# Patient Record
Sex: Female | Born: 1990 | Race: White | Hispanic: No | Marital: Single | Smoking: Never smoker
Health system: Southern US, Community
[De-identification: ages and names within clinical notes are randomized; demographics above are authoritative.]

## PROBLEM LIST (undated history)

## (undated) DIAGNOSIS — K219 Gastro-esophageal reflux disease without esophagitis: Secondary | ICD-10-CM

## (undated) DIAGNOSIS — F32A Depression, unspecified: Secondary | ICD-10-CM

## (undated) DIAGNOSIS — O219 Vomiting of pregnancy, unspecified: Secondary | ICD-10-CM

## (undated) DIAGNOSIS — F419 Anxiety disorder, unspecified: Secondary | ICD-10-CM

## (undated) DIAGNOSIS — F329 Major depressive disorder, single episode, unspecified: Secondary | ICD-10-CM

## (undated) HISTORY — DX: Gastro-esophageal reflux disease without esophagitis: K21.9

## (undated) HISTORY — PX: TONSILLECTOMY: SUR1361

## (undated) HISTORY — DX: Vomiting of pregnancy, unspecified: O21.9

## (undated) HISTORY — DX: Depression, unspecified: F32.A

## (undated) HISTORY — PX: WISDOM TOOTH EXTRACTION: SHX21

## (undated) HISTORY — DX: Anxiety disorder, unspecified: F41.9

---
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
## 2010-06-28 LAB — URINALYSIS, ROUTINE W REFLEX MICROSCOPIC
Nitrite: NEGATIVE
Specific Gravity, Urine: >1.03 — ABNORMAL HIGH (ref 1.005–1.030)
Urobilinogen, UA: 0.2 mg/dL (ref 0.0–1.0)

## 2010-06-28 LAB — PREGNANCY, URINE

## 2010-06-28 LAB — URINE MICROSCOPIC-ADD ON

## 2010-06-28 LAB — WET PREP, GENITAL

## 2010-07-01 LAB — URINALYSIS, ROUTINE W REFLEX MICROSCOPIC
Bilirubin Urine: NEGATIVE
Specific Gravity, Urine: >1.03 — ABNORMAL HIGH (ref 1.005–1.030)
Urobilinogen, UA: 0.2 mg/dL (ref 0.0–1.0)

## 2010-07-01 LAB — URINE CULTURE

## 2010-07-01 LAB — URINE MICROSCOPIC-ADD ON

## 2015-01-31 ENCOUNTER — Emergency Department (HOSPITAL_COMMUNITY)
Admission: EM | Admit: 2015-01-31 | Discharge: 2015-01-31 | Disposition: A | Discharge status: Home / Self Care | Payer: Medicaid Other | Attending: Emergency Medicine | Admitting: Emergency Medicine

## 2015-01-31 ENCOUNTER — Encounter (HOSPITAL_COMMUNITY): Payer: Self-pay | Admitting: Emergency Medicine

## 2015-01-31 DIAGNOSIS — O210 Mild hyperemesis gravidarum: Secondary | ICD-10-CM | POA: Diagnosis not present

## 2015-01-31 DIAGNOSIS — O9989 Other specified diseases and conditions complicating pregnancy, childbirth and the puerperium: Secondary | ICD-10-CM | POA: Insufficient documentation

## 2015-01-31 DIAGNOSIS — Z79899 Other long term (current) drug therapy: Secondary | ICD-10-CM | POA: Diagnosis not present

## 2015-01-31 DIAGNOSIS — Z3A09 9 weeks gestation of pregnancy: Secondary | ICD-10-CM | POA: Insufficient documentation

## 2015-01-31 DIAGNOSIS — R197 Diarrhea, unspecified: Secondary | ICD-10-CM | POA: Diagnosis not present

## 2015-01-31 LAB — URINALYSIS, ROUTINE W REFLEX MICROSCOPIC
Bilirubin Urine: NEGATIVE
Glucose, UA: NEGATIVE mg/dL
KETONES UR: 40 mg/dL — AB
NITRITE: NEGATIVE
PH: 5.5 (ref 5.0–8.0)
Protein, ur: NEGATIVE mg/dL
SPECIFIC GRAVITY, URINE: 1.01 (ref 1.005–1.030)
Urobilinogen, UA: 0.2 mg/dL (ref 0.0–1.0)

## 2015-01-31 LAB — CBC WITH DIFFERENTIAL/PLATELET
BASOS PCT: 0 %
Basophils Absolute: 0 10*3/uL (ref 0.0–0.1)
EOS ABS: 0.2 10*3/uL (ref 0.0–0.7)
Eosinophils Relative: 2 %
HCT: 41.4 % (ref 36.0–46.0)
Hemoglobin: 14.3 g/dL (ref 12.0–15.0)
Lymphocytes Relative: 21 %
Lymphs Abs: 2.8 10*3/uL (ref 0.7–4.0)
MCH: 31 pg (ref 26.0–34.0)
MCHC: 34.5 g/dL (ref 30.0–36.0)
MCV: 89.8 fL (ref 78.0–100.0)
MONO ABS: 1 10*3/uL (ref 0.1–1.0)
MONOS PCT: 8 %
NEUTROS PCT: 69 %
Neutro Abs: 9.6 10*3/uL — ABNORMAL HIGH (ref 1.7–7.7)
PLATELETS: 338 10*3/uL (ref 150–400)
RBC: 4.61 MIL/uL (ref 3.87–5.11)
RDW: 12.5 % (ref 11.5–15.5)
WBC: 13.7 10*3/uL — ABNORMAL HIGH (ref 4.0–10.5)

## 2015-01-31 LAB — COMPREHENSIVE METABOLIC PANEL
ALBUMIN: 4.6 g/dL (ref 3.5–5.0)
ALT: 12 U/L — ABNORMAL LOW (ref 14–54)
ANION GAP: 11 (ref 5–15)
AST: 17 U/L (ref 15–41)
Alkaline Phosphatase: 43 U/L (ref 38–126)
BUN: 9 mg/dL (ref 6–20)
CO2: 21 mmol/L — AB (ref 22–32)
Calcium: 9.3 mg/dL (ref 8.9–10.3)
Chloride: 105 mmol/L (ref 101–111)
Creatinine, Ser: 0.74 mg/dL (ref 0.44–1.00)
GFR calc Af Amer: >60 mL/min (ref >60)
GFR calc non Af Amer: >60 mL/min (ref >60)
GLUCOSE: 95 mg/dL (ref 65–99)
POTASSIUM: 3.2 mmol/L — AB (ref 3.5–5.1)
SODIUM: 137 mmol/L (ref 135–145)
Total Bilirubin: 0.8 mg/dL (ref 0.3–1.2)
Total Protein: 7.3 g/dL (ref 6.5–8.1)

## 2015-01-31 LAB — POC URINE PREG, ED: PREG TEST UR: POSITIVE — AB

## 2015-01-31 LAB — URINE MICROSCOPIC-ADD ON

## 2015-01-31 MED ORDER — ONDANSETRON HCL 4 MG/2ML IJ SOLN
4.0000 mg | Freq: Once | INTRAMUSCULAR | Status: AC
  Administered 2015-01-31: 4 mg via INTRAVENOUS
  Filled 2015-01-31: qty 2

## 2015-01-31 MED ORDER — PROMETHAZINE HCL 25 MG/ML IJ SOLN
12.5000 mg | Freq: Once | INTRAMUSCULAR | Status: AC
  Administered 2015-01-31: 12.5 mg via INTRAVENOUS
  Filled 2015-01-31: qty 1

## 2015-01-31 MED ORDER — PROMETHAZINE HCL 25 MG PO TABS
25.0000 mg | ORAL_TABLET | Freq: Four times a day (QID) | ORAL | Status: DC | PRN
Start: 2015-01-31 — End: 2015-02-04

## 2015-01-31 MED ORDER — SODIUM CHLORIDE 0.9 % IV BOLUS (SEPSIS)
2000.0000 mL | Freq: Once | INTRAVENOUS | Status: AC
  Administered 2015-01-31: 2000 mL via INTRAVENOUS

## 2015-01-31 NOTE — ED Notes (Signed)
Pt vomiting at this time 

## 2015-01-31 NOTE — ED Notes (Signed)
Discharge instructions given, pt demonstrated teach back and verbal understanding. No concerns voiced.  

## 2015-01-31 NOTE — Discharge Instructions (Signed)
[facility]

## 2015-01-31 NOTE — ED Provider Notes (Signed)
CSN: [id number]     Arrival date & time 01/31/15  1726 History   First MD Initiated Contact with Patient 01/31/15 1733     Chief Complaint  Patient presents with  . Emesis  . Diarrhea     (Consider location/radiation/quality/duration/timing/severity/associated sxs/prior Treatment) Patient is a 24 y.o. female presenting with vomiting and diarrhea. The history is provided by the patient (The patient states that she's been vomiting for couple days and having a little bit of diarrhea. She states she's pregnant and has not had a period for 2 months.).  Emesis Severity:  Moderate Timing:  Constant Quality:  Bilious material Able to tolerate:  Liquids Progression:  Unchanged Chronicity:  New Recent urination:  Decreased Associated symptoms: diarrhea   Associated symptoms: no abdominal pain and no headaches   Diarrhea Associated symptoms: vomiting   Associated symptoms: no abdominal pain and no headaches     History reviewed. No pertinent past medical history. Past Surgical History  Procedure Laterality Date  . Tonsillectomy     History reviewed. No pertinent family history. Social History  Substance Use Topics  . Smoking status: Never Smoker   . Smokeless tobacco: None  . Alcohol Use: No   OB History    Gravida Para Term Preterm AB TAB SAB Ectopic Multiple Living   1              Review of Systems  Constitutional: Negative for appetite change and fatigue.  HENT: Negative for congestion, ear discharge and sinus pressure.   Eyes: Negative for discharge.  Respiratory: Negative for cough.   Cardiovascular: Negative for chest pain.  Gastrointestinal: Positive for vomiting and diarrhea. Negative for abdominal pain.  Genitourinary: Negative for frequency and hematuria.  Musculoskeletal: Negative for back pain.  Skin: Negative for rash.  Neurological: Negative for seizures and headaches.  Psychiatric/Behavioral: Negative for hallucinations.      Allergies  Review of  patient's allergies indicates no known allergies.  Home Medications   Prior to Admission medications   Medication Sig Start Date End Date Taking? Authorizing Provider  ranitidine (ZANTAC) 150 MG tablet Take 150 mg by mouth 2 (two) times daily.   Yes Historical Provider, MD  promethazine (PHENERGAN) 25 MG tablet Take 1 tablet (25 mg total) by mouth every 6 (six) hours as needed for nausea or vomiting. 01/31/15   Bethann BerkshireJoseph Lavern Maslow, MD   BP 114/66 mmHg  Pulse 102  Temp(Src) 97.7 F (36.5 C) (Oral)  Resp 18  Ht 5\' 6"  (1.676 m)  Wt 130 lb (58.968 kg)  BMI 20.99 kg/m2  SpO2 98%  LMP 11/21/2014 Physical Exam  Constitutional: She is oriented to person, place, and time. She appears well-developed.  HENT:  Head: Normocephalic.  Eyes: Conjunctivae and EOM are normal. No scleral icterus.  Neck: Neck supple. No thyromegaly present.  Cardiovascular: Normal rate and regular rhythm.  Exam reveals no gallop and no friction rub.   No murmur heard. Pulmonary/Chest: No stridor. She has no wheezes. She has no rales. She exhibits no tenderness.  Abdominal: She exhibits no distension. There is no tenderness. There is no rebound.  Musculoskeletal: Normal range of motion. She exhibits no edema.  Lymphadenopathy:    She has no cervical adenopathy.  Neurological: She is oriented to person, place, and time. She exhibits normal muscle tone. Coordination normal.  Skin: No rash noted. No erythema.  Psychiatric: She has a normal mood and affect. Her behavior is normal.    ED Course  Procedures (including critical care  time) Labs Review Labs Reviewed  CBC WITH DIFFERENTIAL/PLATELET - Abnormal; Notable for the following:    WBC 13.7 (*)    Neutro Abs 9.6 (*)    All other components within normal limits  COMPREHENSIVE METABOLIC PANEL - Abnormal; Notable for the following:    Potassium 3.2 (*)    CO2 21 (*)    ALT 12 (*)    All other components within normal limits  URINALYSIS, ROUTINE W REFLEX MICROSCOPIC  (NOT AT [facility]) - Abnormal; Notable for the following:    Hgb urine dipstick TRACE (*)    Ketones, ur 40 (*)    Leukocytes, UA TRACE (*)    All other components within normal limits  URINE MICROSCOPIC-ADD ON - Abnormal; Notable for the following:    Squamous Epithelial / LPF FEW (*)    All other components within normal limits  POC URINE PREG, ED - Abnormal; Notable for the following:    Preg Test, Ur POSITIVE (*)    All other components within normal limits    Imaging Review No results found. I have personally reviewed and evaluated these images and lab results as part of my medical decision-making.   EKG Interpretation None      MDM   Final diagnoses:  Hyperemesis gravidarum    Patient is pregnant and moderately dehydrated. She has been hydrated with 2 L of normal saline and is feeling better. [facility] and was given a few Phenergan to use as needed for nausea    Bethann Berkshire, MD 01/31/15 2042

## 2015-01-31 NOTE — ED Notes (Signed)
Pt having n/d since yesterday.  Pt says she is pregnant but having not been confirmed.  Vomited 8- 10 times in last 24 hours and watery loose stools, 5 times in last 24 hours.  Denies nay pain at this time.

## 2015-01-31 NOTE — ED Notes (Signed)
MD Zammit at bedside. 

## 2015-02-04 ENCOUNTER — Emergency Department (HOSPITAL_COMMUNITY)
Admission: EM | Admit: 2015-02-04 | Discharge: 2015-02-04 | Disposition: A | Discharge status: Home / Self Care | Payer: Medicaid Other | Attending: Emergency Medicine | Admitting: Emergency Medicine

## 2015-02-04 ENCOUNTER — Encounter (HOSPITAL_COMMUNITY): Payer: Self-pay | Admitting: *Deleted

## 2015-02-04 DIAGNOSIS — O210 Mild hyperemesis gravidarum: Secondary | ICD-10-CM | POA: Diagnosis not present

## 2015-02-04 DIAGNOSIS — Z3A10 10 weeks gestation of pregnancy: Secondary | ICD-10-CM | POA: Diagnosis not present

## 2015-02-04 DIAGNOSIS — Z79899 Other long term (current) drug therapy: Secondary | ICD-10-CM | POA: Insufficient documentation

## 2015-02-04 LAB — BASIC METABOLIC PANEL
ANION GAP: 6 (ref 5–15)
BUN: 9 mg/dL (ref 6–20)
CHLORIDE: 104 mmol/L (ref 101–111)
CO2: 25 mmol/L (ref 22–32)
CREATININE: 0.71 mg/dL (ref 0.44–1.00)
Calcium: 9.3 mg/dL (ref 8.9–10.3)
GFR calc non Af Amer: >60 mL/min (ref >60)
Glucose, Bld: 99 mg/dL (ref 65–99)
POTASSIUM: 3.6 mmol/L (ref 3.5–5.1)
SODIUM: 135 mmol/L (ref 135–145)

## 2015-02-04 LAB — CBC
HEMATOCRIT: 42.5 % (ref 36.0–46.0)
HEMOGLOBIN: 14.9 g/dL (ref 12.0–15.0)
MCH: 31.7 pg (ref 26.0–34.0)
MCHC: 35.1 g/dL (ref 30.0–36.0)
MCV: 90.4 fL (ref 78.0–100.0)
Platelets: 307 10*3/uL (ref 150–400)
RBC: 4.7 MIL/uL (ref 3.87–5.11)
RDW: 12.4 % (ref 11.5–15.5)
WBC: 12.9 10*3/uL — AB (ref 4.0–10.5)

## 2015-02-04 LAB — URINALYSIS, ROUTINE W REFLEX MICROSCOPIC
Bilirubin Urine: NEGATIVE
Glucose, UA: NEGATIVE mg/dL
Ketones, ur: 15 mg/dL — AB
NITRITE: NEGATIVE
PROTEIN: NEGATIVE mg/dL
SPECIFIC GRAVITY, URINE: 1.02 (ref 1.005–1.030)
UROBILINOGEN UA: 0.2 mg/dL (ref 0.0–1.0)
pH: 8 (ref 5.0–8.0)

## 2015-02-04 LAB — PREGNANCY, URINE: PREG TEST UR: POSITIVE — AB

## 2015-02-04 LAB — URINE MICROSCOPIC-ADD ON

## 2015-02-04 MED ORDER — SODIUM CHLORIDE 0.9 % IV BOLUS (SEPSIS)
1000.0000 mL | Freq: Once | INTRAVENOUS | Status: AC
  Administered 2015-02-04: 1000 mL via INTRAVENOUS

## 2015-02-04 MED ORDER — PROMETHAZINE HCL 25 MG RE SUPP: 25.0000 mg | Freq: Four times a day (QID) | RECTAL | Status: DC | PRN

## 2015-02-04 MED ORDER — METOCLOPRAMIDE HCL 5 MG/ML IJ SOLN
10.0000 mg | Freq: Once | INTRAMUSCULAR | Status: AC
  Administered 2015-02-04: 10 mg via INTRAVENOUS
  Filled 2015-02-04: qty 2

## 2015-02-04 NOTE — Discharge Instructions (Signed)
Return to the ED with any concerns including vomiting and not able to keep down liquids, abdominal pain, vaginal bleeding, decreased level of alertness/lethargy, or any other alarming symptoms

## 2015-02-04 NOTE — ED Notes (Signed)
Vomiting onset this past week. Patient is pregnant

## 2015-02-04 NOTE — ED Notes (Signed)
Crackers and gingerale given.  Pt states she feels better.  States she is hungry.

## 2015-02-04 NOTE — ED Provider Notes (Signed)
CSN: [id number]     Arrival date & time 02/04/15  8657 History  By signing my name below, I, Soijett Blue, attest that this documentation has been prepared under the direction and in the presence of Jerelyn Scott, MD. Electronically Signed: Soijett Blue, ED Scribe. 02/04/2015. 9:39 AM.   Chief Complaint  Patient presents with  . Emesis During Pregnancy     Patient is a 24 y.o. female presenting with vomiting. The history is provided by the patient. No language interpreter was used.  Emesis Severity:  Moderate Duration:  1 week Timing:  Constant Able to tolerate:  Liquids Progression:  Unchanged Chronicity:  New Relieved by:  Nothing Worsened by:  Nothing tried Associated symptoms: no abdominal pain   Risk factors: pregnant now     HPI Comments: Jeanne Reed is a 24 y.o. female  who presents to the Emergency Department complaining of constant emesis during pregnancy onset 1 week. She thinks that she is [redacted] weeks pregnant but she has not been seen by a OB-GYN. She notes that she made an appointment with health department to be seen and then from there she plans to go to [facility]. She reports that she has been seen for her symptoms on 01/31/15 and was Rx phenergan that she has had trouble keeping down due to her nausea and vomiting. She notes that this is her first pregnancy. Patient's last menstrual period was 11/21/2014. She states that she is having associated symptoms of nausea. She states that she has tried Rx phenergan with no relief for her symptoms. She denies any other symptoms.Denies allergies to any medications.     History reviewed. No pertinent past medical history. Past Surgical History  Procedure Laterality Date  . Tonsillectomy     No family history on file. Social History  Substance Use Topics  . Smoking status: Never Smoker   . Smokeless tobacco: None  . Alcohol Use: No   OB History    Gravida Para Term Preterm AB TAB SAB Ectopic Multiple Living   1               Review of Systems  Gastrointestinal: Positive for vomiting. Negative for abdominal pain.  All other systems reviewed and are negative.     Allergies  Review of patient's allergies indicates no known allergies.  Home Medications   Prior to Admission medications   Medication Sig Start Date End Date Taking? Authorizing Provider  ranitidine (ZANTAC) 150 MG tablet Take 150 mg by mouth 2 (two) times daily.   Yes Historical Provider, MD  promethazine (PHENERGAN) 25 MG suppository Place 1 suppository (25 mg total) rectally every 6 (six) hours as needed for nausea or vomiting. 02/04/15   Jerelyn Scott, MD   BP 103/62 mmHg  Pulse 85  Temp(Src) 98.1 F (36.7 C) (Oral)  Resp 16  Ht  (1.676 m)  Wt 130 lb (58.968 kg)  BMI 20.99 kg/m2  SpO2 98%  LMP 11/21/2014  Vitals reviewed Physical Exam  Physical Examination: General appearance - alert, well appearing, and in no distress Mental status - alert, oriented to person, place, and time Eyes - no conjunctival injection no scleral icterus Mouth - mucous membranes moist, pharynx normal without lesions Chest - clear to auscultation, no wheezes, rales or rhonchi, symmetric air entry Heart - normal rate, regular rhythm, normal S1, S2, no murmurs, rubs, clicks or gallops Abdomen - soft, nontender, nondistended, no masses or organomegaly Neurological - alert, oriented, normal speech Extremities -  peripheral pulses normal, no pedal edema, no clubbing or cyanosis Skin - normal coloration and turgor, no rashes  ED Course  Procedures (including critical care time) DIAGNOSTIC STUDIES: Oxygen Saturation is 100% on RA, nl by my interpretation.    COORDINATION OF CARE: 9:36 AM Discussed treatment plan with pt at bedside which includes IV fluids, reglan injection, labs and UA and pt agreed to plan.    Labs Review Labs Reviewed  URINALYSIS, ROUTINE W REFLEX MICROSCOPIC (NOT AT [facility]) - Abnormal; Notable for the following:     APPearance HAZY (*)    Hgb urine dipstick TRACE (*)    Ketones, ur 15 (*)    Leukocytes, UA TRACE (*)    All other components within normal limits  PREGNANCY, URINE - Abnormal; Notable for the following:    Preg Test, Ur POSITIVE (*)    All other components within normal limits  CBC - Abnormal; Notable for the following:    WBC 12.9 (*)    All other components within normal limits  URINE MICROSCOPIC-ADD ON - Abnormal; Notable for the following:    Squamous Epithelial / LPF FEW (*)    Bacteria, UA FEW (*)    All other components within normal limits  BASIC METABOLIC PANEL    Imaging Review No results found. I have personally reviewed and evaluated these lab results as part of my medical decision-making.   EKG Interpretation None      MDM   Final diagnoses:  Hyperemesis gravidarum    Pt presenting with c/o vomiting, she is in early pregnancy- no abdominal pain or vaginal bleeding present.  Pt with 15 ketones in urine, no UTI.  Doubt ectopic pregnancy given no pain or bleeding.  Pt feels much improved after IV fluids and antiemetics.  Had been prescribed phenergan at last visit, but is having difficulty keeping this down, given rx for phenergan suppositories.  She states she has appointment at health department and [facility]  She was able to tolerate a po trial prior to discharge. Discharged with strict return precautions.  Pt agreeable with plan.  Julious PayerI, LINKER,MARTHA K, personally performed the services described in this documentation. All medical record entries made by the scribe were at my direction and in my presence.  I have reviewed the chart and discharge instructions and agree that the record reflects my personal performance and is accurate and complete. Ethelda ChickLINKER,MARTHA K.  02/04/2015. 12:49 PM.     Jerelyn ScottMartha Linker, MD 02/04/15 1249

## 2015-02-07 ENCOUNTER — Emergency Department (HOSPITAL_COMMUNITY)
Admission: EM | Admit: 2015-02-07 | Discharge: 2015-02-07 | Disposition: A | Discharge status: Home / Self Care | Payer: Medicaid Other | Attending: Emergency Medicine | Admitting: Emergency Medicine

## 2015-02-07 ENCOUNTER — Encounter (HOSPITAL_COMMUNITY): Payer: Self-pay

## 2015-02-07 DIAGNOSIS — R197 Diarrhea, unspecified: Secondary | ICD-10-CM | POA: Diagnosis not present

## 2015-02-07 DIAGNOSIS — O9989 Other specified diseases and conditions complicating pregnancy, childbirth and the puerperium: Secondary | ICD-10-CM | POA: Insufficient documentation

## 2015-02-07 DIAGNOSIS — O211 Hyperemesis gravidarum with metabolic disturbance: Secondary | ICD-10-CM | POA: Insufficient documentation

## 2015-02-07 DIAGNOSIS — Z3490 Encounter for supervision of normal pregnancy, unspecified, unspecified trimester: Secondary | ICD-10-CM

## 2015-02-07 DIAGNOSIS — Z3A10 10 weeks gestation of pregnancy: Secondary | ICD-10-CM | POA: Insufficient documentation

## 2015-02-07 DIAGNOSIS — Z79899 Other long term (current) drug therapy: Secondary | ICD-10-CM | POA: Diagnosis not present

## 2015-02-07 DIAGNOSIS — M549 Dorsalgia, unspecified: Secondary | ICD-10-CM | POA: Insufficient documentation

## 2015-02-07 DIAGNOSIS — O210 Mild hyperemesis gravidarum: Secondary | ICD-10-CM | POA: Diagnosis present

## 2015-02-07 DIAGNOSIS — E860 Dehydration: Secondary | ICD-10-CM

## 2015-02-07 DIAGNOSIS — R1110 Vomiting, unspecified: Secondary | ICD-10-CM

## 2015-02-07 LAB — URINALYSIS, ROUTINE W REFLEX MICROSCOPIC
Bilirubin Urine: NEGATIVE
Glucose, UA: NEGATIVE mg/dL
KETONES UR: 15 mg/dL — AB
NITRITE: NEGATIVE
PH: 6 (ref 5.0–8.0)
Protein, ur: NEGATIVE mg/dL
Specific Gravity, Urine: 1.02 (ref 1.005–1.030)
Urobilinogen, UA: 0.2 mg/dL (ref 0.0–1.0)

## 2015-02-07 LAB — COMPREHENSIVE METABOLIC PANEL
ALBUMIN: 4.6 g/dL (ref 3.5–5.0)
ALT: 13 U/L — ABNORMAL LOW (ref 14–54)
ANION GAP: 8 (ref 5–15)
AST: 15 U/L (ref 15–41)
Alkaline Phosphatase: 47 U/L (ref 38–126)
BUN: 11 mg/dL (ref 6–20)
CHLORIDE: 104 mmol/L (ref 101–111)
CO2: 25 mmol/L (ref 22–32)
Calcium: 9.4 mg/dL (ref 8.9–10.3)
Creatinine, Ser: 0.69 mg/dL (ref 0.44–1.00)
GFR calc Af Amer: >60 mL/min (ref >60)
GFR calc non Af Amer: >60 mL/min (ref >60)
Glucose, Bld: 90 mg/dL (ref 65–99)
POTASSIUM: 3.6 mmol/L (ref 3.5–5.1)
SODIUM: 137 mmol/L (ref 135–145)
TOTAL PROTEIN: 7.5 g/dL (ref 6.5–8.1)
Total Bilirubin: 0.8 mg/dL (ref 0.3–1.2)

## 2015-02-07 LAB — CBC WITH DIFFERENTIAL/PLATELET
BASOS ABS: 0 10*3/uL (ref 0.0–0.1)
BASOS PCT: 0 %
EOS ABS: 0.2 10*3/uL (ref 0.0–0.7)
Eosinophils Relative: 1 %
HCT: 42 % (ref 36.0–46.0)
Hemoglobin: 14.5 g/dL (ref 12.0–15.0)
Lymphocytes Relative: 15 %
Lymphs Abs: 2.3 10*3/uL (ref 0.7–4.0)
MCH: 31.3 pg (ref 26.0–34.0)
MCHC: 34.5 g/dL (ref 30.0–36.0)
MCV: 90.5 fL (ref 78.0–100.0)
MONO ABS: 1.2 10*3/uL — AB (ref 0.1–1.0)
MONOS PCT: 8 %
Neutro Abs: 11.2 10*3/uL — ABNORMAL HIGH (ref 1.7–7.7)
Neutrophils Relative %: 76 %
PLATELETS: 316 10*3/uL (ref 150–400)
RBC: 4.64 MIL/uL (ref 3.87–5.11)
RDW: 12.6 % (ref 11.5–15.5)
WBC: 14.9 10*3/uL — ABNORMAL HIGH (ref 4.0–10.5)

## 2015-02-07 LAB — URINE MICROSCOPIC-ADD ON

## 2015-02-07 MED ORDER — SODIUM CHLORIDE 0.9 % IV BOLUS (SEPSIS)
1000.0000 mL | Freq: Once | INTRAVENOUS | Status: AC
  Administered 2015-02-07: 1000 mL via INTRAVENOUS

## 2015-02-07 MED ORDER — PROMETHAZINE HCL 25 MG/ML IJ SOLN
12.5000 mg | Freq: Once | INTRAMUSCULAR | Status: AC
  Administered 2015-02-07: 12.5 mg via INTRAVENOUS
  Filled 2015-02-07: qty 1

## 2015-02-07 NOTE — ED Notes (Signed)
Pt reports n/v/d x 1 week.  Pt says went to the health dept today and was told she is approx [redacted] weeks pregnant.  Sent here for dehydration.

## 2015-02-07 NOTE — ED Provider Notes (Signed)
CSN: [id number]     Arrival date & time 02/07/15  1638 History  By signing my name below, I, Jeanne Reed, attest that this documentation has been prepared under the direction and in the presence of Jeanne Rhineonald Conway Fedora, MD. Electronically Signed: [name], ED Scribe. 02/07/2015. 8:29 PM.    Chief Complaint  Patient presents with  . Emesis  . Dehydration   Patient is a 24 y.o. female presenting with vomiting. The history is provided by the patient. No language interpreter was used.  Emesis Severity:  Moderate Duration:  1 week Timing:  Intermittent Able to tolerate:  Solids and liquids Progression:  Unchanged Chronicity:  Recurrent Recent urination:  Normal Associated symptoms: chills and diarrhea   Risk factors: pregnant now   Risk factors: no diabetes, no sick contacts and no travel to endemic areas    HPI Comments: Jeanne Reed is a 24 y.o. female who is [redacted] weeks pregnant and presents to the Emergency Department complaining of emesis and dehydration onset over 1 week ago. She reports associated chills and back pain. She notes she was able to eat breakfast (banana and sandwich) today without having to vomit afterwards. Pt states she has her first appointment with her OBGYN next week. She denies recent travel, sick contacts, or antibiotics. She notes that the last time she was in the ED for this (10/22) she was given phenergan with moderate relief. Pt denies vaginal bleeding and dysuria.   PMH - none Past Surgical History  Procedure Laterality Date  . Tonsillectomy     No family history on file. Social History  Substance Use Topics  . Smoking status: Never Smoker   . Smokeless tobacco: None  . Alcohol Use: No   OB History    Gravida Para Term Preterm AB TAB SAB Ectopic Multiple Living   1              Review of Systems  Constitutional: Positive for chills.  Gastrointestinal: Positive for nausea, vomiting and diarrhea.  Genitourinary: Negative for dysuria and vaginal  bleeding.  Musculoskeletal: Positive for back pain.  All other systems reviewed and are negative.   Allergies  Review of patient's allergies indicates no known allergies.  Home Medications   Prior to Admission medications   Medication Sig Start Date End Date Taking? Authorizing Provider  promethazine (PHENERGAN) 25 MG suppository Place 1 suppository (25 mg total) rectally every 6 (six) hours as needed for nausea or vomiting. 02/04/15   Jerelyn ScottMartha Linker, MD  ranitidine (ZANTAC) 150 MG tablet Take 150 mg by mouth 2 (two) times daily.    Historical Provider, MD   BP 107/64 mmHg  Pulse 111  Temp(Src) 98.1 F (36.7 C) (Oral)  Resp 20  Ht 5\' 6"  (1.676 m)  Wt 129 lb (58.514 kg)  BMI 20.83 kg/m2  SpO2 100%  LMP 11/21/2014 Physical Exam CONSTITUTIONAL: Well developed/well nourished HEAD: Normocephalic/atraumatic EYES: EOMI/PERRL ENMT: Mucous membranes moist NECK: supple no meningeal signs SPINE/BACK:entire spine nontender CV: S1/S2 noted, no murmurs/rubs/gallops noted LUNGS: Lungs are clear to auscultation bilaterally, no apparent distress ABDOMEN: soft, nontender, no rebound or guarding, bowel sounds noted throughout abdomen GU:no cva tenderness NEURO: Pt is awake/alert/appropriate, moves all extremitiesx4.  No facial droop.   EXTREMITIES: pulses normal/equal, full ROM SKIN: warm, color normal PSYCH: no abnormalities of mood noted, alert and oriented to situation  ED Course  Procedures  EMERGENCY DEPARTMENT US PREGNANCY "Study: Limited Ultrasound of the Pelvis"  INDICATIONS:Pregnancy(required) Multiple views of the uterus and pelvic  cavity are obtained with a multi-frequency probe.  APPROACH:Transabdominal   PERFORMED BY: Myself  IMAGES ARCHIVED?: Yes  LIMITATIONS: Body habitus  PREGNANCY FREE FLUID: None  PREGNANCY UTERUS FINDINGS:Gestational sac noted   PREGNANCY FINDINGS: Yolk sac noted and Fetal heart activity seen  INTERPRETATION: Viable intrauterine  pregnancy  GESTATIONAL AGE, ESTIMATE: 8 weeks      DIAGNOSTIC STUDIES: Oxygen Saturation is 100% on RA, normal by my interpretation.    COORDINATION OF CARE: 8:18 PM - Discussed lab results. Discussed plans to wait on diagnostic studies and anti-nausea medication. Pt advised of plan for treatment and pt agrees.  Labs Review Labs Reviewed  CBC WITH DIFFERENTIAL/PLATELET - Abnormal; Notable for the following:    WBC 14.9 (*)    Neutro Abs 11.2 (*)    Monocytes Absolute 1.2 (*)    All other components within normal limits  COMPREHENSIVE METABOLIC PANEL - Abnormal; Notable for the following:    ALT 13 (*)    All other components within normal limits  URINALYSIS, ROUTINE W REFLEX MICROSCOPIC (NOT AT [facility]) - Abnormal; Notable for the following:    APPearance TURBID (*)    Hgb urine dipstick SMALL (*)    Ketones, ur 15 (*)    Leukocytes, UA SMALL (*)    All other components within normal limits  URINE MICROSCOPIC-ADD ON - Abnormal; Notable for the following:    Squamous Epithelial / LPF MANY (*)    Bacteria, UA MANY (*)    All other components within normal limits  URINE CULTURE    I have personally reviewed and evaluated these lab results as part of my medical decision-making.     Pt well appearing No significant abd tenderness No vomiting or diarrhea here She feels improved with IV fluids She has OB f/u tomorrow Limited bedside US shows early IUP I feel she is safe for d/c home May need to collect stool samples at home due to persistent diarrhea >1 week Denies dysuria and urine culture sent will defer antibiotics  MDM   Final diagnoses:  Vomiting and diarrhea  Dehydration  Pregnancy    Nursing notes including past medical history and social history reviewed and considered in documentation Labs/vital reviewed myself and considered during evaluation   I personally performed the services described in this documentation, which was scribed in my presence. The  recorded information has been reviewed and is accurate.       Jeanne Rhine, MD 02/07/15 [phone]

## 2015-02-08 ENCOUNTER — Ambulatory Visit (INDEPENDENT_AMBULATORY_CARE_PROVIDER_SITE_OTHER): Payer: Medicaid Other | Admitting: Adult Health

## 2015-02-08 ENCOUNTER — Ambulatory Visit (INDEPENDENT_AMBULATORY_CARE_PROVIDER_SITE_OTHER): Payer: Medicaid Other

## 2015-02-08 ENCOUNTER — Other Ambulatory Visit (HOSPITAL_COMMUNITY)
Admission: RE | Admit: 2015-02-08 | Discharge: 2015-02-08 | Disposition: A | Discharge status: Home / Self Care | Payer: Medicaid Other | Source: Ambulatory Visit | Attending: Adult Health | Admitting: Adult Health

## 2015-02-08 ENCOUNTER — Encounter: Payer: Self-pay | Admitting: Adult Health

## 2015-02-08 VITALS — BP 110/60 | HR 86 | Wt 132.5 lb

## 2015-02-08 DIAGNOSIS — O3680X0 Pregnancy with inconclusive fetal viability, not applicable or unspecified: Secondary | ICD-10-CM | POA: Diagnosis not present

## 2015-02-08 DIAGNOSIS — Z3481 Encounter for supervision of other normal pregnancy, first trimester: Secondary | ICD-10-CM

## 2015-02-08 DIAGNOSIS — Z3400 Encounter for supervision of normal first pregnancy, unspecified trimester: Secondary | ICD-10-CM | POA: Insufficient documentation

## 2015-02-08 DIAGNOSIS — Z01411 Encounter for gynecological examination (general) (routine) with abnormal findings: Secondary | ICD-10-CM | POA: Insufficient documentation

## 2015-02-08 DIAGNOSIS — Z113 Encounter for screening for infections with a predominantly sexual mode of transmission: Secondary | ICD-10-CM | POA: Insufficient documentation

## 2015-02-08 DIAGNOSIS — Z331 Pregnant state, incidental: Secondary | ICD-10-CM

## 2015-02-08 DIAGNOSIS — Z369 Encounter for antenatal screening, unspecified: Secondary | ICD-10-CM

## 2015-02-08 DIAGNOSIS — O219 Vomiting of pregnancy, unspecified: Secondary | ICD-10-CM | POA: Diagnosis not present

## 2015-02-08 DIAGNOSIS — Z0283 Encounter for blood-alcohol and blood-drug test: Secondary | ICD-10-CM

## 2015-02-08 DIAGNOSIS — Z1389 Encounter for screening for other disorder: Secondary | ICD-10-CM

## 2015-02-08 DIAGNOSIS — Z3491 Encounter for supervision of normal pregnancy, unspecified, first trimester: Secondary | ICD-10-CM

## 2015-02-08 HISTORY — DX: Vomiting of pregnancy, unspecified: O21.9

## 2015-02-08 LAB — POCT URINALYSIS DIPSTICK
Glucose, UA: NEGATIVE
Leukocytes, UA: NEGATIVE
Nitrite, UA: NEGATIVE
Protein, UA: NEGATIVE

## 2015-02-08 NOTE — Patient Instructions (Signed)
First Trimester of Pregnancy The first trimester of pregnancy is from week 1 until the end of week 12 (months 1 through 3). A week after a sperm fertilizes an egg, the egg will implant on the wall of the uterus. This embryo will begin to develop into a baby. Genes from you and your partner are forming the baby. The female genes determine whether the baby is a boy or a girl. At 6-8 weeks, the eyes and face are formed, and the heartbeat can be seen on ultrasound. At the end of 12 weeks, all the baby's organs are formed.  Now that you are pregnant, you will want to do everything you can to have a healthy baby. Two of the most important things are to get good prenatal care and to follow your health care provider's instructions. Prenatal care is all the medical care you receive before the baby's birth. This care will help prevent, find, and treat any problems during the pregnancy and childbirth. BODY CHANGES Your body goes through many changes during pregnancy. The changes vary from woman to woman.   You may gain or lose a couple of pounds at first.  You may feel sick to your stomach (nauseous) and throw up (vomit). If the vomiting is uncontrollable, call your health care provider.  You may tire easily.  You may develop headaches that can be relieved by medicines approved by your health care provider.  You may urinate more often. Painful urination may mean you have a bladder infection.  You may develop heartburn as a result of your pregnancy.  You may develop constipation because certain hormones are causing the muscles that push waste through your intestines to slow down.  You may develop hemorrhoids or swollen, bulging veins (varicose veins).  Your breasts may begin to grow larger and become tender. Your nipples may stick out more, and the tissue that surrounds them (areola) may become darker.  Your gums may bleed and may be sensitive to brushing and flossing.  Dark spots or blotches (chloasma,  mask of pregnancy) may develop on your face. This will likely fade after the baby is born.  Your menstrual periods will stop.  You may have a loss of appetite.  You may develop cravings for certain kinds of food.  You may have changes in your emotions from day to day, such as being excited to be pregnant or being concerned that something may go wrong with the pregnancy and baby.  You may have more vivid and strange dreams.  You may have changes in your hair. These can include thickening of your hair, rapid growth, and changes in texture. Some women also have hair loss during or after pregnancy, or hair that feels dry or thin. Your hair will most likely return to normal after your baby is born. WHAT TO EXPECT AT YOUR PRENATAL VISITS During a routine prenatal visit:  You will be weighed to make sure you and the baby are growing normally.  Your blood pressure will be taken.  Your abdomen will be measured to track your baby's growth.  The fetal heartbeat will be listened to starting around week 10 or 12 of your pregnancy.  Test results from any previous visits will be discussed. Your health care provider may ask you:  How you are feeling.  If you are feeling the baby move.  If you have had any abnormal symptoms, such as leaking fluid, bleeding, severe headaches, or abdominal cramping.  If you are using any tobacco products,   including cigarettes, chewing tobacco, and electronic cigarettes.  If you have any questions. Other tests that may be performed during your first trimester include:  Blood tests to find your blood type and to check for the presence of any previous infections. They will also be used to check for low iron levels (anemia) and Rh antibodies. Later in the pregnancy, blood tests for diabetes will be done along with other tests if problems develop.  Urine tests to check for infections, diabetes, or protein in the urine.  An ultrasound to confirm the proper growth  and development of the baby.  An amniocentesis to check for possible genetic problems.  Fetal screens for spina bifida and Down syndrome.  You may need other tests to make sure you and the baby are doing well.  HIV (human immunodeficiency virus) testing. Routine prenatal testing includes screening for HIV, unless you choose not to have this test. HOME CARE INSTRUCTIONS  Medicines  Follow your health care provider's instructions regarding medicine use. Specific medicines may be either safe or unsafe to take during pregnancy.  Take your prenatal vitamins as directed.  If you develop constipation, try taking a stool softener if your health care provider approves. Diet  Eat regular, well-balanced meals. Choose a variety of foods, such as meat or vegetable-based protein, fish, milk and low-fat dairy products, vegetables, fruits, and whole grain breads and cereals. Your health care provider will help you determine the amount of weight gain that is right for you.  Avoid raw meat and uncooked cheese. These carry germs that can cause birth defects in the baby.  Eating four or five small meals rather than three large meals a day may help relieve nausea and vomiting. If you start to feel nauseous, eating a few soda crackers can be helpful. Drinking liquids between meals instead of during meals also seems to help nausea and vomiting.  If you develop constipation, eat more high-fiber foods, such as fresh vegetables or fruit and whole grains. Drink enough fluids to keep your urine clear or pale yellow. Activity and Exercise  Exercise only as directed by your health care provider. Exercising will help you:  Control your weight.  Stay in shape.  Be prepared for labor and delivery.  Experiencing pain or cramping in the lower abdomen or low back is a good sign that you should stop exercising. Check with your health care provider before continuing normal exercises.  Try to avoid standing for long  periods of time. Move your legs often if you must stand in one place for a long time.  Avoid heavy lifting.  Wear low-heeled shoes, and practice good posture.  You may continue to have sex unless your health care provider directs you otherwise. Relief of Pain or Discomfort  Wear a good support bra for breast tenderness.   Take warm sitz baths to soothe any pain or discomfort caused by hemorrhoids. Use hemorrhoid cream if your health care provider approves.   Rest with your legs elevated if you have leg cramps or low back pain.  If you develop varicose veins in your legs, wear support hose. Elevate your feet for 15 minutes, 3-4 times a day. Limit salt in your diet. Prenatal Care  Schedule your prenatal visits by the twelfth week of pregnancy. They are usually scheduled monthly at first, then more often in the last 2 months before delivery.  Write down your questions. Take them to your prenatal visits.  Keep all your prenatal visits as directed by your   health care provider. Safety  Wear your seat belt at all times when driving.  Make a list of emergency phone numbers, including numbers for family, friends, the hospital, and police and fire departments. General Tips  Ask your health care provider for a referral to a local prenatal education class. Begin classes no later than at the beginning of month 6 of your pregnancy.  Ask for help if you have counseling or nutritional needs during pregnancy. Your health care provider can offer advice or refer you to specialists for help with various needs.  Do not use hot tubs, steam rooms, or saunas.  Do not douche or use tampons or scented sanitary pads.  Do not cross your legs for long periods of time.  Avoid cat litter boxes and soil used by cats. These carry germs that can cause birth defects in the baby and possibly loss of the fetus by miscarriage or stillbirth.  Avoid all smoking, herbs, alcohol, and medicines not prescribed by  your health care provider. Chemicals in these affect the formation and growth of the baby.  Do not use any tobacco products, including cigarettes, chewing tobacco, and electronic cigarettes. If you need help quitting, ask your health care provider. You may receive counseling support and other resources to help you quit.  Schedule a dentist appointment. At home, brush your teeth with a soft toothbrush and be gentle when you floss. SEEK MEDICAL CARE IF:   You have dizziness.  You have mild pelvic cramps, pelvic pressure, or nagging pain in the abdominal area.  You have persistent nausea, vomiting, or diarrhea.  You have a bad smelling vaginal discharge.  You have pain with urination.  You notice increased swelling in your face, hands, legs, or ankles. SEEK IMMEDIATE MEDICAL CARE IF:   You have a fever.  You are leaking fluid from your vagina.  You have spotting or bleeding from your vagina.  You have severe abdominal cramping or pain.  You have rapid weight gain or loss.  You vomit blood or material that looks like coffee grounds.  You are exposed to [place] measles and have never had them.  You are exposed to fifth disease or chickenpox.  You develop a severe headache.  You have shortness of breath.  You have any kind of trauma, such as from a fall or a car accident.   This information is not intended to replace advice given to you by your health care provider. Make sure you discuss any questions you have with your health care provider.   Document Released: 03/26/2001 Document Revised: 04/22/2014 Document Reviewed: 02/09/2013 Elsevier Interactive Patient Education 2016 [employer] Eat often  Follow up in 1 week Take diclegis

## 2015-02-08 NOTE — Progress Notes (Signed)
US 7wks,single IUP w/ ys pos fht 144bpm,normal lt ov,rt ov contains a 2.3 x 2.5 x 2.1cm corpus luteal cyst,crl 10.6 mm

## 2015-02-08 NOTE — Progress Notes (Signed)
Subjective:  Jeanne Reed is a 24 y.o. G1P0 Caucasian female at 8766w0d by US being seen today for her first obstetrical visit.  Her obstetrical history is significant for smokes marijuana but counseled to stop..  Pregnancy history fully reviewed.  Patient reports nausea and vomiting.Has had some cramps. Denies vb, uti s/s, abnormal/malodorous vag d/c, or vulvovaginal itching/irritation. [facility],has phenergan supp.Has been smoking THC and it helps.  BP 110/60 mmHg  Pulse 86  Wt 132 lb 8 oz (60.102 kg)  LMP 11/21/2014  HISTORY: OB History  Gravida Para Term Preterm AB SAB TAB Ectopic Multiple Living  1             # Outcome Date GA Lbr Len/2nd Weight Sex Delivery Anes PTL Lv  1 Current              Past Medical History  Diagnosis Date  . GERD (gastroesophageal reflux disease)    Past Surgical History  Procedure Laterality Date  . Tonsillectomy    . Wisdom tooth extraction     Family History  Problem Relation Age of Onset  . Cancer Maternal Grandfather   . Diabetes Maternal Grandfather   . Cancer Paternal Grandmother   . Emphysema Paternal Grandfather     Exam   System:     General: Well developed & nourished, no acute distress   Skin: Warm & dry, normal coloration and turgor, no rashes   Neurologic: Alert & oriented, normal mood   Cardiovascular: Regular rate & rhythm   Respiratory: Effort & rate normal, LCTAB, acyanotic   Abdomen: Soft, non tender   Extremities: normal strength, tone                             Thyroid  normal Pelvic Exam:    Perineum: Normal perineum   Vulva: Normal, no lesions   Vagina:  Normal mucosa, normal discharge   Cervix: Normal, bulbous, appears closed friable with pap   Uterus: Normal size/shape/contour for GA   Thin prep pap smear with GC/CHL performed FHR: 144 via US   Assessment:   Pregnancy: G1P0 Patient Active Problem List   Diagnosis Date Noted  . Supervision of normal pregnancy in first  trimester 02/08/2015  . SCOLIOSIS-IDIOPATHIC 06/03/2007    24766w0d G1P0 New OB visit     Plan:  Initial labs drawn Continue prenatal vitamins Problem list reviewed and updated Reviewed n/v relief measures and warning s/s to report Reviewed recommended weight gain based on pre-gravid BMI Encouraged well-balanced diet Genetic Screening discussed Integrated Screen: requested Cystic fibrosis screening discussed requested Ultrasound discussed; fetal survey: requested Follow up in 1 weeks for follow with me  Will give 36 diclegis tabs take as directed Counseled to stop THC.  Adline PotterJennifer A. Griffin, NP 02/08/2015 3:34 PM

## 2015-02-09 ENCOUNTER — Emergency Department (HOSPITAL_COMMUNITY)
Admission: EM | Admit: 2015-02-09 | Discharge: 2015-02-09 | Disposition: A | Discharge status: Home / Self Care | Payer: Medicaid Other | Attending: Emergency Medicine | Admitting: Emergency Medicine

## 2015-02-09 ENCOUNTER — Telehealth: Payer: Self-pay | Admitting: Advanced Practice Midwife

## 2015-02-09 ENCOUNTER — Encounter (HOSPITAL_COMMUNITY): Payer: Self-pay | Admitting: Emergency Medicine

## 2015-02-09 DIAGNOSIS — O9989 Other specified diseases and conditions complicating pregnancy, childbirth and the puerperium: Secondary | ICD-10-CM | POA: Diagnosis not present

## 2015-02-09 DIAGNOSIS — R000 Tachycardia, unspecified: Secondary | ICD-10-CM | POA: Insufficient documentation

## 2015-02-09 DIAGNOSIS — Z79899 Other long term (current) drug therapy: Secondary | ICD-10-CM | POA: Insufficient documentation

## 2015-02-09 DIAGNOSIS — K219 Gastro-esophageal reflux disease without esophagitis: Secondary | ICD-10-CM | POA: Diagnosis not present

## 2015-02-09 DIAGNOSIS — O210 Mild hyperemesis gravidarum: Secondary | ICD-10-CM | POA: Diagnosis not present

## 2015-02-09 DIAGNOSIS — R197 Diarrhea, unspecified: Secondary | ICD-10-CM | POA: Insufficient documentation

## 2015-02-09 DIAGNOSIS — O99611 Diseases of the digestive system complicating pregnancy, first trimester: Secondary | ICD-10-CM | POA: Insufficient documentation

## 2015-02-09 DIAGNOSIS — Z3A01 Less than 8 weeks gestation of pregnancy: Secondary | ICD-10-CM | POA: Diagnosis not present

## 2015-02-09 LAB — CBC WITH DIFFERENTIAL/PLATELET
Basophils Absolute: 0 10*3/uL (ref 0.0–0.1)
Basophils Relative: 0 %
EOS ABS: 0 10*3/uL (ref 0.0–0.7)
EOS PCT: 0 %
HEMATOCRIT: 40.3 % (ref 36.0–46.0)
HEMOGLOBIN: 14 g/dL (ref 12.0–15.0)
LYMPHS ABS: 1 10*3/uL (ref 0.7–4.0)
Lymphocytes Relative: 7 %
MCH: 31.2 pg (ref 26.0–34.0)
MCHC: 34.7 g/dL (ref 30.0–36.0)
MCV: 89.8 fL (ref 78.0–100.0)
MONO ABS: 0.6 10*3/uL (ref 0.1–1.0)
MONOS PCT: 4 %
Neutro Abs: 12.3 10*3/uL — ABNORMAL HIGH (ref 1.7–7.7)
Neutrophils Relative %: 89 %
Platelets: 302 10*3/uL (ref 150–400)
RBC: 4.49 MIL/uL (ref 3.87–5.11)
RDW: 12.4 % (ref 11.5–15.5)
WBC: 13.9 10*3/uL — ABNORMAL HIGH (ref 4.0–10.5)

## 2015-02-09 LAB — BASIC METABOLIC PANEL
Anion gap: 9 (ref 5–15)
BUN: 7 mg/dL (ref 6–20)
CHLORIDE: 105 mmol/L (ref 101–111)
CO2: 23 mmol/L (ref 22–32)
CREATININE: 0.68 mg/dL (ref 0.44–1.00)
Calcium: 9.3 mg/dL (ref 8.9–10.3)
GFR calc Af Amer: >60 mL/min (ref >60)
GFR calc non Af Amer: >60 mL/min (ref >60)
Glucose, Bld: 95 mg/dL (ref 65–99)
POTASSIUM: 3.6 mmol/L (ref 3.5–5.1)
SODIUM: 137 mmol/L (ref 135–145)

## 2015-02-09 LAB — URINALYSIS, ROUTINE W REFLEX MICROSCOPIC
Bilirubin Urine: NEGATIVE
Glucose, UA: NEGATIVE mg/dL
Ketones, ur: 40 mg/dL — AB
LEUKOCYTES UA: NEGATIVE
NITRITE: NEGATIVE
PH: 6 (ref 5.0–8.0)
Protein, ur: NEGATIVE mg/dL
SPECIFIC GRAVITY, URINE: 1.025 (ref 1.005–1.030)
Urobilinogen, UA: 0.2 mg/dL (ref 0.0–1.0)

## 2015-02-09 LAB — URINE MICROSCOPIC-ADD ON

## 2015-02-09 LAB — URINE CULTURE: CULTURE: NO GROWTH

## 2015-02-09 LAB — MAGNESIUM: MAGNESIUM: 2 mg/dL (ref 1.7–2.4)

## 2015-02-09 LAB — PHOSPHORUS: Phosphorus: 3.3 mg/dL (ref 2.5–4.6)

## 2015-02-09 MED ORDER — SODIUM CHLORIDE 0.9 % IV BOLUS (SEPSIS)
1000.0000 mL | Freq: Once | INTRAVENOUS | Status: AC
  Administered 2015-02-09: 1000 mL via INTRAVENOUS

## 2015-02-09 MED ORDER — METOCLOPRAMIDE HCL 5 MG/ML IJ SOLN
10.0000 mg | Freq: Once | INTRAMUSCULAR | Status: AC
  Administered 2015-02-09: 10 mg via INTRAVENOUS
  Filled 2015-02-09: qty 2

## 2015-02-09 MED ORDER — PROMETHAZINE HCL 25 MG PO TABS: 25.0000 mg | ORAL_TABLET | Freq: Three times a day (TID) | ORAL | Status: DC | PRN

## 2015-02-09 NOTE — Telephone Encounter (Signed)
Spoke with pt. Pt has been vomiting today and is going [facility] for [facility]. JSY

## 2015-02-09 NOTE — ED Notes (Signed)
Pt eating nabs while in lobby, instructed pt not to eat or drink anything else until seen by edp,

## 2015-02-09 NOTE — ED Notes (Signed)
Pt was given [employer]

## 2015-02-09 NOTE — ED Notes (Signed)
Patient given discharge instruction, verbalized understand. IV removed, band aid applied. Patient ambulatory out of the department.  

## 2015-02-09 NOTE — ED Provider Notes (Signed)
CSN: [id number]     Arrival date & time 02/09/15  1754 History   First MD Initiated Contact with Patient 02/09/15 1858     Chief Complaint  Patient presents with  . Emesis During Pregnancy     (Consider location/radiation/quality/duration/timing/severity/associated sxs/prior Treatment) HPI  24 year old female, G1 P0 at [redacted] weeks gestational age who presents with recurrent nausea and vomiting. Reports that she has been having  Recurrent nausea and vomiting throughout this pregnancy, on his had frequent ED visits for this. As recently seen her obstetrician for this one day ago, and prescribed dicyclogic for this. Also  Taking Phenergan intermittently as well. Presents here today, due to poorly controlled nausea and vomiting again. Has had 3 episodes of loose diarrhea during this as well. Denies hematemesis, melena, hematochezia, abdominal pain, vaginal bleeding, vaginal discharge, urinary complaints, difficulty breathing, confusion, or chest pain. Has felt lightheaded, but denies syncope. Has documented IUP on OB US one day ago.   Past Medical History  Diagnosis Date  . GERD (gastroesophageal reflux disease)   . Nausea and vomiting during pregnancy 02/08/2015   Past Surgical History  Procedure Laterality Date  . Tonsillectomy    . Wisdom tooth extraction     Family History  Problem Relation Age of Onset  . Cancer Maternal Grandfather   . Diabetes Maternal Grandfather   . Cancer Paternal Grandmother   . Emphysema Paternal Grandfather    Social History  Substance Use Topics  . Smoking status: Never Smoker   . Smokeless tobacco: Never Used  . Alcohol Use: No   OB History    Gravida Para Term Preterm AB TAB SAB Ectopic Multiple Living   1              Review of Systems 10/14 systems reviewed and are negative other than those stated in the HPI   Allergies  Review of patient's allergies indicates no known allergies.  Home Medications   Prior to Admission medications    Medication Sig Start Date End Date Taking? Authorizing Provider  Prenatal Vit-Fe Fumarate-FA (MULTIVITAMIN-PRENATAL) 27-0.8 MG TABS tablet Take 1 tablet by mouth daily.    Yes Historical Provider, MD  ranitidine (ZANTAC) 150 MG tablet Take 150 mg by mouth 2 (two) times daily.   Yes Historical Provider, MD  promethazine (PHENERGAN) 25 MG suppository Place 1 suppository (25 mg total) rectally every 6 (six) hours as needed for nausea or vomiting. 02/04/15   Jerelyn Scott, MD  promethazine (PHENERGAN) 25 MG tablet Take 1 tablet (25 mg total) by mouth every 8 (eight) hours as needed for nausea or vomiting. 02/09/15   Lavera Guise, MD   BP 101/68 mmHg  Pulse 85  Temp(Src) 98.2 F (36.8 C) (Oral)  Resp 15  Ht  (1.676 m)  Wt 130 lb (58.968 kg)  BMI 20.99 kg/m2  SpO2 100%  LMP 11/21/2014 Physical Exam Physical Exam  Nursing note and vitals reviewed. Constitutional: Well developed, pale but non-toxic, and in no acute distress Head: Normocephalic and atraumatic.  Mouth/Throat: Oropharynx is clear. Mucous membranes are dry.  Neck: Normal range of motion. Neck supple.  Cardiovascular: Tachycardic rate and regular rhythm.  No edema. Pulmonary/Chest: Effort normal and breath sounds normal.  Abdominal: Soft. There is no tenderness. There is no rebound and no guarding.  Musculoskeletal: Normal range of motion.  Neurological: Alert, no facial droop, fluent speech, moves all extremities symmetrically Skin: Skin is warm and dry.  Psychiatric: Cooperative   ED Course  Procedures (including  critical care time) Labs Review Labs Reviewed  CBC WITH DIFFERENTIAL/PLATELET - Abnormal; Notable for the following:    WBC 13.9 (*)    Neutro Abs 12.3 (*)    All other components within normal limits  URINALYSIS, ROUTINE W REFLEX MICROSCOPIC (NOT AT [facility]) - Abnormal; Notable for the following:    Hgb urine dipstick TRACE (*)    Ketones, ur 40 (*)    All other components within normal limits  URINE  MICROSCOPIC-ADD ON - Abnormal; Notable for the following:    Squamous Epithelial / LPF FEW (*)    All other components within normal limits  BASIC METABOLIC PANEL  MAGNESIUM  PHOSPHORUS    Imaging Review [place] Ob Comp Less 14 Wks  02/08/2015  DATING AND VIABILITY SONOGRAM Loney LohJamie R Defoor is a 24 y.o. year old G1P0 with LMP 11/21/2014 which would correlate to  11+[redacted] weeks gestation.  She has regular menstrual cycles.   She is here today for a confirmatory initial sonogram. GESTATION: SINGLETON   FETAL ACTIVITY:          Heart rate         144 bpm         CERVIX: Appears closed ADNEXA: normal lt ov,rt ov contains a 2.3 x 2.5 x 2.1cm corpus luteal cyst,crl 10.6 mm GESTATIONAL AGE AND  BIOMETRICS: Gestational criteria: Estimated Date of Delivery: 08/28/2015 by LMP now at 11+2wks Previous Scans:0    CROWN RUMP LENGTH           10.6 mm         7 weeks                                                                      AVERAGE EGA(BY THIS SCAN):  7 weeks WORKING EDD( early ultrasound ):  09/27/2015  TECHNICIAN COMMENTS: US 7wks,single IUP w/ ys pos fht 144bpm,normal lt ov,rt ov contains a 2.3 x 2.5 x 2.1cm corpus luteal cyst,crl 10.6 mm A copy of this report including all images has been saved and backed up to a second source for retrieval if needed. All measures and details of the anatomical scan, placentation, fluid volume and pelvic anatomy are contained in that report. Amber Flora LippsJ Carl 02/08/2015 12:08 PM Clinical Impression and recommendations: I have reviewed the sonogram results above, combined with the patient's current clinical course, below are my impressions and any appropriate recommendations for management based on the sonographic findings. Viable early IUP G1P0 Estimated Date of Delivery: 09/27/15 by today's sonogram Normal general sonographic findings with physiologic corpus luteum of pregnancy Recommend routine care unless otherwise clinically indicated EURE,LUTHER H 02/08/2015 2:52 PM   I have personally  reviewed and evaluated these images and lab results as part of my medical decision-making.   EKG Interpretation   Date/Time:  Thursday February 09 2015 19:52:45 EDT Ventricular Rate:  84 PR Interval:  138 QRS Duration: 110 QT Interval:  367 QTC Calculation: 434 R Axis:   86 Text Interpretation:  Sinus rhythm No significant change since last  tracing Confirmed by LIU MD, DANA ([id number]) on 02/09/2015 8:38:17 PM      MDM   Final diagnoses:  Hyperemesis arising during pregnancy   24 year old female G1 P0 at [redacted] weeks gestational  age who presents with nausea and vomiting. She is well-appearing in no acute distress on presentation. Tachycardic in the 120s, but normotensive.  She does appear dry on exam. Abdomen is benign and the remainder of exam is unremarkable. Blood work revealing mild leukocytosis of 13, likely stress response in the setting of her hyperemesis.  History is not suggestive of infection. UA without significant ketones or evidence of infection. Blood work shows no major metabolic or electrolyte derangements. Given IV fluids and Reglan in the emergency department with some improvement of symptoms and resolution of her tachycardia. She is able to drink fluids and equal crackers here. I feel that she is appropriately stabilized for discharge home. Strict return and follow-up instructions are reviewed. She expressed understanding of all discharge instructions and felt comfortable to plan of care.   Lavera Guise, MD 02/09/15 2221

## 2015-02-09 NOTE — ED Notes (Signed)
Pt c/o of constant nausea and vomiting. Pt unable to tolerate anything PO. Pt seen 4x for same complaint per family. Pt states she is [redacted] weeks pregnant. Pt states she was seen by OB yesterday and prescribed new antiemetic.

## 2015-02-09 NOTE — Discharge Instructions (Signed)
Continue to keep well hydrated and nourished as tolerated. Return again if you are unable to keep anything down due to vomiting, have severe abdominal pain or vaginal bleeding, or any other symptoms concerning to you.   Morning Sickness Morning sickness is when you feel sick to your stomach (nauseous) during pregnancy. You may feel sick to your stomach and throw up (vomit). You may feel sick in the morning, but you can feel this way any time of day. Some women feel very sick to their stomach and cannot stop throwing up (hyperemesis gravidarum). HOME CARE  Only take medicines as told by your doctor.  Take multivitamins as told by your doctor. Taking multivitamins before getting pregnant can stop or lessen the harshness of morning sickness.  Eat dry toast or unsalted crackers before getting out of bed.  Eat 5 to 6 small meals a day.  Eat dry and bland foods like rice and baked potatoes.  Do not drink liquids with meals. Drink between meals.  Do not eat greasy, fatty, or spicy foods.  Have someone cook for you if the smell of food causes you to feel sick or throw up.  If you feel sick to your stomach after taking prenatal vitamins, take them at night or with a snack.  Eat protein when you need a snack (nuts, yogurt, cheese).  Eat unsweetened gelatins for dessert.  Wear a bracelet used for sea sickness (acupressure wristband).  Go to a doctor that puts thin needles into certain body points (acupuncture) to improve how you feel.  Do not smoke.  Use a humidifier to keep the air in your house free of odors.  Get lots of fresh air. GET HELP IF:  You need medicine to feel better.  You feel dizzy or lightheaded.  You are losing weight. GET HELP RIGHT AWAY IF:   You feel very sick to your stomach and cannot stop throwing up.  You pass out (faint). MAKE SURE YOU:  Understand these instructions.  Will watch your condition.  Will get help right away if you are not doing well  or get worse.   This information is not intended to replace advice given to you by your health care provider. Make sure you discuss any questions you have with your health care provider.   Document Released: 05/09/2004 Document Revised: 04/22/2014 Document Reviewed: 09/16/2012 Elsevier Interactive Patient Education [employer] Elsevier Inc.

## 2015-02-09 NOTE — ED Notes (Signed)
Pt eating and drinking, pt ambulatory to the bathroom

## 2015-02-10 LAB — URINE CULTURE: Organism ID, Bacteria: NO GROWTH

## 2015-02-13 LAB — CYTOLOGY - PAP

## 2015-02-14 ENCOUNTER — Telehealth: Payer: Self-pay | Admitting: Obstetrics & Gynecology

## 2015-02-14 LAB — CBC
HEMATOCRIT: 37.5 % (ref 34.0–46.6)
HEMOGLOBIN: 12.7 g/dL (ref 11.1–15.9)
MCH: 30.9 pg (ref 26.6–33.0)
MCHC: 33.9 g/dL (ref 31.5–35.7)
MCV: 91 fL (ref 79–97)
Platelets: 299 10*3/uL (ref 150–379)
RBC: 4.11 x10E6/uL (ref 3.77–5.28)
RDW: 12.9 % (ref 12.3–15.4)
WBC: 11.3 10*3/uL — AB (ref 3.4–10.8)

## 2015-02-14 LAB — URINALYSIS, ROUTINE W REFLEX MICROSCOPIC
BILIRUBIN UA: NEGATIVE
GLUCOSE, UA: NEGATIVE
Nitrite, UA: NEGATIVE
PH UA: 6 (ref 5.0–7.5)
PROTEIN UA: NEGATIVE
RBC UA: NEGATIVE
SPEC GRAV UA: 1.009 (ref 1.005–1.030)
UUROB: 0.2 mg/dL (ref 0.2–1.0)

## 2015-02-14 LAB — HIV ANTIBODY (ROUTINE TESTING W REFLEX): HIV Screen 4th Generation wRfx: NONREACTIVE

## 2015-02-14 LAB — PMP SCREEN PROFILE (10S), URINE
AMPHETAMINE SCRN UR: NEGATIVE ng/mL
BARBITURATE SCRN UR: NEGATIVE ng/mL
Benzodiazepine Screen, Urine: NEGATIVE ng/mL
COCAINE(METAB.) SCREEN, URINE: NEGATIVE ng/mL
CREATININE(CRT), U: 39.6 mg/dL (ref 20.0–300.0)
Cannabinoids Ur Ql Scn: POSITIVE ng/mL
METHADONE SCREEN, URINE: NEGATIVE ng/mL
Opiate Scrn, Ur: NEGATIVE ng/mL
Oxycodone+Oxymorphone Ur Ql Scn: NEGATIVE ng/mL
PCP Scrn, Ur: NEGATIVE ng/mL
PH UR, DRUG SCRN: 5.7 (ref 4.5–8.9)
Propoxyphene, Screen: NEGATIVE ng/mL

## 2015-02-14 LAB — VARICELLA ZOSTER ANTIBODY, IGG

## 2015-02-14 LAB — ABO/RH: Rh Factor: POSITIVE

## 2015-02-14 LAB — ANTIBODY SCREEN: Antibody Screen: NEGATIVE

## 2015-02-14 LAB — HEPATITIS B SURFACE ANTIGEN: Hepatitis B Surface Ag: NEGATIVE

## 2015-02-14 LAB — MICROSCOPIC EXAMINATION
CASTS: NONE SEEN /LPF
RBC MICROSCOPIC, UA: NONE SEEN /HPF (ref 0–?)

## 2015-02-14 LAB — RPR: RPR: NONREACTIVE

## 2015-02-14 LAB — CYSTIC FIBROSIS MUTATION 97: Interpretation: NOT DETECTED

## 2015-02-14 LAB — RUBELLA SCREEN: Rubella Antibodies, IGG: 1.71 index (ref >0.99)

## 2015-02-14 NOTE — Telephone Encounter (Signed)
Pt c/o burning with urination, bladder spasms. Pt encouraged to push lots of water or cranberry juice, keep her appt tomorrow with Cyril MourningJennifer Griffin, NP. Pt verbalized understanding.

## 2015-02-15 ENCOUNTER — Ambulatory Visit (INDEPENDENT_AMBULATORY_CARE_PROVIDER_SITE_OTHER): Payer: Self-pay | Admitting: Adult Health

## 2015-02-15 ENCOUNTER — Encounter: Payer: Self-pay | Admitting: Adult Health

## 2015-02-15 VITALS — BP 102/72 | HR 82 | Wt 129.0 lb

## 2015-02-15 DIAGNOSIS — Z3682 Encounter for antenatal screening for nuchal translucency: Secondary | ICD-10-CM

## 2015-02-15 DIAGNOSIS — Z1389 Encounter for screening for other disorder: Secondary | ICD-10-CM

## 2015-02-15 DIAGNOSIS — Z331 Pregnant state, incidental: Secondary | ICD-10-CM

## 2015-02-15 DIAGNOSIS — Z3481 Encounter for supervision of other normal pregnancy, first trimester: Secondary | ICD-10-CM

## 2015-02-15 DIAGNOSIS — Z3491 Encounter for supervision of normal pregnancy, unspecified, first trimester: Secondary | ICD-10-CM

## 2015-02-15 LAB — POCT URINALYSIS DIPSTICK
Glucose, UA: NEGATIVE
Ketones, UA: NEGATIVE
Leukocytes, UA: NEGATIVE
Nitrite, UA: NEGATIVE

## 2015-02-15 NOTE — Patient Instructions (Signed)
Take zyrtec and increase fluids  any cough drop OK Return in 4 weeks for IT/NT and see Selena BattenKim

## 2015-02-15 NOTE — Progress Notes (Signed)
G1P0 2551w0d Estimated Date of Delivery: 09/27/15  Blood pressure 102/72, pulse 82, weight 129 lb (58.514 kg), last menstrual period 11/21/2014.  Is much better with Diclegis.Has not had to go to ER for over a week. BP weight and urine results all reviewed and noted.  Please refer to the obstetrical flow sheet for the fundal height and fetal heart rate documentation:  Patient  denies any bleeding and no rupture of membranes symptoms or regular contractions,mild cramps. Patient has allergies and cough, feels like can't take deep breath.Take zyrtec and in crease fluids and continue diclegis. All questions were answered.  Orders Placed This Encounter  Procedures  . POCT Urinalysis Dipstick    Plan:  Continued routine obstetrical care,  Follow up in 4 weeks for IT/NT and see Selena BattenKim

## 2015-02-21 ENCOUNTER — Telehealth: Payer: Self-pay | Admitting: Adult Health

## 2015-02-21 MED ORDER — DOXYLAMINE-PYRIDOXINE 10-10 MG PO TBEC: DELAYED_RELEASE_TABLET | ORAL | Status: DC

## 2015-02-21 NOTE — Telephone Encounter (Signed)
Spoke with pt. Pt's Medicaid is now active and she wants nausea med sent to pharmacy. Thanks! JSY

## 2015-02-21 NOTE — Telephone Encounter (Signed)
diclegis called in

## 2015-03-15 ENCOUNTER — Ambulatory Visit (INDEPENDENT_AMBULATORY_CARE_PROVIDER_SITE_OTHER): Payer: Medicaid Other | Admitting: Women's Health

## 2015-03-15 ENCOUNTER — Encounter: Payer: Self-pay | Admitting: Women's Health

## 2015-03-15 ENCOUNTER — Ambulatory Visit (INDEPENDENT_AMBULATORY_CARE_PROVIDER_SITE_OTHER): Payer: Medicaid Other

## 2015-03-15 VITALS — BP 100/66 | HR 76 | Wt 133.0 lb

## 2015-03-15 DIAGNOSIS — Z3682 Encounter for antenatal screening for nuchal translucency: Secondary | ICD-10-CM

## 2015-03-15 DIAGNOSIS — R3989 Other symptoms and signs involving the genitourinary system: Secondary | ICD-10-CM

## 2015-03-15 DIAGNOSIS — Z3A12 12 weeks gestation of pregnancy: Secondary | ICD-10-CM | POA: Diagnosis not present

## 2015-03-15 DIAGNOSIS — Z23 Encounter for immunization: Secondary | ICD-10-CM

## 2015-03-15 DIAGNOSIS — Z331 Pregnant state, incidental: Secondary | ICD-10-CM

## 2015-03-15 DIAGNOSIS — Z36 Encounter for antenatal screening of mother: Secondary | ICD-10-CM

## 2015-03-15 DIAGNOSIS — Z0373 Encounter for suspected fetal anomaly ruled out: Secondary | ICD-10-CM | POA: Diagnosis not present

## 2015-03-15 DIAGNOSIS — Z1389 Encounter for screening for other disorder: Secondary | ICD-10-CM

## 2015-03-15 DIAGNOSIS — Z3491 Encounter for supervision of normal pregnancy, unspecified, first trimester: Secondary | ICD-10-CM

## 2015-03-15 DIAGNOSIS — Z3481 Encounter for supervision of other normal pregnancy, first trimester: Secondary | ICD-10-CM

## 2015-03-15 LAB — POCT URINALYSIS DIPSTICK
Glucose, UA: NEGATIVE
KETONES UA: NEGATIVE
Nitrite, UA: NEGATIVE
Protein, UA: NEGATIVE

## 2015-03-15 NOTE — Progress Notes (Signed)
Low-risk OB appointment G1P0 5612w0d Estimated Date of Delivery: 09/27/15 BP 100/66 mmHg  Pulse 76  Wt 133 lb (60.328 kg)  LMP 11/21/2014  BP, weight, and urine reviewed.  Refer to obstetrical flow sheet for FH & FHR.  No fm yet. Denies cramping, lof, vb. Some occ bladder pain, states she gets frequent uti's, will last for few days then go away on it's own- urine neg today- will send cx to check.  Reviewed today's normal nt u/s, warning s/s to report. Plan:  Continue routine obstetrical care  F/U in 4wks for OB appointment and 2nd IT 1st IT/NT today, flu shot today

## 2015-03-15 NOTE — Patient Instructions (Signed)

## 2015-03-15 NOTE — Progress Notes (Signed)
US 12wks,measurements c/w dates,normal ov's bilat,NB present,NT 1.534mm,crl 60.190mm,fhr 157 bpm,ant pl gr 0

## 2015-03-17 LAB — MATERNAL SCREEN, INTEGRATED #1
Crown Rump Length: 60 mm
Gest. Age on Collection Date: 12.4 weeks
MATERNAL AGE AT EDD: 24.5 a
NUCHAL TRANSLUCENCY (NT): 1.4 mm
NUMBER OF FETUSES: 1
PAPP-A Value: 1482.4 ng/mL
WEIGHT: 133 [lb_av]

## 2015-03-17 LAB — URINE CULTURE: Organism ID, Bacteria: NO GROWTH

## 2015-04-12 ENCOUNTER — Encounter: Payer: Self-pay | Admitting: Advanced Practice Midwife

## 2015-04-12 ENCOUNTER — Ambulatory Visit (INDEPENDENT_AMBULATORY_CARE_PROVIDER_SITE_OTHER): Payer: Medicaid Other | Admitting: Advanced Practice Midwife

## 2015-04-12 VITALS — BP 110/70 | HR 84 | Wt 135.0 lb

## 2015-04-12 DIAGNOSIS — Z331 Pregnant state, incidental: Secondary | ICD-10-CM

## 2015-04-12 DIAGNOSIS — Z363 Encounter for antenatal screening for malformations: Secondary | ICD-10-CM

## 2015-04-12 DIAGNOSIS — Z1389 Encounter for screening for other disorder: Secondary | ICD-10-CM

## 2015-04-12 DIAGNOSIS — Z3402 Encounter for supervision of normal first pregnancy, second trimester: Secondary | ICD-10-CM

## 2015-04-12 DIAGNOSIS — Z3A16 16 weeks gestation of pregnancy: Secondary | ICD-10-CM

## 2015-04-12 DIAGNOSIS — Z3682 Encounter for antenatal screening for nuchal translucency: Secondary | ICD-10-CM

## 2015-04-12 LAB — POCT URINALYSIS DIPSTICK
GLUCOSE UA: NEGATIVE
KETONES UA: NEGATIVE
Leukocytes, UA: NEGATIVE
NITRITE UA: NEGATIVE
Protein, UA: NEGATIVE

## 2015-04-12 NOTE — Progress Notes (Signed)
G1P0 3285w0d Estimated Date of Delivery: 09/27/15  Blood pressure 110/70, pulse 84, weight 135 lb (61.236 kg), last menstrual period 11/21/2014.   BP weight and urine results all reviewed and noted.  Please refer to the obstetrical flow sheet for the fundal height and fetal heart rate documentation:  Patient denies any bleeding and no rupture of membranes symptoms or regular contractions. Patient has intermittent urethral pain, at end of void, for years. Normally, she takes AZO for a few days and feels better.  Urine cx neg last month.   All questions were answered.  Orders Placed This Encounter  Procedures  . US OB Comp + 14 Wk  . Maternal Screen, Integrated #2  . POCT urinalysis dipstick    Plan:  Continued routine obstetrical care, 2nd IT, azo prn  Return in about 3 weeks (around 05/03/2015) for LROB, ZO:XWRUEAVS:Anatomy.

## 2015-04-15 LAB — MATERNAL SCREEN, INTEGRATED #2
ADSF: 1.14
AFP MARKER: 84.8 ng/mL
AFP MoM: 2.46
Crown Rump Length: 60 mm
DIA MOM: 1.94
DIA Value: 361.1 pg/mL
ESTRIOL UNCONJUGATED: 1.09 ng/mL
GEST. AGE ON COLLECTION DATE: 12.4 wk
Gestational Age: 16.4 weeks
HCG MOM: 2.18
Maternal Age at EDD: 24.5 years
NUCHAL TRANSLUCENCY (NT): 1.4 mm
NUCHAL TRANSLUCENCY MOM: 0.91
Number of Fetuses: 1
PAPP-A MoM: 1.36
PAPP-A Value: 1482.4 ng/mL
Test Results:: NEGATIVE
Weight: 133 [lb_av]
Weight: 133 [lb_av]
hCG Value: 75.4 IU/mL

## 2015-04-16 NOTE — L&D Delivery Note (Addendum)
Patient is 25 y.o. G1P0 5749w3d admitted in active labor  Delivery Note At 4:45 AM a viable female was delivered via Vaginal, Spontaneous Delivery (Presentation: Right Occiput Anterior).  APGAR: 8,9; weight - pending .   Placenta status: Intact, Spontaneous.  Cord: 3 vessels with the following complications: None.  Cord pH: not collected  Anesthesia: Epidural  Episiotomy: None Lacerations: None Suture Repair: na Est. Blood Loss (mL):  100 Mom to postpartum.  Baby to Couplet care / Skin to Skin.  After delivery of the infant the patient starring having a new onset dry cough with mild diaphoresis. Pulse ox was 97% with HR and BP wnl. Patient was given Benadryl 50mg  IV.   Isa RankinKimberly Niles [facility] 09/30/2015, 5:04 AM

## 2015-05-04 ENCOUNTER — Ambulatory Visit (INDEPENDENT_AMBULATORY_CARE_PROVIDER_SITE_OTHER): Payer: Medicaid Other | Admitting: Advanced Practice Midwife

## 2015-05-04 ENCOUNTER — Ambulatory Visit (INDEPENDENT_AMBULATORY_CARE_PROVIDER_SITE_OTHER): Payer: Medicaid Other

## 2015-05-04 ENCOUNTER — Encounter: Payer: Self-pay | Admitting: Advanced Practice Midwife

## 2015-05-04 VITALS — BP 110/70 | HR 77 | Wt 139.0 lb

## 2015-05-04 DIAGNOSIS — Z3402 Encounter for supervision of normal first pregnancy, second trimester: Secondary | ICD-10-CM

## 2015-05-04 DIAGNOSIS — Z3A19 19 weeks gestation of pregnancy: Secondary | ICD-10-CM | POA: Diagnosis not present

## 2015-05-04 DIAGNOSIS — Z36 Encounter for antenatal screening of mother: Secondary | ICD-10-CM

## 2015-05-04 DIAGNOSIS — Z363 Encounter for antenatal screening for malformations: Secondary | ICD-10-CM

## 2015-05-04 DIAGNOSIS — Z1389 Encounter for screening for other disorder: Secondary | ICD-10-CM

## 2015-05-04 DIAGNOSIS — Z331 Pregnant state, incidental: Secondary | ICD-10-CM

## 2015-05-04 LAB — POCT URINALYSIS DIPSTICK
GLUCOSE UA: NEGATIVE
KETONES UA: NEGATIVE
LEUKOCYTES UA: NEGATIVE
Nitrite, UA: NEGATIVE
Protein, UA: NEGATIVE

## 2015-05-04 NOTE — Progress Notes (Signed)
Pt denies any problems or concerns at this time.  

## 2015-05-04 NOTE — Progress Notes (Signed)
[place] 19+1wks,cx 3.3cm,measurements c/w dates,ant pl gr 0, svp of fluid 5.2cm,normal ov's bilat,fhr 145 bpm,efw 328g,anatomy complete,no obvious abn seen

## 2015-05-04 NOTE — Progress Notes (Signed)
G1P0 [redacted]w[redacted]d Estimated Date of Delivery: 09/27/15  Blood pressure 110/70, pulse 77, weight 139 lb (63.05 kg), last menstrual period 11/21/2014.   BP weight and urine results all reviewed and noted.  Please refer to the obstetrical flow sheet for the fundal height and fetal heart rate documentation:anatomy scan: [place] 19+1wks,cx 3.3cm,measurements c/w dates,ant pl gr 0, svp of fluid 5.2cm,normal ov's bilat,fhr 145 bpm,efw 328g,anatomy complete,no obvious abn seen  Patient reports good fetal movement, denies any bleeding and no rupture of membranes symptoms or regular contractions. Patient is without complaints. All questions were answered.  Orders Placed This Encounter  Procedures  . POCT urinalysis dipstick    Plan:  Continued routine obstetrical care,   Return in about 4 weeks (around 06/01/2015) for LROB.

## 2015-05-06 ENCOUNTER — Other Ambulatory Visit: Payer: Self-pay | Admitting: Adult Health

## 2015-06-01 ENCOUNTER — Ambulatory Visit (INDEPENDENT_AMBULATORY_CARE_PROVIDER_SITE_OTHER): Payer: Medicaid Other | Admitting: Advanced Practice Midwife

## 2015-06-01 ENCOUNTER — Encounter: Payer: Self-pay | Admitting: Advanced Practice Midwife

## 2015-06-01 VITALS — BP 110/62 | HR 112 | Wt 150.0 lb

## 2015-06-01 DIAGNOSIS — Z331 Pregnant state, incidental: Secondary | ICD-10-CM

## 2015-06-01 DIAGNOSIS — Z1389 Encounter for screening for other disorder: Secondary | ICD-10-CM

## 2015-06-01 DIAGNOSIS — Z3402 Encounter for supervision of normal first pregnancy, second trimester: Secondary | ICD-10-CM

## 2015-06-01 LAB — POCT URINALYSIS DIPSTICK
Glucose, UA: NEGATIVE
KETONES UA: NEGATIVE
Leukocytes, UA: NEGATIVE
Nitrite, UA: NEGATIVE
PROTEIN UA: NEGATIVE

## 2015-06-01 MED ORDER — ONDANSETRON 4 MG PO TBDP: 4.0000 mg | ORAL_TABLET | Freq: Three times a day (TID) | ORAL | Status: DC | PRN

## 2015-06-01 NOTE — Patient Instructions (Signed)

## 2015-06-01 NOTE — Progress Notes (Signed)
G1P0 [redacted]w[redacted]d Estimated Date of Delivery: 09/27/15  Blood pressure 110/62, pulse 112, weight 151 lb (68.493 kg), last menstrual period 11/21/2014.   BP weight and urine results all reviewed and noted.  Please refer to the obstetrical flow sheet for the fundal height and fetal heart rate documentation:  Patient reports good fetal movement, denies any bleeding and no rupture of membranes symptoms or regular contractions. Patient says that she wants to quit smoking pot, but if she doesn't, she stays sick and vomits everything.  States that she cannot keep pills down without smoking.  Takes diclegis.  Pt knows she will get a CPS referral if the baby has THC in it's umbilical cord.  All questions were answered.  Orders Placed This Encounter  Procedures  . POCT Urinalysis Dipstick    Plan:  Continued routine obstetrical care, I reweighed pt on a different scale, weight the same.  Only wearing leggings and tennis shoes! Rx zofran ODT.   Return in about 4 weeks (around 06/29/2015) for PN2/LROB.

## 2015-06-20 ENCOUNTER — Other Ambulatory Visit: Payer: Self-pay | Admitting: Adult Health

## 2015-06-28 ENCOUNTER — Encounter: Payer: Self-pay | Admitting: Women's Health

## 2015-06-28 ENCOUNTER — Ambulatory Visit (INDEPENDENT_AMBULATORY_CARE_PROVIDER_SITE_OTHER): Payer: Medicaid Other | Admitting: Women's Health

## 2015-06-28 ENCOUNTER — Other Ambulatory Visit: Payer: Medicaid Other

## 2015-06-28 VITALS — BP 110/74 | HR 60 | Wt 159.0 lb

## 2015-06-28 DIAGNOSIS — Z369 Encounter for antenatal screening, unspecified: Secondary | ICD-10-CM

## 2015-06-28 DIAGNOSIS — Z331 Pregnant state, incidental: Secondary | ICD-10-CM

## 2015-06-28 DIAGNOSIS — Z3402 Encounter for supervision of normal first pregnancy, second trimester: Secondary | ICD-10-CM

## 2015-06-28 DIAGNOSIS — Z1389 Encounter for screening for other disorder: Secondary | ICD-10-CM

## 2015-06-28 DIAGNOSIS — Z131 Encounter for screening for diabetes mellitus: Secondary | ICD-10-CM

## 2015-06-28 LAB — POCT URINALYSIS DIPSTICK
Glucose, UA: NEGATIVE
KETONES UA: NEGATIVE
LEUKOCYTES UA: NEGATIVE
NITRITE UA: NEGATIVE
PROTEIN UA: NEGATIVE
RBC UA: NEGATIVE

## 2015-06-28 NOTE — Progress Notes (Signed)
Low-risk OB appointment G1P0 3234w0d Estimated Date of Delivery: 09/27/15 BP 110/74 mmHg  Pulse 60  Wt 159 lb (72.122 kg)  LMP 11/21/2014  BP, weight, and urine reviewed.  Refer to obstetrical flow sheet for FH & FHR.  Reports good fm.  Denies regular uc's, lof, vb, or uti s/s. Reflux, taking zantac, has to sleep in recliner- can try different antacid, gave printed info on prevention/relief measures. Some RLP. Had what she thinks was spider bite on Rt lower leg couple of weeks ago, got big and turned purple, then got smaller, but just hasn't completely gone away. Has small ~0.5cm flesh-colored round raised area, no s/s infection, not tender- to keep an eye on it- if changes let us know.  Discussed weight gain, has gained 20lbs in last 8wks. To decrease carbs, increase exercise (walking/swimming, etc) Reviewed ptl s/s, fkc. Recommended Tdap at HD/PCP per CDC guidelines.  Plan:  Continue routine obstetrical care  F/U in 3wks for OB appointment  PN2 today

## 2015-06-28 NOTE — Patient Instructions (Addendum)
Call the office [phone]) or go to [facility] if:  You begin to have strong, frequent contractions  Your water breaks.  Sometimes it is a big gush of fluid, sometimes it is just a trickle that keeps getting your panties wet or running down your legs  You have vaginal bleeding.  It is normal to have a small amount of spotting if your cervix was checked.   You don't feel your baby moving like normal.  If you don't, get you something to eat and drink and lay down and focus on feeling your baby move.  You should feel at least 10 movements in 2 hours.  If you don't, you should call the office or go to [facility].    Tdap Vaccine  It is recommended that you get the Tdap vaccine during the third trimester of EACH pregnancy to help protect your baby from getting pertussis (whooping cough)  27-36 weeks is the BEST time to do this so that you can pass the protection on to your baby. During pregnancy is better than after pregnancy, but if you are unable to get it during pregnancy it will be offered at the hospital.   You can get this vaccine at the health department or your family doctor  Everyone who will be around your baby should also be up-to-date on their vaccines. Adults (who are not pregnant) only need 1 dose of Tdap during adulthood.   [place] Pediatricians/Family Doctors:  [facility] [place] [phone]                 [place] [phone] (usually not accepting new patients unless you have family there already, you are always welcome to call and ask)            [facility] ([place]) [phone]   [facility] Pediatricians/Family Doctors:   [facility]: [phone]  [facility]: [phone]    Third Trimester of Pregnancy The third trimester is from week 29 through week 42, months 7 through 9. The third trimester is a time when the  fetus is growing rapidly. At the end of the ninth month, the fetus is about 20 inches in length and weighs 6-10 pounds.  BODY CHANGES Your body goes through many changes during pregnancy. The changes vary from woman to woman.  9. Your weight will continue to increase. You can expect to gain 25-35 pounds (11-16 kg) by the end of the pregnancy. 10. You may begin to get stretch marks on your hips, abdomen, and breasts. 11. You may urinate more often because the fetus is moving lower into your pelvis and pressing on your bladder. 12. You may develop or continue to have heartburn as a result of your pregnancy. 13. You may develop constipation because certain hormones are causing the muscles that push waste through your intestines to slow down. 14. You may develop hemorrhoids or swollen, bulging veins (varicose veins). 15. You may have pelvic pain because of the weight gain and pregnancy hormones relaxing your joints between the bones in your pelvis. Backaches may result from overexertion of the muscles supporting your posture. 16. You may have changes in your hair. These can include thickening of your hair, rapid growth, and changes in texture. Some women also have hair loss during or after pregnancy, or hair that feels dry or thin. Your hair will most likely return to normal after  your baby is born. 17. Your breasts will continue to grow and be tender. A yellow discharge may leak from your breasts called colostrum. 18. Your belly button may stick out. 19. You may feel short of breath because of your expanding uterus. 20. You may notice the fetus "dropping," or moving lower in your abdomen. 21. You may have a bloody mucus discharge. This usually occurs a few days to a week before labor begins. 22. Your cervix becomes thin and soft (effaced) near your due date. WHAT TO EXPECT AT YOUR PRENATAL EXAMS  You will have prenatal exams every 2 weeks until week 36. Then, you will have weekly prenatal exams.  During a routine prenatal visit: 3. You will be weighed to make sure you and the fetus are growing normally. 4. Your blood pressure is taken. 5. Your abdomen will be measured to track your baby's growth. 6. The fetal heartbeat will be listened to. 7. Any test results from the previous visit will be discussed. 8. You may have a cervical check near your due date to see if you have effaced. At around 36 weeks, your caregiver will check your cervix. At the same time, your caregiver will also perform a test on the secretions of the vaginal tissue. This test is to determine if a type of bacteria, Group B streptococcus, is present. Your caregiver will explain this further. Your caregiver may ask you: 2. What your birth plan is. 3. How you are feeling. 4. If you are feeling the baby move. 5. If you have had any abnormal symptoms, such as leaking fluid, bleeding, severe headaches, or abdominal cramping. 6. If you have any questions. Other tests or screenings that may be performed during your third trimester include: 2. Blood tests that check for low iron levels (anemia). 3. Fetal testing to check the health, activity level, and growth of the fetus. Testing is done if you have certain medical conditions or if there are problems during the pregnancy. FALSE LABOR You may feel small, irregular contractions that eventually go away. These are called Braxton Hicks contractions, or false labor. Contractions may last for hours, days, or even weeks before true labor sets in. If contractions come at regular intervals, intensify, or become painful, it is best to be seen by your caregiver.  SIGNS OF LABOR  3. Menstrual-like cramps. 4. Contractions that are 5 minutes apart or less. 5. Contractions that start on the top of the uterus and spread down to the lower abdomen and back. 6. A sense of increased pelvic pressure or back pain. 7. A watery or bloody mucus discharge that comes from the vagina. If you have any  of these signs before the 37th week of pregnancy, call your caregiver right away. You need to go to the hospital to get checked immediately. HOME CARE INSTRUCTIONS   Avoid all smoking, herbs, alcohol, and unprescribed drugs. These chemicals affect the formation and growth of the baby.  Follow your caregiver's instructions regarding medicine use. There are medicines that are either safe or unsafe to take during pregnancy.  Exercise only as directed by your caregiver. Experiencing uterine cramps is a good sign to stop exercising.  Continue to eat regular, healthy meals.  Wear a good support bra for breast tenderness.  Do not use hot tubs, steam rooms, or saunas.  Wear your seat belt at all times when driving.  Avoid raw meat, uncooked cheese, cat litter boxes, and soil used by cats. These carry germs that can cause birth   defects in the baby.  Take your prenatal vitamins.  Try taking a stool softener (if your caregiver approves) if you develop constipation. Eat more high-fiber foods, such as fresh vegetables or fruit and whole grains. Drink plenty of fluids to keep your urine clear or pale yellow.  Take warm sitz baths to soothe any pain or discomfort caused by hemorrhoids. Use hemorrhoid cream if your caregiver approves.  If you develop varicose veins, wear support hose. Elevate your feet for 15 minutes, 3-4 times a day. Limit salt in your diet.  Avoid heavy lifting, wear low heal shoes, and practice good posture.  Rest a lot with your legs elevated if you have leg cramps or low back pain.  Visit your dentist if you have not gone during your pregnancy. Use a soft toothbrush to brush your teeth and be gentle when you floss.  A sexual relationship may be continued unless your caregiver directs you otherwise.  Do not travel far distances unless it is absolutely necessary and only with the approval of your caregiver.  Take prenatal classes to understand, practice, and ask questions  about the labor and delivery.  Make a trial run to the hospital.  Pack your hospital bag.  Prepare the baby's nursery.  Continue to go to all your prenatal visits as directed by your caregiver. SEEK MEDICAL CARE IF:  You are unsure if you are in labor or if your water has broken.  You have dizziness.  You have mild pelvic cramps, pelvic pressure, or nagging pain in your abdominal area.  You have persistent nausea, vomiting, or diarrhea.  You have a bad smelling vaginal discharge.  You have pain with urination. SEEK IMMEDIATE MEDICAL CARE IF:   You have a fever.  You are leaking fluid from your vagina.  You have spotting or bleeding from your vagina.  You have severe abdominal cramping or pain.  You have rapid weight loss or gain.  You have shortness of breath with chest pain.  You notice sudden or extreme swelling of your face, hands, ankles, feet, or legs.  You have not felt your baby move in over an hour.  You have severe headaches that do not go away with medicine.  You have vision changes. Document Released: 03/26/2001 Document Revised: 04/06/2013 Document Reviewed: 06/02/2012 [facility] Patient Information 2015 [place]. This information is not intended to replace advice given to you by your health care provider. Make sure you discuss any questions you have with your health care provider.  Heartburn During Pregnancy Heartburn is a burning sensation in the chest caused by stomach acid backing up into the esophagus. Heartburn is common in pregnancy because a certain hormone (progesterone) is released when a woman is pregnant. The progesterone hormone may relax the valve that separates the esophagus from the stomach. This allows acid to go up into the esophagus, causing heartburn. Heartburn may also happen in pregnancy because the enlarging uterus pushes up on the stomach, which pushes more acid into the esophagus. This is especially true in the later stages of  pregnancy. Heartburn problems usually go away after giving birth. CAUSES  Heartburn is caused by stomach acid backing up into the esophagus. During pregnancy, this may result from various things, including:  23. The progesterone hormone. 24. Changing hormone levels. 25. The growing uterus pushing stomach acid upward. 26. Large meals. 27. Certain foods and drinks. 28. Exercise. 29. Increased acid production. SIGNS AND SYMPTOMS  9. Burning pain in the chest or lower throat. 10.  Bitter taste in the mouth. 11. Coughing. DIAGNOSIS  Your health care provider will typically diagnose heartburn by taking a careful history of your concern. Blood tests may be done to check for a certain type of bacteria that is associated with heartburn. Sometimes, heartburn is diagnosed by prescribing a heartburn medicine to see if the symptoms improve. In some cases, a procedure called an endoscopy may be done. In this procedure, a tube with a light and a camera on the end (endoscope) is used to examine the esophagus and the stomach. TREATMENT  Treatment will vary depending on the severity of your symptoms. Your health care provider may recommend: 7. Over-the-counter medicines (antacids, acid reducers) for mild heartburn. 8. Prescription medicines to decrease stomach acid or to protect your stomach lining. 9. Certain changes in your diet. 10. Elevating the head of your bed by putting blocks under the legs. This helps prevent stomach acid from backing up into the esophagus when you are lying down. HOME CARE INSTRUCTIONS  4. Only take over-the-counter or prescription medicines as directed by your health care provider. 5. Raise the head of your bed by putting blocks under the legs if instructed to do so by your health care provider. Sleeping with more pillows is not effective because it only changes the position of your head. 6. Do not exercise right after eating. 7. Avoid eating 2-3 hours before bed. Do not lie down  right after eating. 8. Eat small meals throughout the day instead of three large meals. 9. Identify foods and beverages that make your symptoms worse and avoid them. Foods you may want to avoid include: 1. Peppers. 2. Chocolate. 3. High-fat foods, including fried foods. 4. Spicy foods. 5. Garlic and onions. 6. Citrus fruits, including oranges, grapefruit, lemons, and limes. 7. Food containing tomatoes or tomato products. 8. Mint. 9. Carbonated and caffeinated drinks. 10. Vinegar. SEEK MEDICAL CARE IF: 8. You have abdominal pain of any kind. 9. You feel burning in your upper abdomen or chest, especially after eating or lying down. 10. You have nausea and vomiting. 11. Your stomach feels upset after you eat. SEEK IMMEDIATE MEDICAL CARE IF:   You have severe chest pain that goes down your arm or into your jaw or neck.  You feel sweaty, dizzy, or light-headed.  You become short of breath.  You vomit blood.  You have difficulty or pain with swallowing.  You have bloody or black, tarry stools.  You have episodes of heartburn more than 3 times a week, for more than 2 weeks. MAKE SURE YOU:  Understand these instructions.  Will watch your condition.  Will get help right away if you are not doing well or get worse.   This information is not intended to replace advice given to you by your health care provider. Make sure you discuss any questions you have with your health care provider.   Document Released: 03/29/2000 Document Revised: 04/22/2014 Document Reviewed: 11/18/2012 Elsevier Interactive Patient Education [employer]

## 2015-06-29 LAB — RPR: RPR Ser Ql: NONREACTIVE

## 2015-06-29 LAB — ANTIBODY SCREEN: Antibody Screen: NEGATIVE

## 2015-06-29 LAB — CBC
HEMATOCRIT: 37.5 % (ref 34.0–46.6)
HEMOGLOBIN: 12.7 g/dL (ref 11.1–15.9)
MCH: 30 pg (ref 26.6–33.0)
MCHC: 33.9 g/dL (ref 31.5–35.7)
MCV: 88 fL (ref 79–97)
NRBC: 0 % (ref 0–0)
PLATELETS: 284 10*3/uL (ref 150–379)
RBC: 4.24 x10E6/uL (ref 3.77–5.28)
RDW: 13.1 % (ref 12.3–15.4)
WBC: 15.9 10*3/uL — ABNORMAL HIGH (ref 3.4–10.8)

## 2015-06-29 LAB — GLUCOSE TOLERANCE, 2 HOURS W/ 1HR
GLUCOSE, 1 HOUR: 107 mg/dL (ref 65–179)
GLUCOSE, 2 HOUR: 65 mg/dL (ref 65–152)
GLUCOSE, FASTING: 76 mg/dL (ref 65–91)

## 2015-06-29 LAB — HIV ANTIBODY (ROUTINE TESTING W REFLEX): HIV Screen 4th Generation wRfx: NONREACTIVE

## 2015-07-17 ENCOUNTER — Encounter: Payer: Self-pay | Admitting: Obstetrics & Gynecology

## 2015-07-17 ENCOUNTER — Ambulatory Visit (INDEPENDENT_AMBULATORY_CARE_PROVIDER_SITE_OTHER): Payer: Medicaid Other | Admitting: Obstetrics & Gynecology

## 2015-07-17 VITALS — BP 100/70 | HR 80 | Temp 98.4°F | Wt 159.0 lb

## 2015-07-17 DIAGNOSIS — Z331 Pregnant state, incidental: Secondary | ICD-10-CM

## 2015-07-17 DIAGNOSIS — Z1389 Encounter for screening for other disorder: Secondary | ICD-10-CM

## 2015-07-17 DIAGNOSIS — J40 Bronchitis, not specified as acute or chronic: Secondary | ICD-10-CM

## 2015-07-17 LAB — POCT URINALYSIS DIPSTICK
Blood, UA: NEGATIVE
GLUCOSE UA: NEGATIVE
Ketones, UA: NEGATIVE
Leukocytes, UA: NEGATIVE
Nitrite, UA: NEGATIVE

## 2015-07-17 MED ORDER — ALBUTEROL SULFATE HFA 108 (90 BASE) MCG/ACT IN AERS: 2.0000 | INHALATION_SPRAY | Freq: Four times a day (QID) | RESPIRATORY_TRACT | Status: DC | PRN

## 2015-07-17 MED ORDER — DEXTROMETHORPHAN-GUAIFENESIN 10-100 MG/5ML PO LIQD: 5.0000 mL | ORAL | Status: DC | PRN

## 2015-07-17 MED ORDER — AMOXICILLIN-POT CLAVULANATE 875-125 MG PO TABS: 1.0000 | ORAL_TABLET | Freq: Two times a day (BID) | ORAL | Status: DC

## 2015-07-17 MED ORDER — AZITHROMYCIN 500 MG PO TABS: 250.0000 mg | ORAL_TABLET | Freq: Once | ORAL | Status: DC

## 2015-07-17 NOTE — Progress Notes (Signed)
Work in ob appt: Pt with cough since Friday, had low grade Friday as well, none since Smokes, been a long time since she had bronchitis, no history of asthma  Exam No consolidation +wheezes and rub on right lung exam  Meds ordered this encounter  Medications  . amoxicillin-clavulanate (AUGMENTIN) 875-125 MG tablet    Sig: Take 1 tablet by mouth 2 (two) times daily.    Dispense:  20 tablet    Refill:  0  . azithromycin (ZITHROMAX) 500 MG tablet    Sig: Take 0.5 tablets (250 mg total) by mouth once.    Dispense:  10 tablet    Refill:  0  . dextromethorphan-guaiFENesin (TUSSIN DM) 10-100 MG/5ML liquid    Sig: Take 5 mLs by mouth every 4 (four) hours as needed for cough.    Dispense:  180 mL    Refill:  0  . albuterol (PROVENTIL HFA;VENTOLIN HFA) 108 (90 Base) MCG/ACT inhaler    Sig: Inhale 2 puffs into the lungs every 6 (six) hours as needed for wheezing or shortness of breath.    Dispense:  1 Inhaler    Refill:  2    follow up 1 week for re eval     Face to face time:  15 minutes  Greater than 50% of the visit time was spent in counseling and coordination of care with the patient.  The summary and outline of the counseling and care coordination is summarized in the note above.   All questions were answered.

## 2015-07-19 ENCOUNTER — Encounter: Payer: Medicaid Other | Admitting: Advanced Practice Midwife

## 2015-07-24 ENCOUNTER — Emergency Department (HOSPITAL_COMMUNITY)
Admission: EM | Admit: 2015-07-24 | Discharge: 2015-07-24 | Disposition: A | Discharge status: Home / Self Care | Payer: Medicaid Other | Attending: Emergency Medicine | Admitting: Emergency Medicine

## 2015-07-24 ENCOUNTER — Encounter (HOSPITAL_COMMUNITY): Payer: Self-pay | Admitting: *Deleted

## 2015-07-24 DIAGNOSIS — R0789 Other chest pain: Secondary | ICD-10-CM | POA: Diagnosis not present

## 2015-07-24 DIAGNOSIS — Z79899 Other long term (current) drug therapy: Secondary | ICD-10-CM | POA: Diagnosis not present

## 2015-07-24 MED ORDER — CYCLOBENZAPRINE HCL 5 MG PO TABS: 5.0000 mg | ORAL_TABLET | Freq: Three times a day (TID) | ORAL | Status: DC | PRN

## 2015-07-24 MED ORDER — HYDROCODONE-HOMATROPINE 5-1.5 MG/5ML PO SYRP: 5.0000 mL | ORAL_SOLUTION | Freq: Four times a day (QID) | ORAL | Status: DC | PRN

## 2015-07-24 NOTE — ED Notes (Signed)
Patient left before receiving discharge and prescriptions.

## 2015-07-24 NOTE — ED Provider Notes (Signed)
CSN: [id number]     Arrival date & time 07/24/15  1441 History  By signing my name below, I, Tanda RockersMargaux Venter, attest that this documentation has been prepared under the direction and in the presence of Burgess AmorJulie Julea Hutto, PA-C. Electronically Signed: Tanda RockersMargaux Venter, ED Scribe. 07/24/2015. 3:34 PM.   Chief Complaint  Patient presents with  . Rib Cage Pain    The history is provided by the patient. No language interpreter was used.     HPI Comments: Jeanne Reed is a 25 y.o. female who is [redacted] weeks pregnant presents to the Emergency Department complaining of gradual onset, intermittent, left rib pain x a couple of days. The pain is exacerbated with palpation, taking a deep breath, and coughing. She was diagnosed with possible pneumonia about 1 week ago [facility] after having a dry cough, fever, and wheezing. Pt did not have an xray done at that time and reports the the physician could not definitively say whether it was pneumonia or bronchitis. She was placed on both Amoxicillin and Erythromycin and feels improved with her breathing but her cough has persisted. She has 3 days left on both antibiotics. Pt denies any fevers at this time but still complains of intermittent coughing, mild shortness of breath, and wheezing. Pt was also given an albuterol inhaler by her OBGYN and has been taking Robitussin without relief from the cough. Pt denies leg swelling, leg pain, or any other associated symptoms. G1P0.   OBGYN - Dr. Despina HiddenEure  Past Medical History  Diagnosis Date  . GERD (gastroesophageal reflux disease)   . Nausea and vomiting during pregnancy 02/08/2015   Past Surgical History  Procedure Laterality Date  . Tonsillectomy    . Wisdom tooth extraction     Family History  Problem Relation Age of Onset  . Cancer Maternal Grandfather   . Diabetes Maternal Grandfather   . Cancer Paternal Grandmother   . Emphysema Paternal Grandfather    Social History  Substance Use Topics  . Smoking status:  Never Smoker   . Smokeless tobacco: Never Used  . Alcohol Use: No   OB History    Gravida Para Term Preterm AB TAB SAB Ectopic Multiple Living   1              Review of Systems  Constitutional: Negative for fever.  HENT: Negative for congestion and sore throat.   Eyes: Negative.   Respiratory: Positive for cough, shortness of breath and wheezing. Negative for chest tightness.   Cardiovascular: Negative for chest pain and leg swelling.  Gastrointestinal: Negative for abdominal pain.  Genitourinary: Negative.   Musculoskeletal: Positive for arthralgias (left ribs). Negative for joint swelling and neck pain.  Skin: Negative.  Negative for rash and wound.  Psychiatric/Behavioral: Negative.    Allergies  Review of patient's allergies indicates no known allergies.  Home Medications   Prior to Admission medications   Medication Sig Start Date End Date Taking? Authorizing Provider  albuterol (PROVENTIL HFA;VENTOLIN HFA) 108 (90 Base) MCG/ACT inhaler Inhale 2 puffs into the lungs every 6 (six) hours as needed for wheezing or shortness of breath. 07/17/15  Yes Lazaro ArmsLuther H Eure, MD  amoxicillin-clavulanate (AUGMENTIN) 875-125 MG tablet Take 1 tablet by mouth 2 (two) times daily. Patient not taking: Reported on 07/25/2015 07/17/15  Yes Lazaro ArmsLuther H Eure, MD  azithromycin (ZITHROMAX) 500 MG tablet Take 0.5 tablets (250 mg total) by mouth once. Patient taking differently: Take 500 mg by mouth every evening.  07/17/15  Yes Lazaro Arms, MD  DICLEGIS 10-10 MG TBEC TAKE TWO TABLETS BY MOUTH AT BEDTIME FOR 2 DAYS THEN ONE IN THE MORNING AND ONE IN THE EVENING AND TWO AT BEDTIME 06/20/15  Yes Adline Potter, NP  Pediatric Multiple Vit-C-FA (FLINSTONES GUMMIES OMEGA-3 DHA PO) Take by mouth daily.    Yes Historical Provider, MD  ranitidine (ZANTAC) 150 MG tablet Take 150 mg by mouth 2 (two) times daily.   Yes Historical Provider, MD  cyclobenzaprine (FLEXERIL) 5 MG tablet Take 1 tablet (5 mg total) by mouth 3  (three) times daily as needed for muscle spasms. 07/24/15   Burgess Amor, PA-C  HYDROcodone-homatropine (HYCODAN) 5-1.5 MG/5ML syrup Take 5 mLs by mouth every 6 (six) hours as needed for cough. Patient not taking: Reported on 07/25/2015 07/24/15   Burgess Amor, PA-C   BP 123/70 mmHg  Pulse 97  Temp(Src) 98.9 F (37.2 C) (Oral)  Resp 18  Ht  (1.676 m)  Wt 71.668 kg  BMI 25.51 kg/m2  SpO2 100%  LMP 11/21/2014   Fetal heart rate 150   Physical Exam  Constitutional: She is oriented to person, place, and time. She appears well-developed and well-nourished.  HENT:  Head: Normocephalic and atraumatic.  Right Ear: Tympanic membrane and ear canal normal.  Left Ear: Tympanic membrane and ear canal normal.  Mouth/Throat: Uvula is midline, oropharynx is clear and moist and mucous membranes are normal. No oropharyngeal exudate, posterior oropharyngeal edema, posterior oropharyngeal erythema or tonsillar abscesses.  Eyes: Conjunctivae are normal.  Cardiovascular: Normal rate, regular rhythm and normal heart sounds.   No ankle edema. No calf tenderness.   Pulmonary/Chest: Effort normal. No respiratory distress. She has no decreased breath sounds. She has no wheezes. She has no rhonchi. She has no rales.    Abdominal: Soft. There is no tenderness.  Gravid abdomen consistent with [redacted] week gestation.   Musculoskeletal: Normal range of motion.  Neurological: She is alert and oriented to person, place, and time.  Skin: Skin is warm and dry. No rash noted.  Psychiatric: She has a normal mood and affect.    ED Course  Procedures (including critical care time)  DIAGNOSTIC STUDIES: Oxygen Saturation is 100% on RA, normal by my interpretation.    COORDINATION OF CARE: 3:31 PM-Discussed treatment plan which includes consultation with Dr. Despina Hidden with pt at bedside and pt agreed to plan.   Labs Review Labs Reviewed - No data to display  Imaging Review No results found.   EKG  Interpretation None      MDM   Final diagnoses:  Left-sided chest wall pain   Pt with recent pneumonia vs bronchitis now with localized left chest wall pain, worsened with cough and palpation.  This is reproducible pain with palpation.  No sx or exam findings to suggest PE .  Spoke with Dr. Emelda Fear regarding patient who recommends flexeril and hycodan syrup for cough, chest wall pain and possible chest wall muscle spasm.  These were prescribed to her.  She left prior to getting her instructions or prescriptions.  She was contacted and will pick up her prescriptions.  Advised f/u with gyn this week for recheck if sx persist, return here for any worsened sx.  I personally performed the services described in this documentation, which was scribed in my presence. The recorded information has been reviewed and is accurate.     Burgess Amor, PA-C 07/26/15 2234  Burgess Amor, PA-C 07/26/15 1308  Donnetta Hutching, MD 07/27/15 1058

## 2015-07-24 NOTE — Discharge Instructions (Signed)
Chest Wall Pain Chest wall pain is pain in or around the bones and muscles of your chest. Sometimes, an injury causes this pain. Sometimes, the cause may not be known. This pain may take several weeks or longer to get better. HOME CARE INSTRUCTIONS  Pay attention to any changes in your symptoms. Take these actions to help with your pain:   Rest as told by your health care provider.   Avoid activities that cause pain. These include any activities that use your chest muscles or your abdominal and side muscles to lift heavy items.   If directed, apply ice to the painful area:  Put ice in a plastic bag.  Place a towel between your skin and the bag.  Leave the ice on for 20 minutes, 2-3 times per day.  Take over-the-counter and prescription medicines only as told by your health care provider.  Do not use tobacco products, including cigarettes, chewing tobacco, and e-cigarettes. If you need help quitting, ask your health care provider.  Keep all follow-up visits as told by your health care provider. This is important. SEEK MEDICAL CARE IF:  You have a fever.  Your chest pain becomes worse.  You have new symptoms. SEEK IMMEDIATE MEDICAL CARE IF:  You have nausea or vomiting.  You feel sweaty or light-headed.  You have a cough with phlegm (sputum) or you cough up blood.  You develop shortness of breath.   This information is not intended to replace advice given to you by your health care provider. Make sure you discuss any questions you have with your health care provider.   Document Released: 04/01/2005 Document Revised: 12/21/2014 Document Reviewed: 06/27/2014 Elsevier Interactive Patient Education 2016 [employer]   Use the medicines prescribed.  The hycodan prescription can make you sleepy so use caution and do not drive within 4 hours of taking this medicine.

## 2015-07-24 NOTE — ED Notes (Signed)
Pt states she was recently dx with the pneumonia and has had a bad cough. Pt is on medication for this. Pt comes in today for left rib pain. NAD noted. VS stable.

## 2015-07-25 ENCOUNTER — Encounter: Payer: Self-pay | Admitting: Obstetrics & Gynecology

## 2015-07-25 ENCOUNTER — Ambulatory Visit (INDEPENDENT_AMBULATORY_CARE_PROVIDER_SITE_OTHER): Payer: Medicaid Other | Admitting: Obstetrics & Gynecology

## 2015-07-25 VITALS — BP 100/60 | HR 80 | Wt 159.0 lb

## 2015-07-25 DIAGNOSIS — Z3403 Encounter for supervision of normal first pregnancy, third trimester: Secondary | ICD-10-CM

## 2015-07-25 DIAGNOSIS — Z331 Pregnant state, incidental: Secondary | ICD-10-CM

## 2015-07-25 DIAGNOSIS — J40 Bronchitis, not specified as acute or chronic: Secondary | ICD-10-CM

## 2015-07-25 DIAGNOSIS — Z1389 Encounter for screening for other disorder: Secondary | ICD-10-CM

## 2015-07-25 LAB — POCT URINALYSIS DIPSTICK
GLUCOSE UA: NEGATIVE
Ketones, UA: NEGATIVE
Leukocytes, UA: NEGATIVE
Nitrite, UA: NEGATIVE

## 2015-07-25 NOTE — Progress Notes (Signed)
G1P0 8077w6d Estimated Date of Delivery: 09/27/15  Blood pressure 100/60, pulse 80, weight 159 lb (72.122 kg), last menstrual period 11/21/2014.   BP weight and urine results all reviewed and noted.  Please refer to the obstetrical flow sheet for the fundal height and fetal heart rate documentation:  Patient reports good fetal movement, denies any bleeding and no rupture of membranes symptoms or regular contractions. Patient is without complaints. All questions were answered.  Orders Placed This Encounter  Procedures  . POCT urinalysis dipstick    Plan:  Continued routine obstetrical care,  Pt states she feels 75% better, no fever, still has a cuple of days of antibiotics left  Lungs clear no wheezes no rub or consolidation  Continue the inhaler  No Follow-up on file.

## 2015-08-01 ENCOUNTER — Other Ambulatory Visit: Payer: Self-pay | Admitting: Adult Health

## 2015-08-08 ENCOUNTER — Ambulatory Visit (INDEPENDENT_AMBULATORY_CARE_PROVIDER_SITE_OTHER): Payer: Medicaid Other | Admitting: Women's Health

## 2015-08-08 ENCOUNTER — Encounter: Payer: Self-pay | Admitting: Women's Health

## 2015-08-08 VITALS — BP 120/72 | HR 101 | Wt 165.0 lb

## 2015-08-08 DIAGNOSIS — O99323 Drug use complicating pregnancy, third trimester: Secondary | ICD-10-CM

## 2015-08-08 DIAGNOSIS — F1290 Cannabis use, unspecified, uncomplicated: Secondary | ICD-10-CM

## 2015-08-08 DIAGNOSIS — Z331 Pregnant state, incidental: Secondary | ICD-10-CM

## 2015-08-08 DIAGNOSIS — Z3A33 33 weeks gestation of pregnancy: Secondary | ICD-10-CM

## 2015-08-08 DIAGNOSIS — Z1389 Encounter for screening for other disorder: Secondary | ICD-10-CM

## 2015-08-08 DIAGNOSIS — F1210 Cannabis abuse, uncomplicated: Secondary | ICD-10-CM

## 2015-08-08 DIAGNOSIS — Z3403 Encounter for supervision of normal first pregnancy, third trimester: Secondary | ICD-10-CM

## 2015-08-08 LAB — POCT URINALYSIS DIPSTICK
Glucose, UA: NEGATIVE
Ketones, UA: NEGATIVE
Leukocytes, UA: NEGATIVE
NITRITE UA: NEGATIVE
Protein, UA: NEGATIVE
RBC UA: NEGATIVE

## 2015-08-08 NOTE — Progress Notes (Signed)
Pt states that she is having a lot of pain on her right side at her rib.

## 2015-08-08 NOTE — Patient Instructions (Signed)
Call the office ([phone]) or go to [facility] if:  You begin to have strong, frequent contractions  Your water breaks.  Sometimes it is a big gush of fluid, sometimes it is just a trickle that keeps getting your panties wet or running down your legs  You have vaginal bleeding.  It is normal to have a small amount of spotting if your cervix was checked.   You don't feel your baby moving like normal.  If you don't, get you something to eat and drink and lay down and focus on feeling your baby move.  You should feel at least 10 movements in 2 hours.  If you don't, you should call the office or go to [facility].    Preterm Labor Information Preterm labor is when labor starts at less than 37 weeks of pregnancy. The normal length of a pregnancy is 39 to 41 weeks. CAUSES Often, there is no identifiable underlying cause as to why a woman goes into preterm labor. One of the most common known causes of preterm labor is infection. Infections of the uterus, cervix, vagina, amniotic sac, bladder, kidney, or even the lungs (pneumonia) can cause labor to start. Other suspected causes of preterm labor include:   Urogenital infections, such as yeast infections and bacterial vaginosis.   Uterine abnormalities (uterine shape, uterine septum, fibroids, or bleeding from the placenta).   A cervix that has been operated on (it may fail to stay closed).   Malformations in the fetus.   Multiple gestations (twins, triplets, and so on).   Breakage of the amniotic sac.  RISK FACTORS  Having a previous history of preterm labor.   Having premature rupture of membranes (PROM).   Having a placenta that covers the opening of the cervix (placenta previa).   Having a placenta that separates from the uterus (placental abruption).   Having a cervix that is too weak to hold the fetus in the uterus (incompetent cervix).   Having too much fluid in the amniotic sac (polyhydramnios).   Taking  illegal drugs or smoking while pregnant.   Not gaining enough weight while pregnant.   Being younger than 18 and older than 25 years old.   Having a low socioeconomic status.   Being African American. SYMPTOMS Signs and symptoms of preterm labor include:   Menstrual-like cramps, abdominal pain, or back pain.  Uterine contractions that are regular, as frequent as six in an hour, regardless of their intensity (may be mild or painful).  Contractions that start on the top of the uterus and spread down to the lower abdomen and back.   A sense of increased pelvic pressure.   A watery or bloody mucus discharge that comes from the vagina.  TREATMENT Depending on the length of the pregnancy and other circumstances, your health care provider may suggest bed rest. If necessary, there are medicines that can be given to stop contractions and to mature the fetal lungs. If labor happens before 34 weeks of pregnancy, a prolonged hospital stay may be recommended. Treatment depends on the condition of both you and the fetus.  WHAT SHOULD YOU DO IF YOU THINK YOU ARE IN PRETERM LABOR? Call your health care provider right away. You will need to go to the hospital to get checked immediately. HOW CAN YOU PREVENT PRETERM LABOR IN FUTURE PREGNANCIES? You should:   Stop smoking if you smoke.  Maintain healthy weight gain and avoid chemicals and drugs that are not necessary.  Be watchful for   any type of infection.  Inform your health care provider if you have a known history of preterm labor.   This information is not intended to replace advice given to you by your health care provider. Make sure you discuss any questions you have with your health care provider.   Document Released: 06/22/2003 Document Revised: 12/02/2012 Document Reviewed: 05/04/2012 Elsevier Interactive Patient Education 2016 Elsevier Inc.  

## 2015-08-08 NOTE — Progress Notes (Signed)
Low-risk OB appointment G1P0 784w6d Estimated Date of Delivery: 09/27/15 BP 120/72 mmHg  Pulse 101  Wt 165 lb (74.844 kg)  LMP 11/21/2014  BP, weight, and urine reviewed.  Refer to obstetrical flow sheet for FH & FHR.  Reports good fm.  Denies regular uc's, lof, vb, or uti s/s. Pain in Rt ribs- feels like baby is pushing on something- denies n/v, not tender to touch. Discussed ways to encourage baby to move.  Reviewed ptl s/s, fkc, pn2 results. Plan:  Continue routine obstetrical care  F/U in 2wks for OB appointment

## 2015-08-10 LAB — PMP SCREEN PROFILE (10S), URINE
AMPHETAMINE SCRN UR: NEGATIVE ng/mL
Barbiturate Screen, Ur: NEGATIVE ng/mL
Benzodiazepine Screen, Urine: NEGATIVE ng/mL
CANNABINOIDS UR QL SCN: POSITIVE ng/mL
COCAINE(METAB.) SCREEN, URINE: NEGATIVE ng/mL
CREATININE(CRT), U: 141.9 mg/dL (ref 20.0–300.0)
Methadone Scn, Ur: NEGATIVE ng/mL
OPIATE SCRN UR: NEGATIVE ng/mL
OXYCODONE+OXYMORPHONE UR QL SCN: NEGATIVE ng/mL
PCP SCRN UR: NEGATIVE ng/mL
PH UR, DRUG SCRN: 6.1 (ref 4.5–8.9)
PROPOXYPHENE SCREEN: NEGATIVE ng/mL

## 2015-08-22 ENCOUNTER — Ambulatory Visit (INDEPENDENT_AMBULATORY_CARE_PROVIDER_SITE_OTHER): Payer: Medicaid Other | Admitting: Women's Health

## 2015-08-22 VITALS — BP 104/60 | HR 72 | Wt 169.0 lb

## 2015-08-22 DIAGNOSIS — O26899 Other specified pregnancy related conditions, unspecified trimester: Secondary | ICD-10-CM

## 2015-08-22 DIAGNOSIS — R109 Unspecified abdominal pain: Secondary | ICD-10-CM | POA: Diagnosis not present

## 2015-08-22 DIAGNOSIS — Z1389 Encounter for screening for other disorder: Secondary | ICD-10-CM

## 2015-08-22 DIAGNOSIS — O9989 Other specified diseases and conditions complicating pregnancy, childbirth and the puerperium: Secondary | ICD-10-CM

## 2015-08-22 DIAGNOSIS — Z331 Pregnant state, incidental: Secondary | ICD-10-CM

## 2015-08-22 DIAGNOSIS — Z3403 Encounter for supervision of normal first pregnancy, third trimester: Secondary | ICD-10-CM

## 2015-08-22 LAB — POCT WET PREP (WET MOUNT): Clue Cells Wet Prep Whiff POC: NEGATIVE

## 2015-08-22 LAB — POCT URINALYSIS DIPSTICK
Glucose, UA: NEGATIVE
Ketones, UA: NEGATIVE
Leukocytes, UA: NEGATIVE
NITRITE UA: NEGATIVE
Protein, UA: NEGATIVE
RBC UA: NEGATIVE

## 2015-08-22 NOTE — Patient Instructions (Signed)
Call the office [phone]) or go to [facility] if:  You begin to have strong, frequent contractions  Your water breaks.  Sometimes it is a big gush of fluid, sometimes it is just a trickle that keeps getting your panties wet or running down your legs  You have vaginal bleeding.  It is normal to have a small amount of spotting if your cervix was checked.   You don't feel your baby moving like normal.  If you don't, get you something to eat and drink and lay down and focus on feeling your baby move.  You should feel at least 10 movements in 2 hours.  If you don't, you should call the office or go to [facility].    Preterm Labor Information Preterm labor is when labor starts at less than 37 weeks of pregnancy. The normal length of a pregnancy is 39 to 41 weeks. CAUSES Often, there is no identifiable underlying cause as to why a woman goes into preterm labor. One of the most common known causes of preterm labor is infection. Infections of the uterus, cervix, vagina, amniotic sac, bladder, kidney, or even the lungs (pneumonia) can cause labor to start. Other suspected causes of preterm labor include:   Urogenital infections, such as yeast infections and bacterial vaginosis.   Uterine abnormalities (uterine shape, uterine septum, fibroids, or bleeding from the placenta).   A cervix that has been operated on (it may fail to stay closed).   Malformations in the fetus.   Multiple gestations (twins, triplets, and so on).   Breakage of the amniotic sac.  RISK FACTORS  Having a previous history of preterm labor.   Having premature rupture of membranes (PROM).   Having a placenta that covers the opening of the cervix (placenta previa).   Having a placenta that separates from the uterus (placental abruption).   Having a cervix that is too weak to hold the fetus in the uterus (incompetent cervix).   Having too much fluid in the amniotic sac (polyhydramnios).   Taking  illegal drugs or smoking while pregnant.   Not gaining enough weight while pregnant.   Being younger than 8018 and older than 25 years old.   Having a low socioeconomic status.   Being African American. SYMPTOMS Signs and symptoms of preterm labor include:   Menstrual-like cramps, abdominal pain, or back pain.  Uterine contractions that are regular, as frequent as six in an hour, regardless of their intensity (may be mild or painful).  Contractions that start on the top of the uterus and spread down to the lower abdomen and back.   A sense of increased pelvic pressure.   A watery or bloody mucus discharge that comes from the vagina.  TREATMENT Depending on the length of the pregnancy and other circumstances, your health care provider may suggest bed rest. If necessary, there are medicines that can be given to stop contractions and to mature the fetal lungs. If labor happens before 34 weeks of pregnancy, a prolonged hospital stay may be recommended. Treatment depends on the condition of both you and the fetus.  WHAT SHOULD YOU DO IF YOU THINK YOU ARE IN PRETERM LABOR? Call your health care provider right away. You will need to go to the hospital to get checked immediately. HOW CAN YOU PREVENT PRETERM LABOR IN FUTURE PREGNANCIES? You should:   Stop smoking if you smoke.  Maintain healthy weight gain and avoid chemicals and drugs that are not necessary.  Be watchful for  any type of infection.  Inform your health care provider if you have a known history of preterm labor.   This information is not intended to replace advice given to you by your health care provider. Make sure you discuss any questions you have with your health care provider.   Document Released: 06/22/2003 Document Revised: 12/02/2012 Document Reviewed: 05/04/2012 Elsevier Interactive Patient Education 2016 Elsevier Inc.  Heartburn During Pregnancy Heartburn is a burning sensation in the chest caused by  stomach acid backing up into the esophagus. Heartburn is common in pregnancy because a certain hormone (progesterone) is released when a woman is pregnant. The progesterone hormone may relax the valve that separates the esophagus from the stomach. This allows acid to go up into the esophagus, causing heartburn. Heartburn may also happen in pregnancy because the enlarging uterus pushes up on the stomach, which pushes more acid into the esophagus. This is especially true in the later stages of pregnancy. Heartburn problems usually go away after giving birth. CAUSES  Heartburn is caused by stomach acid backing up into the esophagus. During pregnancy, this may result from various things, including:   The progesterone hormone.  Changing hormone levels.  The growing uterus pushing stomach acid upward.  Large meals.  Certain foods and drinks.  Exercise.  Increased acid production. SIGNS AND SYMPTOMS   Burning pain in the chest or lower throat.  Bitter taste in the mouth.  Coughing. DIAGNOSIS  Your health care provider will typically diagnose heartburn by taking a careful history of your concern. Blood tests may be done to check for a certain type of bacteria that is associated with heartburn. Sometimes, heartburn is diagnosed by prescribing a heartburn medicine to see if the symptoms improve. In some cases, a procedure called an endoscopy may be done. In this procedure, a tube with a light and a camera on the end (endoscope) is used to examine the esophagus and the stomach. TREATMENT  Treatment will vary depending on the severity of your symptoms. Your health care provider may recommend:  Over-the-counter medicines (antacids, acid reducers) for mild heartburn.  Prescription medicines to decrease stomach acid or to protect your stomach lining.  Certain changes in your diet.  Elevating the head of your bed by putting blocks under the legs. This helps prevent stomach acid from backing up  into the esophagus when you are lying down. HOME CARE INSTRUCTIONS   Only take over-the-counter or prescription medicines as directed by your health care provider.  Raise the head of your bed by putting blocks under the legs if instructed to do so by your health care provider. Sleeping with more pillows is not effective because it only changes the position of your head.  Do not exercise right after eating.  Avoid eating 2-3 hours before bed. Do not lie down right after eating.  Eat small meals throughout the day instead of three large meals.  Identify foods and beverages that make your symptoms worse and avoid them. Foods you may want to avoid include:  Peppers.  Chocolate.  High-fat foods, including fried foods.  Spicy foods.  Garlic and onions.  Citrus fruits, including oranges, grapefruit, lemons, and limes.  Food containing tomatoes or tomato products.  Mint.  Carbonated and caffeinated drinks.  Vinegar. SEEK MEDICAL CARE IF:  You have abdominal pain of any kind.  You feel burning in your upper abdomen or chest, especially after eating or lying down.  You have nausea and vomiting.  Your stomach feels upset after  you eat. SEEK IMMEDIATE MEDICAL CARE IF:   You have severe chest pain that goes down your arm or into your jaw or neck.  You feel sweaty, dizzy, or light-headed.  You become short of breath.  You vomit blood.  You have difficulty or pain with swallowing.  You have bloody or black, tarry stools.  You have episodes of heartburn more than 3 times a week, for more than 2 weeks. MAKE SURE YOU:  Understand these instructions.  Will watch your condition.  Will get help right away if you are not doing well or get worse.   This information is not intended to replace advice given to you by your health care provider. Make sure you discuss any questions you have with your health care provider.   Document Released: 03/29/2000 Document Revised:  04/22/2014 Document Reviewed: 11/18/2012 Elsevier Interactive Patient Education [employer]

## 2015-08-22 NOTE — Progress Notes (Signed)
Low-risk OB appointment G1P0 9445w6d Estimated Date of Delivery: 09/27/15 BP 104/60 mmHg  Pulse 72  Wt 169 lb (76.658 kg)  LMP 11/21/2014  BP, weight, and urine reviewed.  Refer to obstetrical flow sheet for FH & FHR.  Reports good fm.  Denies regular uc's, lof, vb, or uti s/s. Cramping x 2d. Zantac not helping, friend who is also pregnant is on prilosec and she wants to try that- can get otc or offered rx- wants to get otc SVE: outer os 1, inner os closed/thick/-2, vtx    Wet prep many wbc's, otherwise neg, will send urine for gc/ct Reviewed ptl s/s, fkc. Plan:  Continue routine obstetrical care  F/U in 2wks for OB appointment

## 2015-08-23 LAB — GC/CHLAMYDIA PROBE AMP
CHLAMYDIA, DNA PROBE: NEGATIVE
NEISSERIA GONORRHOEAE BY PCR: NEGATIVE

## 2015-09-06 ENCOUNTER — Ambulatory Visit (INDEPENDENT_AMBULATORY_CARE_PROVIDER_SITE_OTHER): Payer: Medicaid Other | Admitting: Advanced Practice Midwife

## 2015-09-06 VITALS — BP 104/70 | HR 98 | Wt 176.0 lb

## 2015-09-06 DIAGNOSIS — Z3685 Encounter for antenatal screening for Streptococcus B: Secondary | ICD-10-CM

## 2015-09-06 DIAGNOSIS — Z1389 Encounter for screening for other disorder: Secondary | ICD-10-CM

## 2015-09-06 DIAGNOSIS — Z3403 Encounter for supervision of normal first pregnancy, third trimester: Secondary | ICD-10-CM

## 2015-09-06 DIAGNOSIS — Z331 Pregnant state, incidental: Secondary | ICD-10-CM

## 2015-09-06 DIAGNOSIS — Z3A37 37 weeks gestation of pregnancy: Secondary | ICD-10-CM

## 2015-09-06 LAB — POCT URINALYSIS DIPSTICK
Glucose, UA: NEGATIVE
Ketones, UA: NEGATIVE
Leukocytes, UA: NEGATIVE
NITRITE UA: NEGATIVE
RBC UA: NEGATIVE

## 2015-09-06 MED ORDER — ESOMEPRAZOLE MAGNESIUM 20 MG PO CPDR: 20.0000 mg | DELAYED_RELEASE_CAPSULE | Freq: Two times a day (BID) | ORAL | Status: DC

## 2015-09-06 NOTE — Progress Notes (Signed)
G1P0 595w0d Estimated Date of Delivery: 09/27/15  Last menstrual period 11/21/2014.   BP weight and urine results all reviewed and noted.  Please refer to the obstetrical flow sheet for the fundal height and fetal heart rate documentation:  Patient reports good fetal movement, denies any bleeding and no rupture of membranes symptoms or regular contractions. Patient has a lot of heartburn/reflux.  On Zantac now.  Has a numb area on stomach. Said was red for a few days, but not now.  No rash.  All questions were answered.  Orders Placed This Encounter  Procedures  . Strep Gp B NAA  . POCT urinalysis dipstick   Meds ordered this encounter  Medications  . esomeprazole (NEXIUM) 20 MG capsule    Sig: Take 1 capsule (20 mg total) by mouth 2 (two) times daily.    Dispense:  60 capsule    Refill:  1    Order Specific Question:  Supervising Provider    Answer:  Lazaro ArmsEURE, LUTHER H [[id number]]     Plan:  Continued routine obstetrical care,   Return in about 1 week (around 09/13/2015) for LROB.

## 2015-09-06 NOTE — Patient Instructions (Signed)

## 2015-09-08 LAB — STREP GP B NAA: Strep Gp B NAA: NEGATIVE

## 2015-09-11 ENCOUNTER — Other Ambulatory Visit: Payer: Self-pay | Admitting: Adult Health

## 2015-09-13 ENCOUNTER — Ambulatory Visit (INDEPENDENT_AMBULATORY_CARE_PROVIDER_SITE_OTHER): Payer: Medicaid Other | Admitting: Advanced Practice Midwife

## 2015-09-13 VITALS — BP 104/80 | HR 86 | Wt 174.0 lb

## 2015-09-13 DIAGNOSIS — Z3A38 38 weeks gestation of pregnancy: Secondary | ICD-10-CM

## 2015-09-13 DIAGNOSIS — Z3403 Encounter for supervision of normal first pregnancy, third trimester: Secondary | ICD-10-CM

## 2015-09-13 DIAGNOSIS — Z3493 Encounter for supervision of normal pregnancy, unspecified, third trimester: Secondary | ICD-10-CM

## 2015-09-13 NOTE — Progress Notes (Signed)
G1P0 1268w0d Estimated Date of Delivery: 09/27/15  Blood pressure 104/80, pulse 86, weight 174 lb (78.926 kg), last menstrual period 11/21/2014.   BP weight and urine results all reviewed and noted.  Please refer to the obstetrical flow sheet for the fundal height and fetal heart rate documentation:  Patient reports good fetal movement, denies any bleeding and no rupture of membranes symptoms or regular contractions. Patient is without complaints. All questions were answered.  No orders of the defined types were placed in this encounter.    Plan:  Continued routine obstetrical care,   Return in about 1 week (around 09/20/2015) for LROB.

## 2015-09-13 NOTE — Patient Instructions (Signed)

## 2015-09-20 ENCOUNTER — Ambulatory Visit (INDEPENDENT_AMBULATORY_CARE_PROVIDER_SITE_OTHER): Payer: Medicaid Other | Admitting: Women's Health

## 2015-09-20 ENCOUNTER — Encounter: Payer: Self-pay | Admitting: Women's Health

## 2015-09-20 VITALS — BP 104/58 | HR 72 | Wt 178.0 lb

## 2015-09-20 DIAGNOSIS — Z1389 Encounter for screening for other disorder: Secondary | ICD-10-CM

## 2015-09-20 DIAGNOSIS — Z3A39 39 weeks gestation of pregnancy: Secondary | ICD-10-CM

## 2015-09-20 DIAGNOSIS — Z331 Pregnant state, incidental: Secondary | ICD-10-CM

## 2015-09-20 DIAGNOSIS — Z3403 Encounter for supervision of normal first pregnancy, third trimester: Secondary | ICD-10-CM

## 2015-09-20 DIAGNOSIS — O1213 Gestational proteinuria, third trimester: Secondary | ICD-10-CM

## 2015-09-20 LAB — POCT URINALYSIS DIPSTICK
Glucose, UA: NEGATIVE
KETONES UA: NEGATIVE
LEUKOCYTES UA: NEGATIVE
Nitrite, UA: NEGATIVE
RBC UA: NEGATIVE

## 2015-09-20 NOTE — Progress Notes (Signed)
Low-risk OB appointment G1P0 1839w0d Estimated Date of Delivery: 09/27/15 BP 104/58 mmHg  Pulse 72  Wt 178 lb (80.74 kg)  LMP 11/21/2014  BP, weight, and urine reviewed.  Refer to obstetrical flow sheet for FH & FHR.  Reports good fm.  Denies regular uc's, lof, vb, or uti s/s. Lots of cramping. 1+proteinuria, bp normal- will send urine cx.  SVE per request: 1/70/-2, vtx Reviewed labor s/s, fkc. Plan:  Continue routine obstetrical care  F/U in 1wk for OB appointment

## 2015-09-20 NOTE — Patient Instructions (Signed)
Call the office ([phone]) or go to [facility] if:  You begin to have strong, frequent contractions  Your water breaks.  Sometimes it is a big gush of fluid, sometimes it is just a trickle that keeps getting your panties wet or running down your legs  You have vaginal bleeding.  It is normal to have a small amount of spotting if your cervix was checked.   You don't feel your baby moving like normal.  If you don't, get you something to eat and drink and lay down and focus on feeling your baby move.  You should feel at least 10 movements in 2 hours.  If you don't, you should call the office or go to [facility].    Braxton Hicks Contractions Contractions of the uterus can occur throughout pregnancy. Contractions are not always a sign that you are in labor.  WHAT ARE BRAXTON HICKS CONTRACTIONS?  Contractions that occur before labor are called Braxton Hicks contractions, or false labor. Toward the end of pregnancy (32-34 weeks), these contractions can develop more often and may become more forceful. This is not true labor because these contractions do not result in opening (dilatation) and thinning of the cervix. They are sometimes difficult to tell apart from true labor because these contractions can be forceful and people have different pain tolerances. You should not feel embarrassed if you go to the hospital with false labor. Sometimes, the only way to tell if you are in true labor is for your health care provider to look for changes in the cervix. If there are no prenatal problems or other health problems associated with the pregnancy, it is completely safe to be sent home with false labor and await the onset of true labor. HOW CAN YOU TELL THE DIFFERENCE BETWEEN TRUE AND FALSE LABOR? False Labor  The contractions of false labor are usually shorter and not as hard as those of true labor.   The contractions are usually irregular.   The contractions are often felt in the front of  the lower abdomen and in the groin.   The contractions may go away when you walk around or change positions while lying down.   The contractions get weaker and are shorter lasting as time goes on.   The contractions do not usually become progressively stronger, regular, and closer together as with true labor.  True Labor  Contractions in true labor last 30-70 seconds, become very regular, usually become more intense, and increase in frequency.   The contractions do not go away with walking.   The discomfort is usually felt in the top of the uterus and spreads to the lower abdomen and low back.   True labor can be determined by your health care provider with an exam. This will show that the cervix is dilating and getting thinner.  WHAT TO REMEMBER  Keep up with your usual exercises and follow other instructions given by your health care provider.   Take medicines as directed by your health care provider.   Keep your regular prenatal appointments.   Eat and drink lightly if you think you are going into labor.   If Braxton Hicks contractions are making you uncomfortable:   Change your position from lying down or resting to walking, or from walking to resting.   Sit and rest in a tub of warm water.   Drink 2-3 glasses of water. Dehydration may cause these contractions.   Do slow and deep breathing several times an hour.    WHEN SHOULD I SEEK IMMEDIATE MEDICAL CARE? Seek immediate medical care if:  Your contractions become stronger, more regular, and closer together.   You have fluid leaking or gushing from your vagina.   You have a fever.   You pass blood-tinged mucus.   You have vaginal bleeding.   You have continuous abdominal pain.   You have low back pain that you never had before.   You feel your baby's head pushing down and causing pelvic pressure.   Your baby is not moving as much as it used to.    This information is not intended to  replace advice given to you by your health care provider. Make sure you discuss any questions you have with your health care provider.   Document Released: 04/01/2005 Document Revised: 04/06/2013 Document Reviewed: 01/11/2013 Elsevier Interactive Patient Education 2016 [employer]  

## 2015-09-23 LAB — URINE CULTURE

## 2015-09-26 ENCOUNTER — Other Ambulatory Visit: Payer: Self-pay | Admitting: Women's Health

## 2015-09-26 ENCOUNTER — Telehealth: Payer: Self-pay | Admitting: *Deleted

## 2015-09-26 DIAGNOSIS — O2343 Unspecified infection of urinary tract in pregnancy, third trimester: Secondary | ICD-10-CM | POA: Insufficient documentation

## 2015-09-26 MED ORDER — PENICILLIN V POTASSIUM 500 MG PO TABS: 500.0000 mg | ORAL_TABLET | Freq: Four times a day (QID) | ORAL | Status: DC

## 2015-09-26 NOTE — Telephone Encounter (Signed)
Pt informed of + urine culture from 09/21/2015, PCN e-scribed. Pt verbalized understanding.

## 2015-09-27 ENCOUNTER — Telehealth (HOSPITAL_COMMUNITY): Payer: Self-pay | Admitting: *Deleted

## 2015-09-27 ENCOUNTER — Encounter: Payer: Self-pay | Admitting: Advanced Practice Midwife

## 2015-09-27 ENCOUNTER — Encounter (HOSPITAL_COMMUNITY): Payer: Self-pay | Admitting: *Deleted

## 2015-09-27 ENCOUNTER — Ambulatory Visit (INDEPENDENT_AMBULATORY_CARE_PROVIDER_SITE_OTHER): Payer: Medicaid Other | Admitting: Advanced Practice Midwife

## 2015-09-27 VITALS — BP 120/72 | HR 106 | Wt 181.0 lb

## 2015-09-27 DIAGNOSIS — Z3A40 40 weeks gestation of pregnancy: Secondary | ICD-10-CM

## 2015-09-27 DIAGNOSIS — Z3403 Encounter for supervision of normal first pregnancy, third trimester: Secondary | ICD-10-CM | POA: Diagnosis not present

## 2015-09-27 DIAGNOSIS — Z331 Pregnant state, incidental: Secondary | ICD-10-CM

## 2015-09-27 DIAGNOSIS — Z1389 Encounter for screening for other disorder: Secondary | ICD-10-CM

## 2015-09-27 LAB — POCT URINALYSIS DIPSTICK
Blood, UA: NEGATIVE
GLUCOSE UA: NEGATIVE
KETONES UA: NEGATIVE
Nitrite, UA: NEGATIVE

## 2015-09-27 NOTE — Telephone Encounter (Signed)
Preadmission screen  

## 2015-09-27 NOTE — Progress Notes (Signed)
G1P0 9027w0d Estimated Date of Delivery: 09/27/15  Blood pressure 120/72, pulse 106, weight 181 lb (82.101 kg), last menstrual period 11/21/2014.   BP weight and urine results all reviewed and noted.  Please refer to the obstetrical flow sheet for the fundal height and fetal heart rate documentation:  Patient reports good fetal movement, denies any bleeding and no rupture of membranes symptoms or regular contractions. Patient is without complaints. All questions were answered.  Orders Placed This Encounter  Procedures  . POCT Urinalysis Dipstick    Plan:  Continued routine obstetrical care, IOL schduled for MN next week after Foley inserted in office (only slot available)  Return in about 7 days (around 10/04/2015) for At 10:30 for LROB/NST/foley insertion.

## 2015-09-28 ENCOUNTER — Inpatient Hospital Stay (HOSPITAL_COMMUNITY)
Admission: AD | Admit: 2015-09-28 | Discharge: 2015-09-28 | Disposition: A | Discharge status: Home / Self Care | Payer: Medicaid Other | Source: Ambulatory Visit | Attending: Obstetrics & Gynecology | Admitting: Obstetrics & Gynecology

## 2015-09-28 ENCOUNTER — Encounter (HOSPITAL_COMMUNITY): Payer: Self-pay | Admitting: *Deleted

## 2015-09-28 DIAGNOSIS — Z3403 Encounter for supervision of normal first pregnancy, third trimester: Secondary | ICD-10-CM

## 2015-09-28 NOTE — Discharge Instructions (Signed)

## 2015-09-28 NOTE — MAU Note (Signed)
Patient presents at [redacted] weeks gestation with c/o contractions every 5 minutes X 2 hours. Fetus active. Denies fluid or bleeding.

## 2015-09-28 NOTE — MAU Note (Signed)
PT SAYS  UC .      INDUCTION  FOR  NEXT WED.     GBS- NEG.      VE IN OFFICE  YESTERDAY-  FAMILY TREE

## 2015-09-29 ENCOUNTER — Inpatient Hospital Stay (HOSPITAL_COMMUNITY): Payer: Medicaid Other | Admitting: Anesthesiology

## 2015-09-29 ENCOUNTER — Inpatient Hospital Stay (HOSPITAL_COMMUNITY)
Admission: AD | Admit: 2015-09-29 | Discharge: 2015-10-01 | DRG: 774 | Disposition: A | Discharge status: Home / Self Care | Payer: Medicaid Other | Source: Ambulatory Visit | Attending: Obstetrics and Gynecology | Admitting: Obstetrics and Gynecology

## 2015-09-29 ENCOUNTER — Encounter (HOSPITAL_COMMUNITY): Payer: Self-pay

## 2015-09-29 DIAGNOSIS — K219 Gastro-esophageal reflux disease without esophagitis: Secondary | ICD-10-CM | POA: Diagnosis present

## 2015-09-29 DIAGNOSIS — O9962 Diseases of the digestive system complicating childbirth: Principal | ICD-10-CM | POA: Diagnosis present

## 2015-09-29 DIAGNOSIS — O99324 Drug use complicating childbirth: Secondary | ICD-10-CM | POA: Diagnosis present

## 2015-09-29 DIAGNOSIS — Z3400 Encounter for supervision of normal first pregnancy, unspecified trimester: Secondary | ICD-10-CM

## 2015-09-29 DIAGNOSIS — Z825 Family history of asthma and other chronic lower respiratory diseases: Secondary | ICD-10-CM

## 2015-09-29 DIAGNOSIS — Z833 Family history of diabetes mellitus: Secondary | ICD-10-CM | POA: Diagnosis not present

## 2015-09-29 DIAGNOSIS — M4120 Other idiopathic scoliosis, site unspecified: Secondary | ICD-10-CM | POA: Diagnosis present

## 2015-09-29 DIAGNOSIS — O99354 Diseases of the nervous system complicating childbirth: Secondary | ICD-10-CM | POA: Diagnosis present

## 2015-09-29 DIAGNOSIS — F1290 Cannabis use, unspecified, uncomplicated: Secondary | ICD-10-CM | POA: Diagnosis present

## 2015-09-29 DIAGNOSIS — O2343 Unspecified infection of urinary tract in pregnancy, third trimester: Secondary | ICD-10-CM | POA: Diagnosis present

## 2015-09-29 DIAGNOSIS — Z3A40 40 weeks gestation of pregnancy: Secondary | ICD-10-CM | POA: Diagnosis not present

## 2015-09-29 DIAGNOSIS — B952 Enterococcus as the cause of diseases classified elsewhere: Secondary | ICD-10-CM | POA: Diagnosis present

## 2015-09-29 DIAGNOSIS — Z3403 Encounter for supervision of normal first pregnancy, third trimester: Secondary | ICD-10-CM

## 2015-09-29 DIAGNOSIS — N390 Urinary tract infection, site not specified: Secondary | ICD-10-CM | POA: Diagnosis present

## 2015-09-29 LAB — CBC
HEMATOCRIT: 34.2 % — AB (ref 36.0–46.0)
Hemoglobin: 11 g/dL — ABNORMAL LOW (ref 12.0–15.0)
MCH: 24.4 pg — ABNORMAL LOW (ref 26.0–34.0)
MCHC: 32.2 g/dL (ref 30.0–36.0)
MCV: 75.8 fL — AB (ref 78.0–100.0)
PLATELETS: 366 10*3/uL (ref 150–400)
RBC: 4.51 MIL/uL (ref 3.87–5.11)
RDW: 15.4 % (ref 11.5–15.5)
WBC: 21.5 10*3/uL — AB (ref 4.0–10.5)

## 2015-09-29 LAB — TYPE AND SCREEN
ABO/RH(D): O POS
Antibody Screen: NEGATIVE

## 2015-09-29 LAB — ABO/RH: ABO/RH(D): O POS

## 2015-09-29 MED ORDER — OXYCODONE-ACETAMINOPHEN 5-325 MG PO TABS: 1.0000 | ORAL_TABLET | ORAL | Status: DC | PRN

## 2015-09-29 MED ORDER — LACTATED RINGERS IV SOLN: 500.0000 mL | Freq: Once | INTRAVENOUS | Status: DC

## 2015-09-29 MED ORDER — OXYCODONE-ACETAMINOPHEN 5-325 MG PO TABS: 2.0000 | ORAL_TABLET | ORAL | Status: DC | PRN

## 2015-09-29 MED ORDER — FENTANYL CITRATE (PF) 100 MCG/2ML IJ SOLN
100.0000 ug | Freq: Once | INTRAMUSCULAR | Status: AC
  Administered 2015-09-29: 100 ug via INTRAVENOUS
  Filled 2015-09-29: qty 2

## 2015-09-29 MED ORDER — EPHEDRINE 5 MG/ML INJ
10.0000 mg | INTRAVENOUS | Status: DC | PRN
  Filled 2015-09-29: qty 2

## 2015-09-29 MED ORDER — DIPHENHYDRAMINE HCL 50 MG/ML IJ SOLN
12.5000 mg | INTRAMUSCULAR | Status: DC | PRN
  Administered 2015-09-30: 12.5 mg via INTRAVENOUS
  Filled 2015-09-29 (×2): qty 1

## 2015-09-29 MED ORDER — OXYTOCIN BOLUS FROM INFUSION
500.0000 mL | INTRAVENOUS | Status: DC
  Administered 2015-09-30: 500 mL via INTRAVENOUS

## 2015-09-29 MED ORDER — PENICILLIN G POTASSIUM 5000000 UNITS IJ SOLR
5.0000 10*6.[IU] | Freq: Once | INTRAVENOUS | Status: DC
  Administered 2015-09-29: 5 10*6.[IU] via INTRAVENOUS
  Filled 2015-09-29: qty 5

## 2015-09-29 MED ORDER — PENICILLIN G POTASSIUM 5000000 UNITS IJ SOLR
5.0000 10*6.[IU] | Freq: Four times a day (QID) | INTRAVENOUS | Status: DC
  Filled 2015-09-29: qty 5

## 2015-09-29 MED ORDER — LIDOCAINE HCL (PF) 1 % IJ SOLN
30.0000 mL | INTRAMUSCULAR | Status: DC | PRN
  Filled 2015-09-29: qty 30

## 2015-09-29 MED ORDER — FLEET ENEMA 7-19 GM/118ML RE ENEM: 1.0000 | ENEMA | RECTAL | Status: DC | PRN

## 2015-09-29 MED ORDER — FENTANYL CITRATE (PF) 100 MCG/2ML IJ SOLN
100.0000 ug | INTRAMUSCULAR | Status: DC | PRN
  Administered 2015-09-29: 100 ug via INTRAVENOUS
  Filled 2015-09-29: qty 2

## 2015-09-29 MED ORDER — PHENYLEPHRINE 40 MCG/ML (10ML) SYRINGE FOR IV PUSH (FOR BLOOD PRESSURE SUPPORT)
80.0000 ug | PREFILLED_SYRINGE | INTRAVENOUS | Status: DC | PRN
  Filled 2015-09-29: qty 5
  Filled 2015-09-29: qty 10

## 2015-09-29 MED ORDER — OXYTOCIN 40 UNITS IN LACTATED RINGERS INFUSION - SIMPLE MED
2.5000 [IU]/h | INTRAVENOUS | Status: DC
  Filled 2015-09-29: qty 1000

## 2015-09-29 MED ORDER — SOD CITRATE-CITRIC ACID 500-334 MG/5ML PO SOLN
30.0000 mL | ORAL | Status: DC | PRN
Start: 2015-09-29 — End: 2015-09-30
  Administered 2015-09-29: 30 mL via ORAL
  Filled 2015-09-29: qty 15

## 2015-09-29 MED ORDER — ACETAMINOPHEN 325 MG PO TABS: 650.0000 mg | ORAL_TABLET | ORAL | Status: DC | PRN

## 2015-09-29 MED ORDER — FENTANYL 2.5 MCG/ML BUPIVACAINE 1/10 % EPIDURAL INFUSION (WH - ANES)
14.0000 mL/h | INTRAMUSCULAR | Status: DC | PRN
  Administered 2015-09-29 (×2): 14 mL/h via EPIDURAL
  Filled 2015-09-29 (×2): qty 125

## 2015-09-29 MED ORDER — LACTATED RINGERS IV SOLN: 500.0000 mL | INTRAVENOUS | Status: DC | PRN

## 2015-09-29 MED ORDER — PHENYLEPHRINE 40 MCG/ML (10ML) SYRINGE FOR IV PUSH (FOR BLOOD PRESSURE SUPPORT)
80.0000 ug | PREFILLED_SYRINGE | INTRAVENOUS | Status: DC | PRN
  Filled 2015-09-29: qty 5

## 2015-09-29 MED ORDER — LIDOCAINE HCL (PF) 1 % IJ SOLN
INTRAMUSCULAR | Status: DC | PRN
  Administered 2015-09-29 (×2): 6 mL

## 2015-09-29 MED ORDER — ONDANSETRON HCL 4 MG/2ML IJ SOLN
4.0000 mg | Freq: Four times a day (QID) | INTRAMUSCULAR | Status: DC | PRN
  Administered 2015-09-29: 4 mg via INTRAVENOUS
  Filled 2015-09-29: qty 2

## 2015-09-29 MED ORDER — LACTATED RINGERS IV SOLN
INTRAVENOUS | Status: DC
  Administered 2015-09-29 (×2): via INTRAVENOUS

## 2015-09-29 MED ORDER — PENICILLIN G POTASSIUM 5000000 UNITS IJ SOLR
5.0000 10*6.[IU] | Freq: Four times a day (QID) | INTRAVENOUS | Status: DC
  Administered 2015-09-29 (×2): 5 10*6.[IU] via INTRAVENOUS
  Filled 2015-09-29 (×5): qty 5

## 2015-09-29 NOTE — Progress Notes (Signed)
Patient ID: Jeanne LohJamie R Reed, female   DOB: 1990-09-09, 25 y.o.   MRN: [id number] Jeanne Reed is a 25 y.o. G1P0 at 8828w2d.  Subjective: Coping well w/ UCs with epidural in place  Objective: BP 119/57 mmHg  Pulse 80  Temp(Src) 98.8 F (37.1 C) (Oral)  Resp 20  Ht 5\' 6"  (1.676 m)  Wt 181 lb (82.101 kg)  BMI 29.23 kg/m2  SpO2 100%  LMP 11/21/2014   FHT:  FHR: 130 bpm, variability: mod,  accelerations:  15x15,  decelerations:  none UC:   Q irreg, minutes, mod  Dilation: 8 Effacement (%): 100 Cervical Position: Middle Station: 0 Presentation: Vertex Exam by:: erin davis rn  Labs: Results for orders placed or performed during the hospital encounter of 09/29/15 (from the past 24 hour(s))  CBC     Status: Abnormal   Collection Time: 09/29/15  9:30 AM  Result Value Ref Range   WBC 21.5 (H) 4.0 - 10.5 K/uL   RBC 4.51 3.87 - 5.11 MIL/uL   Hemoglobin 11.0 (L) 12.0 - 15.0 g/dL   HCT 40.934.2 (L) 81.136.0 - 91.446.0 %   MCV 75.8 (L) 78.0 - 100.0 fL   MCH 24.4 (L) 26.0 - 34.0 pg   MCHC 32.2 30.0 - 36.0 g/dL   RDW 78.215.4 95.611.5 - 21.315.5 %   Platelets 366 150 - 400 K/uL  Type and screen [facility] [place]     Status: None   Collection Time: 09/29/15  9:30 AM  Result Value Ref Range   ABO/RH(D) O POS    Antibody Screen NEG    Sample Expiration 10/02/2015   ABO/Rh     Status: None   Collection Time: 09/29/15  9:30 AM  Result Value Ref Range   ABO/RH(D) O POS     Assessment / Plan: 8528w2d week IUP Labor: Active. Expectant managment Fetal Wellbeing:  Category I Pain Control:  S/p epidural Anticipated MOD:  SVD   Federico FlakeKimberly Niles Newton, MD 09/29/2015 11:03 PM

## 2015-09-29 NOTE — Progress Notes (Signed)
Patient ID: Jeanne Reed, female   DOB: 02-23-1991, 25 y.o.   MRN: [id number] Jeanne LohJamie R Jorge is a 25 y.o. G1P0 at 4565w2d.  Subjective: Coping well w/ UCs  Objective: BP 121/70 mmHg  Pulse 80  Temp(Src) 98.2 F (36.8 C) (Oral)  Resp 17  Ht 5\' 6"  (1.676 m)  Wt 181 lb (82.101 kg)  BMI 29.23 kg/m2  LMP 11/21/2014   FHT:  FHR: 130 bpm, variability: mod,  accelerations:  15x15,  decelerations:  none UC:   Q irreg, minutes, mod  Dilation: 6 Effacement (%): 90 Cervical Position: Middle Station: 0 Presentation: Vertex Exam by:: Jinx Gilden  Labs: Results for orders placed or performed during the hospital encounter of 09/29/15 (from the past 24 hour(s))  CBC     Status: Abnormal   Collection Time: 09/29/15  9:30 AM  Result Value Ref Range   WBC 21.5 (H) 4.0 - 10.5 K/uL   RBC 4.51 3.87 - 5.11 MIL/uL   Hemoglobin 11.0 (L) 12.0 - 15.0 g/dL   HCT 40.934.2 (L) 81.136.0 - 91.446.0 %   MCV 75.8 (L) 78.0 - 100.0 fL   MCH 24.4 (L) 26.0 - 34.0 pg   MCHC 32.2 30.0 - 36.0 g/dL   RDW 78.215.4 95.611.5 - 21.315.5 %   Platelets 366 150 - 400 K/uL  Type and screen [facility] [place]     Status: None   Collection Time: 09/29/15  9:30 AM  Result Value Ref Range   ABO/RH(D) O POS    Antibody Screen NEG    Sample Expiration 10/02/2015   ABO/Rh     Status: None   Collection Time: 09/29/15  9:30 AM  Result Value Ref Range   ABO/RH(D) O POS     Assessment / Plan: 7765w2d week IUP Labor: Early Fetal Wellbeing:  Category I Pain Control:  None Anticipated MOD:  SVD May ambulate. Intermittent monitoring.   BrumleyVirginia Tranisha Tissue, [place] 09/29/2015 2:44 PM

## 2015-09-29 NOTE — H&P (Signed)
OBSTETRIC ADMISSION HISTORY AND PHYSICAL  Jeanne Reed is a 25 y.o. female G1P0 with IUP at [redacted]w[redacted]d by 7w [place] who presents with SOL. Pt started having contractions yesterday around 1500, but they have continued and worsened in severity around 0500 this morning. She reports loss of mucus plug around 0600. Confirms some N/V today, but no changed from baseline throughout pregnancy. She reports +FMs and denies any LOF, VB, blurry vision, or headaches. Mentions mild leg swelling, worse in the past 2 weeks. She plans on breast feeding. She request POPs for birth control.  Dating: By 7w [place] --->  Estimated Date of Delivery: 09/27/15  Sono:    , CWD, normal anatomy, 328g  Prenatal History/Complications: N/V, marijuana use, UTI in 3rd trimester (Enterococcus)  Past Medical History: Past Medical History  Diagnosis Date  . GERD (gastroesophageal reflux disease)   . Nausea and vomiting during pregnancy 02/08/2015    Past Surgical History: Past Surgical History  Procedure Laterality Date  . Tonsillectomy    . Wisdom tooth extraction      Obstetrical History: OB History    Gravida Para Term Preterm AB TAB SAB Ectopic Multiple Living   1               Social History: Social History   Social History  . Marital Status: Single    Spouse Name: N/A  . Number of Children: N/A  . Years of Education: N/A   Social History Main Topics  . Smoking status: Never Smoker   . Smokeless tobacco: Never Used  . Alcohol Use: No  . Drug Use: Yes    Special: Marijuana     Comment: 3-4 times daily, uses daily  . Sexual Activity: Not Currently    Birth Control/ Protection: None   Other Topics Concern  . None   Social History Narrative    Family History: Family History  Problem Relation Age of Onset  . Cancer Maternal Grandfather   . Diabetes Maternal Grandfather   . Cancer Paternal Grandmother   . Emphysema Paternal Grandfather     Allergies: No Known Allergies  Prescriptions prior to  admission  Medication Sig Dispense Refill Last Dose  . DICLEGIS 10-10 MG TBEC TAKE TWO TABLETS BY MOUTH AT BEDTIME FOR 2 DAYS, THEN ONE TAB IN THE MORNING, ONE TAB IN THE EVENING AND TWO TABS AT BEDTIME` 60 tablet 1 09/28/2015 at Unknown time  . esomeprazole (NEXIUM) 20 MG capsule Take 1 capsule (20 mg total) by mouth 2 (two) times daily. 60 capsule 1 09/28/2015 at Unknown time  . Pediatric Multiple Vit-C-FA (FLINSTONES GUMMIES OMEGA-3 DHA PO) Take by mouth daily.    09/28/2015 at Unknown time  . penicillin v potassium (VEETID) 500 MG tablet Take 1 tablet (500 mg total) by mouth 4 (four) times daily. X 7 days 28 tablet 0 09/28/2015 at Unknown time  . ranitidine (ZANTAC) 150 MG tablet Take 150 mg by mouth 2 (two) times daily.   09/28/2015 at Unknown time  . albuterol (PROVENTIL HFA;VENTOLIN HFA) 108 (90 Base) MCG/ACT inhaler Inhale 2 puffs into the lungs every 6 (six) hours as needed for wheezing or shortness of breath. 1 Inhaler 2 rescue     Review of Systems   GENERAL:  No fever, chills, weakness HEENT:  No vision changes, congestion, or throat pain. SKIN:  No rash or bruising CARDIOVASCULAR:  No chest pain, palpitations. Mild leg swelling. RESPIRATORY:  No shortness of breath, no cough GASTROINTESTINAL:  No nausea, vomiting or  diarrhea. No abdominal pain. GENITOURINARY:  IUP Pregnancy. Last menstrual period, 11/21/14, no vaginal bleeding NEUROLOGICAL:  No headache, dizziness, or numbness MUSCULOSKELETAL:  No back pain. ENDOCRINOLOGIC:  No reports of sweating, cold or heat intolerance.  Blood pressure 109/63, pulse 77, temperature 98.2 F (36.8 C), temperature source Oral, resp. rate 17, height 5\' 6"  (1.676 m), weight 82.101 kg (181 lb), last menstrual period 11/21/2014. General appearance: alert, cooperative, appears stated age and no distress Lungs: clear to auscultation bilaterally Heart: regular rate and rhythm, no murmurs Abdomen: soft, non-tender; bowel sounds normal Pelvic: See  below Extremities: Homans sign is negative, 1+ edema, no ecchymosis Presentation: cephalic Fetal monitoring: 135/mod/+accels/no decels Dilation: 4.5 Effacement (%): 100 Station: -1 Exam by:: Laurell Josephsheryl Anderson RN    Prenatal labs: ABO, Rh: --/--/O POS, O POS (06/16 0930) Antibody: NEG (06/16 0930) Rubella: Immune RPR: Non Reactive (03/15 0922)  HBsAg: Negative (10/26 1558)  HIV: Non Reactive (03/15 0922)  GBS: Negative (05/24 1630)  3 hr Glucola: 76/107/75 Genetic screening: neg Anatomy US: WNL  Prenatal Transfer Tool  Maternal Diabetes: No Genetic Screening: Normal Maternal Ultrasounds/Referrals: Normal Fetal Ultrasounds or other Referrals:  None Maternal Substance Abuse:  Yes:  Type: Marijuana Significant Maternal Medications:  None Significant Maternal Lab Results: Lab values include: Group B Strep negative  Results for orders placed or performed during the hospital encounter of 09/29/15 (from the past 24 hour(s))  CBC   Collection Time: 09/29/15  9:30 AM  Result Value Ref Range   WBC 21.5 (H) 4.0 - 10.5 K/uL   RBC 4.51 3.87 - 5.11 MIL/uL   Hemoglobin 11.0 (L) 12.0 - 15.0 g/dL   HCT 04.534.2 (L) 40.936.0 - 81.146.0 %   MCV 75.8 (L) 78.0 - 100.0 fL   MCH 24.4 (L) 26.0 - 34.0 pg   MCHC 32.2 30.0 - 36.0 g/dL   RDW 91.415.4 78.211.5 - 95.615.5 %   Platelets 366 150 - 400 K/uL  Type and screen [facility]    Collection Time: 09/29/15  9:30 AM  Result Value Ref Range   ABO/RH(D) O POS    Antibody Screen NEG    Sample Expiration 10/02/2015   ABO/Rh   Collection Time: 09/29/15  9:30 AM  Result Value Ref Range   ABO/RH(D) O POS     Patient Active Problem List   Diagnosis Date Noted  . Active labor at term 09/29/2015  . UTI (urinary tract infection) in pregnancy in third trimester 09/26/2015  . Marijuana use 08/08/2015  . Supervision of normal first pregnancy 02/08/2015  . Nausea and vomiting during pregnancy 02/08/2015  . 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 G1P0 at 6023w2d here for SOL in stable condition.  #Labor: Progressing normally. Will continue to monitor. #Pain: IV Fentanyl, eventual epidural #FWB: category 1 strip #ID: GBS neg #MOF: Breast feeding #MOC: POPs #Circ: N/A, baby girl #Marijuana: SW consult #Enterococcus UTI, 3rd trimester: Penicillin  Barnie Mortolleen McGuire, Med Student  09/29/2015, 11:41 AM  I performed the exam and agree with above.  [place] Cherika Jessie, [place] 09/29/2015 2:49 PM

## 2015-09-29 NOTE — MAU Note (Signed)
Patient continues to have contractions, have gotten worse since last night., lost mucus plug

## 2015-09-29 NOTE — Anesthesia Preprocedure Evaluation (Signed)
Anesthesia Evaluation  Patient identified by MRN, date of birth, ID band Patient awake    Reviewed: Allergy & Precautions, NPO status , Patient's Chart, lab work & pertinent test results  Airway Mallampati: II  TM Distance: >3 FB Neck ROM: Full    Dental no notable dental hx.    Pulmonary neg pulmonary ROS,    Pulmonary exam normal breath sounds clear to auscultation       Cardiovascular negative cardio ROS Normal cardiovascular exam Rhythm:Regular Rate:Normal     Neuro/Psych negative neurological ROS  negative psych ROS   GI/Hepatic Neg liver ROS, GERD  ,  Endo/Other  negative endocrine ROS  Renal/GU negative Renal ROS  negative genitourinary   Musculoskeletal negative musculoskeletal ROS (+)   Abdominal   Peds negative pediatric ROS (+)  Hematology negative hematology ROS (+)   Anesthesia Other Findings   Reproductive/Obstetrics negative OB ROS                             Anesthesia Physical Anesthesia Plan  ASA: II  Anesthesia Plan: Epidural   Post-op Pain Management:    Induction: Intravenous  Airway Management Planned: Natural Airway  Additional Equipment:   Intra-op Plan:   Post-operative Plan:   Informed Consent: I have reviewed the patients History and Physical, chart, labs and discussed the procedure including the risks, benefits and alternatives for the proposed anesthesia with the patient or authorized representative who has indicated his/her understanding and acceptance.   Dental advisory given  Plan Discussed with: CRNA  Anesthesia Plan Comments: (Informed consent obtained prior to proceeding including risk of failure, 1% risk of PDPH, risk of minor discomfort and bruising.  Discussed rare but serious complications including epidural abscess, permanent nerve injury, epidural hematoma.  Discussed alternatives to epidural analgesia and patient desires to  proceed.  Timeout performed pre-procedure verifying patient name, procedure, and platelet count.  Patient tolerated procedure well. )        Anesthesia Quick Evaluation

## 2015-09-29 NOTE — Anesthesia Procedure Notes (Signed)
Epidural Patient location during procedure: OB  Staffing Anesthesiologist: Linlee Cromie  Preanesthetic Checklist Completed: patient identified, site marked, surgical consent, pre-op evaluation, timeout performed, IV checked, risks and benefits discussed and monitors and equipment checked  Epidural Patient position: sitting Prep: DuraPrep Patient monitoring: heart rate and blood pressure Approach: midline Location: L3-L4 Injection technique: LOR saline  Needle:  Needle type: Tuohy  Needle gauge: 17 G Needle length: 9 cm Needle insertion depth: 5 cm Catheter type: closed end flexible Catheter size: 19 Gauge Catheter at skin depth: 11 cm Test dose: negative and Other  Assessment Events: blood not aspirated, injection not painful, no injection resistance, negative IV test and no paresthesia  Additional Notes Reason for block:procedure for pain   

## 2015-09-29 NOTE — MAU Note (Signed)
Notified MD patient was seen in MAU last evening was 1.5cm, now 3/90/-1, contractions every 3 to 4 minutes, per MD recheck in 1 hour.

## 2015-09-29 NOTE — Anesthesia Pain Management Evaluation Note (Signed)
  CRNA Pain Management Visit Note  Patient: Jeanne Reed, 25 y.o., female  "Hello [facility] We have an anesthesia team available at all times to provide care throughout the hospital, including epidural management and anesthesia for C-section. I don't know your plan for the delivery whether it a natural birth, water birth, IV sedation, nitrous supplementation, doula or epidural, but we want to meet your pain goals."   1.Was your pain managed to your expectations on prior hospitalizations?   No prior hospitalizations  2.What is your expectation for pain management during this hospitalization?     Epidural  3.How can we help you reach that goal? epidural  Record the patient's initial score and the patient's pain goal.   Pain: 7  Pain Goal: 8 [facility] wants you to be able to say your pain was always managed very well.  Cephus ShellingBURGER,Mckinna Demars 09/29/2015

## 2015-09-30 ENCOUNTER — Encounter (HOSPITAL_COMMUNITY): Payer: Self-pay | Admitting: *Deleted

## 2015-09-30 DIAGNOSIS — N390 Urinary tract infection, site not specified: Secondary | ICD-10-CM

## 2015-09-30 DIAGNOSIS — B952 Enterococcus as the cause of diseases classified elsewhere: Secondary | ICD-10-CM

## 2015-09-30 DIAGNOSIS — Z3A40 40 weeks gestation of pregnancy: Secondary | ICD-10-CM

## 2015-09-30 LAB — RPR: RPR Ser Ql: NONREACTIVE

## 2015-09-30 MED ORDER — OXYCODONE-ACETAMINOPHEN 5-325 MG PO TABS: 1.0000 | ORAL_TABLET | ORAL | Status: DC | PRN

## 2015-09-30 MED ORDER — ACETAMINOPHEN 325 MG PO TABS: 650.0000 mg | ORAL_TABLET | ORAL | Status: DC | PRN

## 2015-09-30 MED ORDER — COCONUT OIL OIL: 1.0000 "application " | TOPICAL_OIL | Status: DC | PRN

## 2015-09-30 MED ORDER — SIMETHICONE 80 MG PO CHEW: 80.0000 mg | CHEWABLE_TABLET | ORAL | Status: DC | PRN

## 2015-09-30 MED ORDER — ONDANSETRON HCL 4 MG PO TABS: 4.0000 mg | ORAL_TABLET | ORAL | Status: DC | PRN

## 2015-09-30 MED ORDER — ONDANSETRON HCL 4 MG/2ML IJ SOLN: 4.0000 mg | INTRAMUSCULAR | Status: DC | PRN

## 2015-09-30 MED ORDER — OXYCODONE-ACETAMINOPHEN 5-325 MG PO TABS: 2.0000 | ORAL_TABLET | ORAL | Status: DC | PRN

## 2015-09-30 MED ORDER — BENZOCAINE-MENTHOL 20-0.5 % EX AERO: 1.0000 "application " | INHALATION_SPRAY | CUTANEOUS | Status: DC | PRN

## 2015-09-30 MED ORDER — PRENATAL MULTIVITAMIN CH
1.0000 | ORAL_TABLET | Freq: Every day | ORAL | Status: DC
  Administered 2015-09-30 – 2015-10-01 (×2): 1 via ORAL
  Filled 2015-09-30 (×2): qty 1

## 2015-09-30 MED ORDER — WITCH HAZEL-GLYCERIN EX PADS: 1.0000 "application " | MEDICATED_PAD | CUTANEOUS | Status: DC | PRN

## 2015-09-30 MED ORDER — DIBUCAINE 1 % RE OINT: 1.0000 "application " | TOPICAL_OINTMENT | RECTAL | Status: DC | PRN

## 2015-09-30 MED ORDER — DIPHENHYDRAMINE HCL 25 MG PO CAPS: 25.0000 mg | ORAL_CAPSULE | Freq: Four times a day (QID) | ORAL | Status: DC | PRN

## 2015-09-30 MED ORDER — IBUPROFEN 600 MG PO TABS
600.0000 mg | ORAL_TABLET | Freq: Four times a day (QID) | ORAL | Status: DC
  Administered 2015-09-30 – 2015-10-01 (×5): 600 mg via ORAL
  Filled 2015-09-30 (×5): qty 1

## 2015-09-30 MED ORDER — DIPHENHYDRAMINE HCL 50 MG/ML IJ SOLN
50.0000 mg | Freq: Once | INTRAMUSCULAR | Status: AC
  Administered 2015-09-30: 50 mg via INTRAVENOUS

## 2015-09-30 MED ORDER — DOCUSATE SODIUM 100 MG PO CAPS
100.0000 mg | ORAL_CAPSULE | Freq: Two times a day (BID) | ORAL | Status: DC
  Administered 2015-09-30 – 2015-10-01 (×2): 100 mg via ORAL
  Filled 2015-09-30 (×2): qty 1

## 2015-09-30 NOTE — Lactation Note (Signed)
This note was copied from a baby's chart. Lactation Consultation Note  Patient Name: Jeanne Jordan LikesJamie Naill ZOXWR'UToday's Date: 09/30/2015 Reason for consult: Initial assessment Mom had baby [facility]. Baby demonstrating a good rhythmic suck with swallowing motions noted. Encouraged to BF with feeding ques, cluster feeding discussed. Marijuana use with BF discussed. Encouraged not to use marijuana while BF, hand out given regarding risks. Lactation brochure left for review, advised of OP services and support group. Mom denied other questions/concerns. Encouraged to call for assist as needed.   Maternal Data Has patient been taught Hand Expression?: Yes Does the patient have breastfeeding experience prior to this delivery?: No  Feeding Feeding Type: Breast Fed Length of feed: 60 min  LATCH Score/Interventions Latch: Grasps breast easily, tongue down, lips flanged, rhythmical sucking.  Audible Swallowing: Spontaneous and intermittent Intervention(s): Skin to skin  Type of Nipple: Everted at rest and after stimulation  Comfort (Breast/Nipple): Soft / non-tender     Hold (Positioning): No assistance needed to correctly position infant at breast. Intervention(s): Breastfeeding basics reviewed;Support Pillows;Position options;Skin to skin  LATCH Score: 10  Lactation Tools Discussed/Used WIC Program: Yes   Consult Status Consult Status: Follow-up Date: 10/01/15 Follow-up type: In-patient    Alfred LevinsGranger, Tamana Hatfield Ann 09/30/2015, 10:21 PM

## 2015-09-30 NOTE — Anesthesia Postprocedure Evaluation (Addendum)
Anesthesia Post Note  Patient: Jeanne Reed  Procedure(s) Performed: * No procedures listed *  Patient location during evaluation: Mother Baby Anesthesia Type: Epidural Level of consciousness: awake and alert, oriented and patient cooperative Pain management: pain level controlled Vital Signs Assessment: post-procedure vital signs reviewed and stable Respiratory status: spontaneous breathing Cardiovascular status: stable Postop Assessment: no headache, epidural receding, patient able to bend at knees and no signs of nausea or vomiting Anesthetic complications: no     Last Vitals:  Filed Vitals:   09/30/15 0601 09/30/15 0635  BP: 119/78 114/56  Pulse: 81 63  Temp:  37.1 C  Resp: 18 18    Last Pain:  Filed Vitals:   09/30/15 1342  PainSc: 0-No pain   Pain Goal: Patients Stated Pain Goal: 9 (09/29/15 1016)               Merrilyn PumaWRINKLE,Desi Rowe

## 2015-10-01 MED ORDER — NORETHINDRONE 0.35 MG PO TABS: 1.0000 | ORAL_TABLET | Freq: Every day | ORAL | Status: DC

## 2015-10-01 MED ORDER — IBUPROFEN 600 MG PO TABS: 600.0000 mg | ORAL_TABLET | Freq: Four times a day (QID) | ORAL | Status: DC

## 2015-10-01 MED ORDER — DOCUSATE SODIUM 100 MG PO CAPS: 100.0000 mg | ORAL_CAPSULE | Freq: Two times a day (BID) | ORAL | Status: DC

## 2015-10-01 NOTE — Clinical Social Work Maternal (Signed)
[place] CPS report made with Taylor Eggleston due to baby's positive THC UDS.  Shmuel Girgis, LCSW Clinical Social Worker [facility] [phone]     

## 2015-10-01 NOTE — Discharge Summary (Signed)
OB Discharge Summary  Patient Name: Jeanne Reed DOB: 11/02/1990 MRN: [id number]  Date of admission: 09/29/2015 Delivering MD: Lyndel Safe NILES   Date of discharge: 10/01/2015  Admitting diagnosis: 40WKS,LABOR Intrauterine pregnancy: [redacted]w[redacted]d     Secondary diagnosis:Principal Problem:   NSVD (normal spontaneous vaginal delivery) Active Problems:   Supervision of normal first pregnancy   Marijuana use   UTI (urinary tract infection) in pregnancy in third trimester  Additional problems:none     Discharge diagnosis: Term Pregnancy Delivered                                                                     Post partum procedures:none  Augmentation: none  Complications: None  Hospital course:  Onset of Labor With Vaginal Delivery     25 y.o. yo G1P1001 at [redacted]w[redacted]d was admitted in Active Labor on 09/29/2015. Patient had an uncomplicated labor course as follows:  Membrane Rupture Time/Date: 4:36 PM ,09/29/2015   Intrapartum Procedures: Episiotomy: None [1]                                         Lacerations:  None [1]  Patient had a delivery of a Viable infant. 09/30/2015  Information for the patient's newborn:  Mitsuko, Luera Girl Eri [[id number]]  Delivery Method: Vag-Spont    Pateint had an uncomplicated postpartum course.  She is ambulating, tolerating a regular diet, passing flatus, and urinating well. Patient is discharged home in stable condition on 10/01/2015.    Physical exam  Filed Vitals:   09/30/15 0635 09/30/15 1735 09/30/15 2100 10/01/15 0600  BP: 114/56 127/71 111/64 104/58  Pulse: 63 87 79 83  Temp: 98.8 F (37.1 C) 97.9 F (36.6 C) 98.3 F (36.8 C) 97.8 F (36.6 C)  TempSrc: Oral Oral Oral Oral  Resp: Height:      Weight:      SpO2:   98% 100%   General: alert, cooperative and no distress Lochia: appropriate Uterine Fundus: firm Incision: N/A DVT Evaluation: No evidence of DVT seen on physical exam. Negative Homan's sign. No cords  or calf tenderness. Labs: Lab Results  Component Value Date   WBC 21.5* 09/29/2015   HGB 11.0* 09/29/2015   HCT 34.2* 09/29/2015   MCV 75.8* 09/29/2015   PLT 366 09/29/2015   CMP Latest Ref Rng 02/09/2015  Glucose 65 - 99 mg/dL 95  BUN 6 - 20 mg/dL 7  Creatinine 7.82 - 9.56 mg/dL 2.13  Sodium 086 - 578 mmol/L 137  Potassium 3.5 - 5.1 mmol/L 3.6  Chloride 101 - 111 mmol/L 105  CO2 22 - 32 mmol/L 23  Calcium 8.9 - 10.3 mg/dL 9.3  Total Protein 6.5 - 8.1 g/dL -  Total Bilirubin 0.3 - 1.2 mg/dL -  Alkaline Phos 38 - 469 U/L -  AST 15 - 41 U/L -  ALT 14 - 54 U/L -    Discharge instruction: per After Visit Summary and "Baby and Me Booklet".  After Visit Meds:    Medication List    ASK your doctor about these medications  albuterol 108 (90 Base) MCG/ACT inhaler  Commonly known as:  PROVENTIL HFA;VENTOLIN HFA  Inhale 2 puffs into the lungs every 6 (six) hours as needed for wheezing or shortness of breath.     DICLEGIS 10-10 MG Tbec  Generic drug:  Doxylamine-Pyridoxine  TAKE TWO TABLETS BY MOUTH AT BEDTIME FOR 2 DAYS, THEN ONE TAB IN THE MORNING, ONE TAB IN THE EVENING AND TWO TABS AT BEDTIME`     esomeprazole 20 MG capsule  Commonly known as:  NEXIUM  Take 1 capsule (20 mg total) by mouth 2 (two) times daily.     FLINSTONES GUMMIES OMEGA-3 DHA PO  Take by mouth daily.     penicillin v potassium 500 MG tablet  Commonly known as:  VEETID  Take 1 tablet (500 mg total) by mouth 4 (four) times daily. X 7 days     ranitidine 150 MG tablet  Commonly known as:  ZANTAC  Take 150 mg by mouth 2 (two) times daily.        Diet: routine diet  Activity: Advance as tolerated. Pelvic rest for 6 weeks.   Outpatient follow up:6 weeks Follow up Appt:Future Appointments Date Time Provider Department Center  10/04/2015 1:30 PM Jacklyn ShellFrances Cresenzo-Dishmon, CNM [facility]   Follow up visit: No Follow-up on file.  Postpartum contraception: Progesterone only  pills  Newborn Data: Live born female  Birth Weight: 7 lb 6 oz (3345 g) APGAR: 8, 9  Baby Feeding: Breast Disposition:home with mother   10/01/2015 Wyvonnia DuskyMarie Lawson, CNM

## 2015-10-02 ENCOUNTER — Ambulatory Visit: Payer: Self-pay

## 2015-10-02 NOTE — Lactation Note (Signed)
This note was copied from a baby's chart. Lactation Consultation Note Follow up visit at 66 hours of age.  Mom reports just finishing a feeding with both photo therapy lights on and feeding was 30 minutes with what mom reports as continuous sucking.  LC encouraged mom to post pump after feedings and offer EBM to baby in addition to latching due to jaundice levels.  Mom plans to work on pumping more and also advised mom to work on hand expression after pumping to collect more EBM to give to baby. Mom reports FOB will assist during the night as well.  [facility] and request LC as needed.    Patient Name: Jeanne Reed NWGNF'AToday's Date: 10/02/2015     Maternal Data    Feeding Feeding Type: Breast Fed Length of feed: 30 min  LATCH Score/Interventions                      Lactation Tools Discussed/Used     Consult Status      Jeanne Reed, Jeanne Reed 10/02/2015, 11:02 PM

## 2015-10-02 NOTE — Lactation Note (Signed)
This note was copied from a baby's chart. Lactation Consultation Note  Patient Name: Jeanne Reed HYQMV'H Date: 10/02/2015 Reason for consult: Hyperbilirubinemia;Infant weight loss;Other (Comment) (8% weight loss) Baby 57 hours,  And is on double photo due to AO incompatibility. ( mom and dad aware )  Baby has been breast feeding consistently , w/ latch scores 7-  Voids and stools well and QS for age.  Baby due to feed while LC in the room. LC changed a wet and small transitioning stool.  LC assisted mom with positioning with the boppy pillow in football position, abby latched right on , needed some assist for depth, multiply swallows noted , increased with breast compressions.  Baby still feeding at 14 mins active pattern, per mom comfortable. Breast feeding with one bili light on .  Per mom mentioned the Pedis said it was ok to feed with one bili - light.  MBU RN Lady Deutscher aware to set up the DEBP , kit, basin, soap , towels in the room to set up.  Mom and dad aware of the reasons why the extra pumping is indicated - 8% weight loss, and jaundice.  Raquel Sarna also aware to show the mom finger feeding with EBM Yield. Mom receptive to pumping.   Maternal Data Has patient been taught Hand Expression?: Yes  Feeding Feeding Type: Breast Fed Length of feed: 15 min  LATCH Score/Interventions Latch: Grasps breast easily, tongue down, lips flanged, rhythmical sucking.  Audible Swallowing: Spontaneous and intermittent  Type of Nipple: Everted at rest and after stimulation  Comfort (Breast/Nipple): Filling, red/small blisters or bruises, mild/mod discomfort  Problem noted: Filling  Intervention(s): Breastfeeding basics reviewed;Support Pillows;Position options;Skin to skin     Lactation Tools Discussed/Used     Consult Status Consult Status: Follow-up Date: 10/03/15 Follow-up type: In-patient    Myer Haff 10/02/2015, 2:19 PM

## 2015-10-03 ENCOUNTER — Ambulatory Visit: Payer: Self-pay

## 2015-10-03 NOTE — Lactation Note (Signed)
This note was copied from a baby's chart. Lactation Consultation Note; Mom has baby latched to breast when I went into room. Reports she has been feeding well for 20 min and is getting sleepy. Reports no pain with latch. Has Ameda pump for home. Reports she pumped 2 times yesterday but none today since she has been nursing well. Phototherapy has been DC'd this morning. No questions at present. Reviewed our phone number to call with questions/concerns Patient Name: Jeanne Reed ZOXWR'UToday's Date: 10/03/2015 Reason for consult: Follow-up assessment   Maternal Data Formula Feeding for Exclusion: No Has patient been taught Hand Expression?: Yes Does the patient have breastfeeding experience prior to this delivery?: No  Feeding Feeding Type: Breast Fed Length of feed: 20 min  LATCH Score/Interventions Latch: Grasps breast easily, tongue down, lips flanged, rhythmical sucking.  Audible Swallowing: None (end of feeding and baby is sleepy)  Type of Nipple: Everted at rest and after stimulation  Comfort (Breast/Nipple): Soft / non-tender     Hold (Positioning): No assistance needed to correctly position infant at breast. Intervention(s): Breastfeeding basics reviewed  LATCH Score: 8  Lactation Tools Discussed/Used     Consult Status Consult Status: Complete    Pamelia HoitWeeks, Anyelo Mccue D 10/03/2015, 9:57 AM

## 2015-10-04 ENCOUNTER — Other Ambulatory Visit: Payer: Medicaid Other | Admitting: Advanced Practice Midwife

## 2015-10-05 ENCOUNTER — Inpatient Hospital Stay (HOSPITAL_COMMUNITY): Admission: RE | Admit: 2015-10-05 | Payer: Medicaid Other | Source: Ambulatory Visit

## 2015-10-28 ENCOUNTER — Telehealth (HOSPITAL_COMMUNITY): Payer: Self-pay | Admitting: Lactation Services

## 2015-10-28 NOTE — Telephone Encounter (Signed)
Mother called with pain in her left breast with purple nipple, areola sore.  Mother did feel shooting pain up into breast and white patches on infant's tongue.  Suggest she call Ped and MD for possible treatment.  Also discussed symptoms of Raynaud's and Mastitis.

## 2015-11-06 ENCOUNTER — Ambulatory Visit: Payer: Medicaid Other | Admitting: Women's Health

## 2015-11-08 ENCOUNTER — Ambulatory Visit (INDEPENDENT_AMBULATORY_CARE_PROVIDER_SITE_OTHER): Payer: Medicaid Other | Admitting: Women's Health

## 2015-11-08 ENCOUNTER — Encounter: Payer: Self-pay | Admitting: Women's Health

## 2015-11-08 DIAGNOSIS — S29012A Strain of muscle and tendon of back wall of thorax, initial encounter: Secondary | ICD-10-CM

## 2015-11-08 DIAGNOSIS — K649 Unspecified hemorrhoids: Secondary | ICD-10-CM

## 2015-11-08 MED ORDER — PRENATAL VITAMIN 27-0.8 MG PO TABS: 1.0000 | ORAL_TABLET | Freq: Every day | ORAL | 11 refills | Status: DC

## 2015-11-08 MED ORDER — CYCLOBENZAPRINE HCL 10 MG PO TABS: 10.0000 mg | ORAL_TABLET | Freq: Three times a day (TID) | ORAL | 0 refills | Status: DC | PRN

## 2015-11-08 NOTE — Patient Instructions (Signed)
Tips To Increase Milk Supply  Lots of water! Enough so that your urine is clear  Plenty of calories, if you're not getting enough calories, your milk supply can decrease  Breastfeed/pump often, every 2-3 hours x 20-25mins  Fenugreek 3 pills 3 times a day, this may make your urine smell like maple syrup  Mother's Milk Tea  Lactation cookies, google for the recipe  Real oatmeal    Preparation H for hemorrhoid  Norethindrone tablets (contraception) What is this medicine? NORETHINDRONE (nor eth IN drone) is an oral contraceptive. The product contains a female hormone known as a progestin. It is used to prevent pregnancy. This medicine may be used for other purposes; ask your health care provider or pharmacist if you have questions. What should I tell my health care provider before I take this medicine? They need to know if you have any of these conditions: -blood vessel disease or blood clots -breast, cervical, or vaginal cancer -diabetes -heart disease -kidney disease -liver disease -mental depression -migraine -seizures -stroke -vaginal bleeding -an unusual or allergic reaction to norethindrone, other medicines, foods, dyes, or preservatives -pregnant or trying to get pregnant -breast-feeding How should I use this medicine? Take this medicine by mouth with a glass of water. You may take it with or without food. Follow the directions on the prescription label. Take this medicine at the same time each day and in the order directed on the package. Do not take your medicine more often than directed. Contact your pediatrician regarding the use of this medicine in children. Special care may be needed. This medicine has been used in female children who have started having menstrual periods. A patient package insert for the product will be given with each prescription and refill. Read this sheet carefully each time. The sheet may change frequently. Overdosage: If you think you have  taken too much of this medicine contact a poison control center or emergency room at once. NOTE: This medicine is only for you. Do not share this medicine with others. What if I miss a dose? Try not to miss a dose. Every time you miss a dose or take a dose late your chance of pregnancy increases. When 1 pill is missed (even if only 3 hours late), take the missed pill as soon as possible and continue taking a pill each day at the regular time (use a back up method of birth control for the next 48 hours). If more than 1 dose is missed, use an additional birth control method for the rest of your pill pack until menses occurs. Contact your health care professional if more than 1 dose has been missed. What may interact with this medicine? Do not take this medicine with any of the following medications: -amprenavir or fosamprenavir -bosentan This medicine may also interact with the following medications: -antibiotics or medicines for infections, especially rifampin, rifabutin, rifapentine, and griseofulvin, and possibly penicillins or tetracyclines -aprepitant -barbiturate medicines, such as phenobarbital -carbamazepine -felbamate -modafinil -oxcarbazepine -phenytoin -ritonavir or other medicines for HIV infection or AIDS -St. John's wort -topiramate This list may not describe all possible interactions. Give your health care provider a list of all the medicines, herbs, non-prescription drugs, or dietary supplements you use. Also tell them if you smoke, drink alcohol, or use illegal drugs. Some items may interact with your medicine. What should I watch for while using this medicine? Visit your doctor or health care professional for regular checks on your progress. You will need a regular breast and  pelvic exam and Pap smear while on this medicine. Use an additional method of birth control during the first cycle that you take these tablets. If you have any reason to think you are pregnant, stop  taking this medicine right away and contact your doctor or health care professional. If you are taking this medicine for hormone related problems, it may take several cycles of use to see improvement in your condition. This medicine does not protect you against HIV infection (AIDS) or any other sexually transmitted diseases. What side effects may I notice from receiving this medicine? Side effects that you should report to your doctor or health care professional as soon as possible: -breast tenderness or discharge -pain in the abdomen, chest, groin or leg -severe headache -skin rash, itching, or hives -sudden shortness of breath -unusually weak or tired -vision or speech problems -yellowing of skin or eyes Side effects that usually do not require medical attention (report to your doctor or health care professional if they continue or are bothersome): -changes in sexual desire -change in menstrual flow -facial hair growth -fluid retention and swelling -headache -irritability -nausea -weight gain or loss This list may not describe all possible side effects. Call your doctor for medical advice about side effects. You may report side effects to FDA at 1-800-FDA-1088. Where should I keep my medicine? Keep out of the reach of children. Store at room temperature between 15 and 30 degrees C (59 and 86 degrees F). Throw away any unused medicine after the expiration date. NOTE: This sheet is a summary. It may not cover all possible information. If you have questions about this medicine, talk to your doctor, pharmacist, or health care provider.    2016, Elsevier/Gold Standard. (2011-12-20 16:41:35)

## 2015-11-08 NOTE — Progress Notes (Signed)
Subjective:    Jeanne Reed is a 25 y.o. G33P1001 Caucasian female who presents for a postpartum visit. She is 5 weeks postpartum following a spontaneous vaginal delivery at 40.3 gestational weeks. Anesthesia: epidural. I have fully reviewed the prenatal and intrapartum course. Postpartum course has been complicated by back injury- was lifting car seat out of stroller on Sat and felt pop in upper back and feeling of fluid running through back- still in a lot of pain- not getting any better. Baby's course has been uncomplicated. Baby is feeding by breast-Lt produces more than Rt.  Bleeding no bleeding. Bowel function is normal, does think she has a hemorrhoid- bleeding w/ bm, denies constipation/straining. Bladder function is normal. Patient is not sexually active. Last sexual activity: prior to birth of baby. Contraception method is has rx for POPs, hasn't started yet. Postpartum depression screening: negative. Score 5.  Last pap 02/08/15 and was neg.  The following portions of the patient's history were reviewed and updated as appropriate: allergies, current medications, past medical history, past surgical history and problem list.  Review of Systems Pertinent items are noted in HPI.   Vitals:   11/08/15 1502  BP: 122/64  Pulse: 64  Weight: 153 lb (69.4 kg)   No LMP recorded.  Objective:   General:  alert, cooperative and no distress   Breasts:  deferred, no complaints  Lungs: clear to auscultation bilaterally  Heart:  regular rate and rhythm  Abdomen: soft, nontender   Vulva: normal  Vagina: normal vagina  Cervix:  closed  Corpus: Well-involuted  Adnexa:  Non-palpable  Rectal Exam: No external hemorrhoid, possible internal       Back exam by LHE: possibly pulled muscles- recommended flexeril- if not better in 2wks- call for appt w/ him Assessment:   Postpartum exam 5 wks s/p SVB Breastfeeding Hemorrhoids Pulled muscle in back Depression screening Contraception counseling    Plan:  Rx flexeril for pulled muscle OTC hemorrhoid cream Call if back not improving w/ flexeril for appt w/ LHE for possible injection Contraception: begin POPs now, understands has to take at exact same time daily to be effective- set alarm to remind Follow up in: Oct for physical or earlier if needed  Marge Duncans CNM, [facility] 11/08/2015 3:41 PM

## 2016-02-20 ENCOUNTER — Ambulatory Visit (INDEPENDENT_AMBULATORY_CARE_PROVIDER_SITE_OTHER): Payer: Medicaid Other | Admitting: Advanced Practice Midwife

## 2016-02-20 ENCOUNTER — Encounter: Payer: Self-pay | Admitting: Advanced Practice Midwife

## 2016-02-20 VITALS — BP 112/78 | HR 80 | Ht 66.0 in | Wt 152.0 lb

## 2016-02-20 DIAGNOSIS — Z0000 Encounter for general adult medical examination without abnormal findings: Secondary | ICD-10-CM | POA: Diagnosis not present

## 2016-02-20 DIAGNOSIS — Z01419 Encounter for gynecological examination (general) (routine) without abnormal findings: Secondary | ICD-10-CM | POA: Diagnosis not present

## 2016-02-20 MED ORDER — METAXALONE 800 MG PO TABS: 800.0000 mg | ORAL_TABLET | Freq: Three times a day (TID) | ORAL | 1 refills | Status: DC | PRN

## 2016-02-20 MED ORDER — OMEPRAZOLE 20 MG PO CPDR: 20.0000 mg | DELAYED_RELEASE_CAPSULE | Freq: Every day | ORAL | 2 refills | Status: DC

## 2016-02-20 NOTE — Progress Notes (Signed)
Jeanne Reed 24 y.o.  Vitals:   02/20/16 1612  BP: 112/78  Pulse: 80     Filed Weights   02/20/16 1612  Weight: 152 lb (68.9 kg)    Past Medical History: Past Medical History:  Diagnosis Date  . GERD (gastroesophageal reflux disease)   . Nausea and vomiting during pregnancy 02/08/2015    Past Surgical History: Past Surgical History:  Procedure Laterality Date  . TONSILLECTOMY    . WISDOM TOOTH EXTRACTION      Family History: Family History  Problem Relation Age of Onset  . Cancer Maternal Grandfather   . Diabetes Maternal Grandfather   . Cancer Paternal Grandmother   . Emphysema Paternal Grandfather     Social History: Social History  Substance Use Topics  . Smoking status: Never Smoker  . Smokeless tobacco: Never Used  . Alcohol use No    Allergies: No Known Allergies    Current Outpatient Prescriptions:  .  esomeprazole (NEXIUM) 20 MG capsule, Take 1 capsule (20 mg total) by mouth 2 (two) times daily., Disp: 60 capsule, Rfl: 1 .  Fenugreek 610 MG CAPS, Take by mouth 3 (three) times daily., Disp: , Rfl:  .  ibuprofen (ADVIL,MOTRIN) 600 MG tablet, Take 1 tablet (600 mg total) by mouth every 6 (six) hours., Disp: 30 tablet, Rfl: 0 .  norethindrone (CAMILA) 0.35 MG tablet, Take 1 tablet (0.35 mg total) by mouth daily., Disp: 1 Package, Rfl: 11 .  Prenatal Vit-Fe Fumarate-FA (PRENATAL VITAMIN) 27-0.8 MG TABS, Take 1 tablet by mouth daily., Disp: 30 tablet, Rfl: 11 .  ranitidine (ZANTAC) 150 MG tablet, Take 150 mg by mouth 2 (two) times daily., Disp: , Rfl:  .  albuterol (PROVENTIL HFA;VENTOLIN HFA) 108 (90 Base) MCG/ACT inhaler, Inhale 2 puffs into the lungs every 6 (six) hours as needed for wheezing or shortness of breath. (Patient not taking: Reported on 02/20/2016), Disp: 1 Inhaler, Rfl: 2 .  cyclobenzaprine (FLEXERIL) 10 MG tablet, Take 1 tablet (10 mg total) by mouth every 8 (eight) hours as needed for muscle spasms. (Patient not taking: Reported on  02/20/2016), Disp: 30 tablet, Rfl: 0 .  docusate sodium (COLACE) 100 MG capsule, Take 1 capsule (100 mg total) by mouth 2 (two) times daily. (Patient not taking: Reported on 02/20/2016), Disp: 10 capsule, Rfl: 0 .  metaxalone (SKELAXIN) 800 MG tablet, Take 1 tablet (800 mg total) by mouth 3 (three) times daily as needed for muscle spasms., Disp: 60 tablet, Rfl: 1 .  omeprazole (PRILOSEC) 20 MG capsule, Take 1 capsule (20 mg total) by mouth daily., Disp: 90 capsule, Rfl: 2 .  Pediatric Multiple Vit-C-FA (FLINSTONES GUMMIES OMEGA-3 DHA PO), Take by mouth daily. , Disp: , Rfl:  .  penicillin v potassium (VEETID) 500 MG tablet, Take 1 tablet (500 mg total) by mouth 4 (four) times daily. X 7 days (Patient not taking: Reported on 02/20/2016), Disp: 28 tablet, Rfl: 0  History of Present Illness: here for gyn exam.  Gets clogged milk ducts, some nipple burning  Has scoliosis, can'ttake flexeril anymore d/t fatigue.  Doing well on micronoir     Review of Systems   Patient denies any headaches, blurred vision, shortness of breath, chest pain, abdominal pain, problems with bowel movements, urination, or intercourse.   Physical Exam: General:  Well developed, well nourished, no acute distress Skin:  Warm and dry Neck:  Midline trachea, normal thyroid Lungs; Clear to auscultation bilaterally Breast: nipples look normal, no erythema.  No clogged ducts  now Cardiovascular: Regular rate and rhythm Abdomen:  Soft, non tender, no hepatosplenomegaly Pelvic:  External genitalia is normal in appearance.  The vagina is normal in appearance.  The cervix is bulbous.  Uterus is felt to be normal size, shape, and contour.  No adnexal masses or tenderness noted.  Extremities:  No swelling or varicosities noted Psych:  No mood changes.     Impression: normal GYN exam     Plan: rx skelaxin; may see chiropractor or ortho prn [facility] Pap 10/19

## 2016-04-30 ENCOUNTER — Encounter: Payer: Self-pay | Admitting: Family Medicine

## 2016-04-30 ENCOUNTER — Ambulatory Visit (INDEPENDENT_AMBULATORY_CARE_PROVIDER_SITE_OTHER): Payer: Medicaid Other | Admitting: Family Medicine

## 2016-04-30 VITALS — BP 108/82 | Temp 98.4°F | Wt 155.0 lb

## 2016-04-30 DIAGNOSIS — B349 Viral infection, unspecified: Secondary | ICD-10-CM

## 2016-04-30 DIAGNOSIS — M4120 Other idiopathic scoliosis, site unspecified: Secondary | ICD-10-CM

## 2016-04-30 DIAGNOSIS — R070 Pain in throat: Secondary | ICD-10-CM | POA: Diagnosis not present

## 2016-04-30 LAB — POCT RAPID STREP A (OFFICE): RAPID STREP A SCREEN: NEGATIVE

## 2016-04-30 NOTE — Progress Notes (Signed)
   Subjective:    Patient ID: Jeanne Reed, female    DOB: 12-11-1990, 26 y.o.   MRN: [id number]  HPI Patient presents to office today for flu like symptoms.  Patient c/o nausea, chills, tickle in throat, and insomnia.  She states all symptoms started yesterday am. Review of Systems Patient relates a lot of sore throat no fevers or chills low-grade energy low appetite not feeling well denies sweats chills    Objective:   Physical Exam Throat minimal erythema neck is supple lungs clear heart regular  Patient has scoliosis complaining of a lot of back pain     Assessment & Plan:  Viral syndrome Sore throat Strep test negative Supportive measures discuss  Scoliosis referral to orthopedics specialist per patient request Follow-up if progressive symptoms

## 2016-05-03 LAB — CULTURE, GROUP A STREP: STREP A CULTURE: NEGATIVE

## 2016-05-06 ENCOUNTER — Encounter: Payer: Self-pay | Admitting: Family Medicine

## 2016-06-03 ENCOUNTER — Ambulatory Visit (INDEPENDENT_AMBULATORY_CARE_PROVIDER_SITE_OTHER): Payer: Medicaid Other | Admitting: Obstetrics & Gynecology

## 2016-06-03 ENCOUNTER — Encounter: Payer: Self-pay | Admitting: Obstetrics & Gynecology

## 2016-06-03 ENCOUNTER — Telehealth: Payer: Self-pay | Admitting: *Deleted

## 2016-06-03 VITALS — BP 100/70 | HR 80 | Temp 98.6°F | Wt 160.4 lb

## 2016-06-03 DIAGNOSIS — N61 Mastitis without abscess: Secondary | ICD-10-CM | POA: Diagnosis not present

## 2016-06-03 MED ORDER — DICLOXACILLIN SODIUM 500 MG PO CAPS: 500.0000 mg | ORAL_CAPSULE | Freq: Four times a day (QID) | ORAL | 0 refills | Status: DC

## 2016-06-03 MED ORDER — TERCONAZOLE 0.4 % VA CREA: 1.0000 | TOPICAL_CREAM | Freq: Every day | VAGINAL | 0 refills | Status: DC

## 2016-06-03 NOTE — Telephone Encounter (Signed)
Informed patient that she needs to be seen. States she can come at 4pm today.

## 2016-06-03 NOTE — Telephone Encounter (Signed)
Patient called stating she is pretty certain she has mastitis. She has had a clogged duct since last night along with decreased milk supply, body aches, fever and a reddened area. Her fever has broken since taking Tylenol. She has tried warm compresses and  feeding in different positions. Would you like patient to schedule appointment. Please advise.

## 2016-06-03 NOTE — Progress Notes (Signed)
Chief Complaint  Patient presents with  . work-in gyn visit    Mastitis LT Breast    Blood pressure 100/70, pulse 80, temperature 98.6 F (37 C), weight 160 lb 6.4 oz (72.8 kg), currently breastfeeding.  26 y.o. G1P1001 No LMP recorded. The current method of family planning is breast feeding.  Outpatient Encounter Prescriptions as of 06/03/2016  Medication Sig  . esomeprazole (NEXIUM) 20 MG capsule Take 1 capsule (20 mg total) by mouth 2 (two) times daily.  Marland Kitchen. ibuprofen (ADVIL,MOTRIN) 600 MG tablet Take 1 tablet (600 mg total) by mouth every 6 (six) hours.  . norethindrone (CAMILA) 0.35 MG tablet Take 1 tablet (0.35 mg total) by mouth daily.  . ranitidine (ZANTAC) 150 MG tablet Take 150 mg by mouth 2 (two) times daily.  . dicloxacillin (DYNAPEN) 500 MG capsule Take 1 capsule (500 mg total) by mouth 4 (four) times daily.  . Prenatal Vit-Fe Fumarate-FA (PRENATAL VITAMIN) 27-0.8 MG TABS Take 1 tablet by mouth daily.  Marland Kitchen. terconazole (TERAZOL 7) 0.4 % vaginal cream Place 1 applicator vaginally at bedtime.  . [DISCONTINUED] albuterol (PROVENTIL HFA;VENTOLIN HFA) 108 (90 Base) MCG/ACT inhaler Inhale 2 puffs into the lungs every 6 (six) hours as needed for wheezing or shortness of breath. (Patient not taking: Reported on 06/03/2016)  . [DISCONTINUED] cyclobenzaprine (FLEXERIL) 10 MG tablet Take 1 tablet (10 mg total) by mouth every 8 (eight) hours as needed for muscle spasms.  . [DISCONTINUED] docusate sodium (COLACE) 100 MG capsule Take 1 capsule (100 mg total) by mouth 2 (two) times daily. (Patient not taking: Reported on 04/30/2016)  . [DISCONTINUED] Fenugreek 610 MG CAPS Take by mouth 3 (three) times daily.  . [DISCONTINUED] metaxalone (SKELAXIN) 800 MG tablet Take 1 tablet (800 mg total) by mouth 3 (three) times daily as needed for muscle spasms. (Patient not taking: Reported on 04/30/2016)   No facility-administered encounter medications on file as of 06/03/2016.     Subjective Pt  presentsStating that she has had an area of redness tenderness of the left breast now for several days and all of her usual remedies have not been successful Additionally she ran fever last night 201  Objective A wedge of the left breast is red and tender and warm There is no breast abscess appreciated  Pertinent ROS No burning with urination, frequency or urgency No nausea, vomiting or diarrhea Nor fever chills or other constitutional symptoms   Labs or studies     Impression Diagnoses this Encounter::   ICD-9-CM ICD-10-CM   1. Mastitis, left, acute 611.0 N61.0     Established relevant diagnosis(es):   Plan/Recommendations: Meds ordered this encounter  Medications  . dicloxacillin (DYNAPEN) 500 MG capsule    Sig: Take 1 capsule (500 mg total) by mouth 4 (four) times daily.    Dispense:  40 capsule    Refill:  0  . terconazole (TERAZOL 7) 0.4 % vaginal cream    Sig: Place 1 applicator vaginally at bedtime.    Dispense:  45 g    Refill:  0    Labs or Scans Ordered: No orders of the defined types were placed in this encounter.   Management:: Dicloxacillin 4 times a day for 10 days If the patient does respond well she will come back again otherwise we'll see her back in 2 weeks for follow-up She understands this can develop into an abscess and should be taken seriously  Follow up Return in about 2 weeks (around 06/17/2016) for  Follow up, with Dr Despina Hidden.           All questions were answered.  Past Medical History:  Diagnosis Date  . GERD (gastroesophageal reflux disease)   . Nausea and vomiting during pregnancy 02/08/2015    Past Surgical History:  Procedure Laterality Date  . TONSILLECTOMY    . WISDOM TOOTH EXTRACTION      OB History    Gravida Para Term Preterm AB Living   1 1 1     1    SAB TAB Ectopic Multiple Live Births         0 1      No Known Allergies  Social History   Social History  . Marital status: Single    Spouse name:  N/A  . Number of children: N/A  . Years of education: N/A   Social History Main Topics  . Smoking status: Never Smoker  . Smokeless tobacco: Never Used  . Alcohol use No  . Drug use: No     Comment: 3-4 times daily, uses daily  . Sexual activity: Yes    Birth control/ protection: Pill   Other Topics Concern  . None   Social History Narrative  . None    Family History  Problem Relation Age of Onset  . Cancer Maternal Grandfather   . Diabetes Maternal Grandfather   . Cancer Paternal Grandmother   . Emphysema Paternal Grandfather

## 2016-06-17 ENCOUNTER — Ambulatory Visit: Payer: Medicaid Other | Admitting: Obstetrics & Gynecology

## 2016-07-05 DIAGNOSIS — G8929 Other chronic pain: Secondary | ICD-10-CM | POA: Diagnosis not present

## 2016-07-05 DIAGNOSIS — M545 Low back pain: Secondary | ICD-10-CM | POA: Diagnosis not present

## 2016-07-05 DIAGNOSIS — M41125 Adolescent idiopathic scoliosis, thoracolumbar region: Secondary | ICD-10-CM | POA: Diagnosis not present

## 2016-09-26 ENCOUNTER — Encounter: Payer: Self-pay | Admitting: Family Medicine

## 2016-09-26 ENCOUNTER — Ambulatory Visit (INDEPENDENT_AMBULATORY_CARE_PROVIDER_SITE_OTHER): Payer: Medicaid Other | Admitting: Family Medicine

## 2016-09-26 VITALS — BP 98/60 | Temp 98.8°F | Ht 66.0 in | Wt 163.0 lb

## 2016-09-26 DIAGNOSIS — B349 Viral infection, unspecified: Secondary | ICD-10-CM

## 2016-09-26 DIAGNOSIS — J029 Acute pharyngitis, unspecified: Secondary | ICD-10-CM | POA: Diagnosis not present

## 2016-09-26 LAB — POCT RAPID STREP A (OFFICE): RAPID STREP A SCREEN: NEGATIVE

## 2016-09-26 NOTE — Progress Notes (Signed)
   Subjective:    Patient ID: Jeanne Reed, female    DOB: 11-23-1990, 26 y.o.   MRN: [id number]  Sinusitis  This is a new problem. The current episode started yesterday. Associated symptoms include chills, headaches and a sore throat. (Diarrhea, body aches) Past treatments include acetaminophen (ibuprofen).   yest throa astarted hurting  Took tylenol   Nose all stoped up  Body ached and chills  Had temp 99.5   Slight diarrhea  Results for orders placed or performed in visit on 09/26/16  POCT rapid strep A  Result Value Ref Range   Rapid Strep A Screen Negative Negative     Pos psot heafach  Was at a b day party    Review of Systems  Constitutional: Positive for chills.  HENT: Positive for sore throat.   Neurological: Positive for headaches.       Objective:   Physical Exam  Alert active good hydration slight nasal congestion pharynx slight erythema neck supple lungs clear. Heart regular rate and rhythm.      Assessment & Plan:  Impression probable viral syndrome. Symptoms just since yesterday. Symptom care only. Warning signs discussed

## 2016-09-27 LAB — STREP A DNA PROBE: STREP GP A DIRECT, DNA PROBE: NEGATIVE

## 2016-09-30 ENCOUNTER — Encounter: Payer: Self-pay | Admitting: Family Medicine

## 2016-09-30 ENCOUNTER — Ambulatory Visit (INDEPENDENT_AMBULATORY_CARE_PROVIDER_SITE_OTHER): Payer: Medicaid Other | Admitting: Family Medicine

## 2016-09-30 ENCOUNTER — Ambulatory Visit: Payer: Medicaid Other | Admitting: Women's Health

## 2016-09-30 VITALS — BP 114/68 | Temp 98.8°F | Ht 66.0 in | Wt 164.0 lb

## 2016-09-30 DIAGNOSIS — J329 Chronic sinusitis, unspecified: Secondary | ICD-10-CM

## 2016-09-30 DIAGNOSIS — J310 Chronic rhinitis: Secondary | ICD-10-CM

## 2016-09-30 MED ORDER — AMOXICILLIN 500 MG PO CAPS: 500.0000 mg | ORAL_CAPSULE | Freq: Three times a day (TID) | ORAL | 0 refills | Status: DC

## 2016-09-30 NOTE — Progress Notes (Signed)
   Subjective:    Patient ID: Jeanne Reed, female    DOB: [birth year], 26 y.o.   MRN: [id number]  Sinusitis  This is a new problem. Episode onset: 5 days. Associated symptoms include congestion, coughing, ear pain, headaches and a sore throat. Treatments tried: tylenol, advil cold and sinus.    losso fof voice   Frontal heaface  Cough is geernerlly some productive  Energy od, appetite ok   tylenol ibuprofen  Worse at night   Review of Systems  HENT: Positive for congestion, ear pain and sore throat.   Respiratory: Positive for cough.   Neurological: Positive for headaches.       Objective:   Physical Exam  Alert, mild malaise. Hydration good Vitals stable. frontal/ maxillary tenderness evident positive nasal congestion. pharynx normal neck supple  lungs clear/no crackles or wheezes. heart regular in rhythm       Assessment & Plan:  Impression rhinosinusitis likely post viral, discussed with patient. plan antibiotics prescribed. Questions answered. Symptomatic care discussed. warning signs discussed. WSL

## 2016-10-14 ENCOUNTER — Other Ambulatory Visit: Payer: Self-pay | Admitting: Women's Health

## 2016-10-26 ENCOUNTER — Other Ambulatory Visit: Payer: Self-pay | Admitting: Obstetrics and Gynecology

## 2016-10-29 ENCOUNTER — Other Ambulatory Visit: Payer: Self-pay | Admitting: Advanced Practice Midwife

## 2016-10-29 ENCOUNTER — Telehealth: Payer: Self-pay | Admitting: *Deleted

## 2016-10-29 MED ORDER — NORETHINDRONE 0.35 MG PO TABS: 1.0000 | ORAL_TABLET | Freq: Every day | ORAL | 11 refills | Status: DC

## 2016-10-29 NOTE — Progress Notes (Unsigned)
[facility]

## 2016-11-11 ENCOUNTER — Other Ambulatory Visit: Payer: Self-pay | Admitting: Women's Health

## 2017-01-22 ENCOUNTER — Ambulatory Visit (INDEPENDENT_AMBULATORY_CARE_PROVIDER_SITE_OTHER): Payer: Medicaid Other | Admitting: Advanced Practice Midwife

## 2017-01-22 ENCOUNTER — Encounter: Payer: Self-pay | Admitting: Advanced Practice Midwife

## 2017-01-22 VITALS — BP 120/80 | HR 114 | Wt 168.3 lb

## 2017-01-22 DIAGNOSIS — Z113 Encounter for screening for infections with a predominantly sexual mode of transmission: Secondary | ICD-10-CM | POA: Diagnosis not present

## 2017-01-22 MED ORDER — BENZONATATE 100 MG PO CAPS: 100.0000 mg | ORAL_CAPSULE | Freq: Three times a day (TID) | ORAL | 0 refills | Status: DC | PRN

## 2017-01-22 NOTE — Progress Notes (Signed)
Marland Kitchen  [facility] Visit  Patient name: Jeanne Reed MRN [id number]  Date of birth: [birth year]  CC & HPI:  Jeanne Reed is a 26 y.o. Caucasian female presenting today for Cough X 10 days and STD screen. Has had a dry cough .  Already on 2 PPI for reflux.  Smokes MJ, not cigs.  Thinks boyfriend cheated, wants STD screen.   Pertinent History Reviewed:  Medical & Surgical Hx:   Past Medical History:  Diagnosis Date  . GERD (gastroesophageal reflux disease)   . Nausea and vomiting during pregnancy 02/08/2015   Past Surgical History:  Procedure Laterality Date  . TONSILLECTOMY    . WISDOM TOOTH EXTRACTION     Family History  Problem Relation Age of Onset  . Cancer Maternal Grandfather   . Diabetes Maternal Grandfather   . Cancer Paternal Grandmother   . Emphysema Paternal Grandfather     Current Outpatient Prescriptions:  .  esomeprazole (NEXIUM) 20 MG capsule, Take 1 capsule (20 mg total) by mouth 2 (two) times daily., Disp: 60 capsule, Rfl: 1 .  ranitidine (ZANTAC) 150 MG tablet, Take 150 mg by mouth 2 (two) times daily., Disp: , Rfl:  .  amoxicillin (AMOXIL) 500 MG capsule, Take 1 capsule (500 mg total) by mouth 3 (three) times daily. (Patient not taking: Reported on 01/22/2017), Disp: 30 capsule, Rfl: 0 .  benzonatate (TESSALON PERLES) 100 MG capsule, Take 1 capsule (100 mg total) by mouth 3 (three) times daily as needed for cough., Disp: 40 capsule, Rfl: 0 .  norethindrone (CAMILA) 0.35 MG tablet, Take 1 tablet (0.35 mg total) by mouth daily. (Patient not taking: Reported on 01/22/2017), Disp: 1 Package, Rfl: 11 .  norethindrone (MICRONOR,CAMILA,ERRIN) 0.35 MG tablet, Take 1 tablet (0.35 mg total) by mouth daily. (Patient not taking: Reported on 01/22/2017), Disp: 1 Package, Rfl: 11 .  Prenatal Vit-Fe Fumarate-FA (PNV PRENATAL PLUS MULTIVITAMIN) 27-1 MG TABS, TAKE 1 TABLET BY MOUTH ONCE DAILY **PATIENT  NEEDS  APPOINTMENT  FOR  PHYSICAL  PRIOR  TO  ANY  FURTHER  REFILLS**  (Patient not taking: Reported on 01/22/2017), Disp: 30 tablet, Rfl: 11 .  Prenatal Vit-Fe Fumarate-FA (PRENATAL VITAMIN) 27-0.8 MG TABS, Take 1 tablet by mouth daily. (Patient not taking: Reported on 01/22/2017), Disp: 30 tablet, Rfl: 11 Social History: Reviewed -  reports that she has never smoked. She has never used smokeless tobacco.  Review of Systems:   Constitutional: Negative for fever and chills Eyes: Negative for visual disturbances Respiratory: Negative for shortness of breath, dyspnea Cardiovascular: Negative for chest pain or palpitations  Gastrointestinal: Negative for vomiting, diarrhea and constipation; no abdominal pain Genitourinary: Negative for dysuria and urgency, vaginal irritation or itching Musculoskeletal: Negative for back pain, joint pain, myalgias  Neurological: Negative for dizziness and headaches    Objective Findings:    Physical Examination: General appearance - well appearing, and in no distress Mental status - alert, oriented to person, place, and time Chest:  Normal respiratory effortLungs CTAB Heart - normal rate and regular rhythm Abdomen:  Soft, nontender Pelvic:deferred Musculoskeletal:  Normal range of motion without pain Extremities:  No edema    No results found for this or any previous visit (from the past 24 hour(s)).    Assessment & Plan:  A:   STD check  Cough for 10 dayus P:  STD screen.  Rx tessalon perles; if cough persists, see PCP   No Follow-up on file.  CRESENZO-DISHMAN,Zorawar Strollo CNM 01/22/2017 3:18 PM

## 2017-01-23 LAB — HIV ANTIBODY (ROUTINE TESTING W REFLEX): HIV SCREEN 4TH GENERATION: NONREACTIVE

## 2017-01-23 LAB — RPR: RPR Ser Ql: NONREACTIVE

## 2017-01-24 LAB — TRICHOMONAS VAGINALIS, PROBE AMP: TRICH VAG BY NAA: NEGATIVE

## 2017-01-24 LAB — GC/CHLAMYDIA PROBE AMP
Chlamydia trachomatis, NAA: NEGATIVE
Neisseria gonorrhoeae by PCR: NEGATIVE

## 2017-01-27 ENCOUNTER — Ambulatory Visit (INDEPENDENT_AMBULATORY_CARE_PROVIDER_SITE_OTHER): Payer: Medicaid Other | Admitting: Nurse Practitioner

## 2017-01-27 ENCOUNTER — Encounter: Payer: Self-pay | Admitting: Nurse Practitioner

## 2017-01-27 VITALS — BP 108/70 | Temp 98.3°F | Ht 66.0 in | Wt 171.0 lb

## 2017-01-27 DIAGNOSIS — B9689 Other specified bacterial agents as the cause of diseases classified elsewhere: Secondary | ICD-10-CM | POA: Diagnosis not present

## 2017-01-27 DIAGNOSIS — J069 Acute upper respiratory infection, unspecified: Secondary | ICD-10-CM

## 2017-01-27 DIAGNOSIS — Z23 Encounter for immunization: Secondary | ICD-10-CM

## 2017-01-27 DIAGNOSIS — K219 Gastro-esophageal reflux disease without esophagitis: Secondary | ICD-10-CM | POA: Diagnosis not present

## 2017-01-27 MED ORDER — AZITHROMYCIN 250 MG PO TABS: ORAL_TABLET | ORAL | 0 refills | Status: DC

## 2017-01-27 NOTE — Patient Instructions (Signed)
Food Choices for Gastroesophageal Reflux Disease, Adult When you have gastroesophageal reflux disease (GERD), the foods you eat and your eating habits are very important. Choosing the right foods can help ease the discomfort of GERD. Consider working with a diet and nutrition specialist (dietitian) to help you make healthy food choices. What general guidelines should I follow? Eating plan  Choose healthy foods low in fat, such as fruits, vegetables, whole grains, low-fat dairy products, and lean meat, fish, and poultry.  Eat frequent, small meals instead of three large meals each day. Eat your meals slowly, in a relaxed setting. Avoid bending over or lying down until 2-3 hours after eating.  Limit high-fat foods such as fatty meats or fried foods.  Limit your intake of oils, butter, and shortening to less than 8 teaspoons each day.  Avoid the following: ? Foods that cause symptoms. These may be different for different people. Keep a food diary to keep track of foods that cause symptoms. ? Alcohol. ? Drinking large amounts of liquid with meals. ? Eating meals during the 2-3 hours before bed.  Cook foods using methods other than frying. This may include baking, grilling, or broiling. Lifestyle   Maintain a healthy weight. Ask your health care provider what weight is healthy for you. If you need to lose weight, work with your health care provider to do so safely.  Exercise for at least 30 minutes on 5 or more days each week, or as told by your health care provider.  Avoid wearing clothes that fit tightly around your waist and chest.  Do not use any products that contain nicotine or tobacco, such as cigarettes and e-cigarettes. If you need help quitting, ask your health care provider.  Sleep with the head of your bed raised. Use a wedge under the mattress or blocks under the bed frame to raise the head of the bed. What foods are not recommended? The items listed may not be a complete  list. Talk with your dietitian about what dietary choices are best for you. Grains Pastries or quick breads with added fat. French toast. Vegetables Deep fried vegetables. French fries. Any vegetables prepared with added fat. Any vegetables that cause symptoms. For some people this may include tomatoes and tomato products, chili peppers, onions and garlic, and horseradish. Fruits Any fruits prepared with added fat. Any fruits that cause symptoms. For some people this may include citrus fruits, such as oranges, grapefruit, pineapple, and lemons. Meats and other protein foods High-fat meats, such as fatty beef or pork, hot dogs, ribs, ham, sausage, salami and bacon. Fried meat or protein, including fried fish and fried chicken. Nuts and nut butters. Dairy Whole milk and chocolate milk. Sour cream. Cream. Ice cream. Cream cheese. Milk shakes. Beverages Coffee and tea, with or without caffeine. Carbonated beverages. Sodas. Energy drinks. Fruit juice made with acidic fruits (such as orange or grapefruit). Tomato juice. Alcoholic drinks. Fats and oils Butter. Margarine. Shortening. Ghee. Sweets and desserts Chocolate and cocoa. Donuts. Seasoning and other foods Pepper. Peppermint and spearmint. Any condiments, herbs, or seasonings that cause symptoms. For some people, this may include curry, hot sauce, or vinegar-based salad dressings. Summary  When you have gastroesophageal reflux disease (GERD), food and lifestyle choices are very important to help ease the discomfort of GERD.  Eat frequent, small meals instead of three large meals each day. Eat your meals slowly, in a relaxed setting. Avoid bending over or lying down until 2-3 hours after eating.  Limit high-fat   foods such as fatty meat or fried foods. This information is not intended to replace advice given to you by your health care provider. Make sure you discuss any questions you have with your health care provider. Document Released:  04/01/2005 Document Revised: 04/02/2016 Document Reviewed: 04/02/2016 Elsevier Interactive Patient Education  2017 Else[employer] Inc.  

## 2017-01-29 ENCOUNTER — Encounter: Payer: Self-pay | Admitting: Nurse Practitioner

## 2017-01-29 NOTE — Progress Notes (Signed)
Subjective:  Presents for complaints of cough and scratchy throat for the past 2 weeks. No fever. Some hoarseness. Facial area headache. Clear nasal drainage. Cough is worse at night and in the morning. No wheezing. No ear pain. No abdominal pain. No vomiting or diarrhea. Has been taking Zantac for reflux but has noticed a flareup lately. Does have a prescription for generic Nexium as well. Does not smoke tobacco but does smoke daily marijuana. Has increased her caffeine intake lately.  Objective:   BP 108/70   Temp 98.3 F (36.8 C) (Oral)   Ht 5\' 6"  (1.676 m)   Wt 171 lb (77.6 kg)   LMP 01/07/2017   BMI 27.60 kg/m  NAD. Alert, oriented. TMs clear effusion, no erythema. Pharynx mildly injected with PND noted. Neck supple with mild soft anterior adenopathy. Lungs clear. Heart regular rate rhythm. Abdomen soft nondistended with mild epigastric area tenderness. No rebound or guarding. No obvious masses.  Assessment:   Problem List Items Addressed This Visit      Digestive   Gastroesophageal reflux disease without esophagitis    Other Visit Diagnoses    Bacterial upper respiratory infection    -  Primary   Relevant Medications   azithromycin (ZITHROMAX Z-PAK) 250 MG tablet   Need for vaccination       Relevant Orders   Flu Vaccine QUAD 6+ mos PF IM (Fluarix Quad PF) (Completed)       Plan:   Meds ordered this encounter  Medications  . azithromycin (ZITHROMAX Z-PAK) 250 MG tablet    Sig: Take 2 tablets (500 mg) on  Day 1,  followed by 1 tablet (250 mg) once daily on Days 2 through 5.    Dispense:  6 each    Refill:  0    Order Specific Question:   Supervising Provider    Answer:   Merlyn AlbertLUKING, WILLIAM S [[id number]]   OTC meds as directed for congestion and cough. Restart Nexium as directed. Call back in 2 weeks if reflux and upper abdominal pain have not resolved, sooner if worse. Warning signs reviewed.

## 2017-02-04 ENCOUNTER — Telehealth: Payer: Self-pay | Admitting: Family Medicine

## 2017-02-04 MED ORDER — AMOXICILLIN-POT CLAVULANATE 875-125 MG PO TABS: 1.0000 | ORAL_TABLET | Freq: Two times a day (BID) | ORAL | 0 refills | Status: DC

## 2017-02-04 MED ORDER — BENZONATATE 100 MG PO CAPS: 100.0000 mg | ORAL_CAPSULE | Freq: Three times a day (TID) | ORAL | 1 refills | Status: DC | PRN

## 2017-02-04 NOTE — Telephone Encounter (Signed)
Prescriptions sent electronically to pharmacy. Patient notified. °

## 2017-02-04 NOTE — Telephone Encounter (Signed)
Augmentin 875 mg 1 twice a day for 10 days-let the patient know that this is a combination medicine that tends to be very strong. Take it with food to lessen the chance of upset stomach. As for the cough the patient has choices-either Tessalon when necessary which does not cause drowsiness or Hycodan for evening use only which can cause drowsiness and is not meant for daytime use-typically the cough with significant infection is there for a reason on until the underlying illness gets better it's difficult for the cough to get better-certainly follow-up if ongoing troubles after trying new medicine

## 2017-02-04 NOTE — Telephone Encounter (Signed)
Patient was seen 10/15 and given z pack- still has cough and would like a stronger antibiotic and cough med sent to pharmacy.

## 2017-02-04 NOTE — Telephone Encounter (Signed)
Patient was seen last week finished up antibiotics but cough is worst.She is requesting different antibiotic because was too strong she stated and cough medication called into [facility] 

## 2017-02-04 NOTE — Addendum Note (Signed)
Addended by: Margaretha SheffieldBROWN, Jaxon Flatt S on: 02/04/2017 03:51 PM   Modules accepted: Orders

## 2017-02-04 NOTE — Telephone Encounter (Signed)
Tessalon 100 mg 1 3 times a day when necessary cough #21 1 refill

## 2017-02-04 NOTE — Telephone Encounter (Signed)
Patient would like tessalon perles

## 2017-02-04 NOTE — Addendum Note (Signed)
Addended by: Margaretha SheffieldBROWN, AUTUMN S on: 02/04/2017 04:54 PM   Modules accepted: Orders

## 2017-02-14 ENCOUNTER — Ambulatory Visit (INDEPENDENT_AMBULATORY_CARE_PROVIDER_SITE_OTHER): Payer: Medicaid Other | Admitting: Nurse Practitioner

## 2017-02-14 VITALS — BP 110/76 | Temp 98.0°F | Wt 168.1 lb

## 2017-02-14 DIAGNOSIS — J209 Acute bronchitis, unspecified: Secondary | ICD-10-CM

## 2017-02-14 DIAGNOSIS — Z3202 Encounter for pregnancy test, result negative: Secondary | ICD-10-CM

## 2017-02-14 DIAGNOSIS — J069 Acute upper respiratory infection, unspecified: Secondary | ICD-10-CM | POA: Diagnosis not present

## 2017-02-14 DIAGNOSIS — N912 Amenorrhea, unspecified: Secondary | ICD-10-CM

## 2017-02-14 DIAGNOSIS — K602 Anal fissure, unspecified: Secondary | ICD-10-CM | POA: Diagnosis not present

## 2017-02-14 LAB — POCT URINE PREGNANCY: PREG TEST UR: NEGATIVE

## 2017-02-14 MED ORDER — HYDROCODONE-HOMATROPINE 5-1.5 MG/5ML PO SYRP: 5.0000 mL | ORAL_SOLUTION | ORAL | 0 refills | Status: DC | PRN

## 2017-02-14 MED ORDER — LEVOFLOXACIN 500 MG PO TABS: 500.0000 mg | ORAL_TABLET | Freq: Every day | ORAL | 0 refills | Status: DC

## 2017-02-14 MED ORDER — KETOCONAZOLE 2 % EX CREA: 1.0000 "application " | TOPICAL_CREAM | Freq: Two times a day (BID) | CUTANEOUS | 4 refills | Status: DC

## 2017-02-15 ENCOUNTER — Encounter: Payer: Self-pay | Admitting: Nurse Practitioner

## 2017-02-15 NOTE — Progress Notes (Signed)
Subjective: Presents for complaints of cough and congestion over the past 4 weeks.  Was last seen on 01/27/2017 for upper respiratory infection, previous notes.  States she never quite got over that illness.  Over the past 5 days has developed more chest congestion.  No documented fever but has had some sweats.  No sore throat.  Facial area headache.  Runny nose.  Cough producing a large amount of mucus, unsure about color.  No wheezing.  Some ear pain.  Rare posttussive vomiting.  Has had diarrhea a couple of times a day.  No bloody stools.  Completed Augmentin.  No abdominal pain.  Fluids well.  Voiding normal limit.  Is not currently on any birth control.  Last menstrual cycle was in September, did not have a cycle in October.  Last intercourse was 2 weeks ago.  Also has a sore area in the gluteal fold near the rectum.  Began a few days ago.  Objective:   BP 110/76   Temp 98 F (36.7 C) (Oral)   Wt 168 lb 2 oz (76.3 kg)   BMI 27.14 kg/m  NAD.  Alert, oriented.  TMs clear effusion, no erythema.  Pharynx injected with PND noted.  Neck supple with mild soft anterior adenopathy.  Lungs scattered faint expiratory crackles, no wheezing or tachypnea.  Frequent nonproductive cough.  Normal color.  Heart regular rate and rhythm.  Very tiny non-erythematous fissure is noted at the base of the introitus.  Minimal erythema noted.  No lesions.  Vagina minimal white mucoid discharge, no erythema.  Minimal erythema towards the rectal area but a small superficial non-erythematous fissure noted.  No evidence of abscess or cellulitis. Results for orders placed or performed in visit on 02/14/17  POCT urine pregnancy  Result Value Ref Range   Preg Test, Ur Negative Negative     Assessment:  Acute upper respiratory infection  Acute bronchitis, unspecified organism  Amenorrhea - Plan: POCT urine pregnancy  Fissure, anorectal    Plan:   Meds ordered this encounter  Medications  . ketoconazole (NIZORAL) 2 %  cream    Sig: Apply 1 application topically 2 (two) times daily.    Dispense:  30 g    Refill:  4    Order Specific Question:   Supervising Provider    Answer:   Merlyn AlbertLUKING, WILLIAM S [[id number]]  . levofloxacin (LEVAQUIN) 500 MG tablet    Sig: Take 1 tablet (500 mg total) by mouth daily.    Dispense:  10 tablet    Refill:  0    Order Specific Question:   Supervising Provider    Answer:   Merlyn AlbertLUKING, WILLIAM S [[id number]]  . HYDROcodone-homatropine (HYCODAN) 5-1.5 MG/5ML syrup    Sig: Take 5 mLs by mouth every 4 (four) hours as needed.    Dispense:  90 mL    Refill:  0    Order Specific Question:   Supervising Provider    Answer:   Merlyn AlbertLUKING, WILLIAM S [[id number]]   Ketoconazole topically externally to vaginal and rectal area.  Measures to help with rectal fissure.  Start Levaquin as directed.  Advised patient to avoid intercourse or use backup method while on Levaquin, not to use if pregnant.  Patient verbalizes understanding.  Hydrocodone cough syrup for nighttime use.  Drowsiness precautions.  OTC meds for daytime use.  Warning signs reviewed.  Call back early next week if no improvement, sooner if worse.  Otherwise expect gradual resolution of symptoms.

## 2017-02-17 ENCOUNTER — Other Ambulatory Visit: Payer: Self-pay | Admitting: Nurse Practitioner

## 2017-02-17 ENCOUNTER — Ambulatory Visit: Payer: Medicaid Other | Admitting: Nurse Practitioner

## 2017-02-17 VITALS — Temp 98.4°F | Ht 66.0 in | Wt 167.0 lb

## 2017-02-17 DIAGNOSIS — J069 Acute upper respiratory infection, unspecified: Secondary | ICD-10-CM

## 2017-02-17 DIAGNOSIS — N9089 Other specified noninflammatory disorders of vulva and perineum: Secondary | ICD-10-CM | POA: Diagnosis not present

## 2017-02-17 MED ORDER — VALACYCLOVIR HCL 1 G PO TABS: 1000.0000 mg | ORAL_TABLET | Freq: Two times a day (BID) | ORAL | 0 refills | Status: DC

## 2017-02-18 ENCOUNTER — Encounter: Payer: Self-pay | Admitting: Nurse Practitioner

## 2017-02-18 NOTE — Progress Notes (Signed)
Subjective: Presents for complaints of sores in her vaginal area first noticed last night.  Tender to palpation.  Has had some chills and achiness.  Continues her antibiotic for her bronchitis and upper respiratory infection, see previous note.  No documented fever.  Continues to have some slightly yellowish nasal mucus.  Vomiting of mucus x1, none since.  New sexual partner in October, had unprotected sex 3 times.    Objective:   Temp 98.4 F (36.9 C) (Oral)   Ht 5\' 6"  (1.676 m)   Wt 167 lb (75.8 kg)   BMI 26.95 kg/m  NAD.  Alert, oriented.  TMs clear effusion, no erythema.  Pharynx minimally injected with clear PND noted.  Neck supple with mild soft anterior adenopathy.  Lungs clear.  No wheezing or tachypnea.  Occasional nonproductive cough noted.  Heart regular rate rhythm.  External GU several tiny superficial ulcers noted on erythematous base along the lower labia/introitus.  Tender to palpation.  Culture obtained.  No vaginal discharge.  Labia otherwise clear.  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 valACYclovir (VALTREX) 1000 MG tablet    Sig: Take 1 tablet (1,000 mg total) 2 (two) times daily by mouth.    Dispense:  20 tablet    Refill:  0    Order Specific Question:   Supervising Provider    Answer:   Merlyn AlbertLUKING, WILLIAM S [[id number]]   Herpes culture pending.  Reviewed safe sex issues.  Start valacyclovir as directed.  Further follow-up based on culture results.  Complete Levaquin as directed.

## 2017-02-20 LAB — SPECIMEN STATUS REPORT

## 2017-02-20 LAB — HERPES SIMPLEX VIRUS CULTURE

## 2017-02-21 ENCOUNTER — Telehealth: Payer: Self-pay

## 2017-02-21 ENCOUNTER — Encounter: Payer: Self-pay | Admitting: Nurse Practitioner

## 2017-02-21 DIAGNOSIS — B009 Herpesviral infection, unspecified: Secondary | ICD-10-CM | POA: Insufficient documentation

## 2017-02-21 NOTE — Telephone Encounter (Signed)
Due to busy clinic, will talk with her on Monday

## 2017-02-24 ENCOUNTER — Encounter: Payer: Self-pay | Admitting: Nurse Practitioner

## 2017-02-24 ENCOUNTER — Ambulatory Visit: Payer: Medicaid Other | Admitting: Nurse Practitioner

## 2017-02-24 VITALS — BP 102/68 | Ht 66.0 in | Wt 165.0 lb

## 2017-02-24 DIAGNOSIS — B009 Herpesviral infection, unspecified: Secondary | ICD-10-CM | POA: Diagnosis not present

## 2017-02-24 MED ORDER — VALACYCLOVIR HCL 1 G PO TABS: ORAL_TABLET | ORAL | 2 refills | Status: DC

## 2017-02-25 ENCOUNTER — Encounter: Payer: Self-pay | Admitting: Nurse Practitioner

## 2017-02-25 LAB — SPECIMEN STATUS REPORT

## 2017-02-25 LAB — HERPES SIMPLEX VIRUS CULTURE

## 2017-02-25 NOTE — Progress Notes (Addendum)
Subjective: Presents to discuss her recent diagnoses of type II genital herpes.  Patient is unsure at this time where she picked up the viral infection.  Has not told her current partner.  States that he works out of town and that she has caught him looking at dating websites such as tumblr.  According to patient she had full STD testing including blood testing done in October all of which was negative.  Emotionally much calmer and dealing with her situation.  Objective:   BP 102/68   Ht 5\' 6"  (1.676 m)   Wt 165 lb (74.8 kg)   BMI 26.63 kg/m  NAD.  Alert, oriented.  Calm affect.  Assessment:  Herpes simplex type 2 infection    Plan: The entire office visit was spent discussing her diagnosis counseling regarding her treatment options and safe sex issues.  At this time patient elects to treat sporadically for any outbreaks, will reconsider suppressive therapy if recurrent outbreaks.  Patient understands the virus can be spread even when she is asymptomatic due to viral shedding.  Resources given in case she has further questions.  Recheck here as needed.    Meds ordered this encounter  Medications  . valACYclovir (VALTREX) 1000 MG tablet    Sig: Take one po qd x 5 d prn    Dispense:  5 tablet    Refill:  2    Order Specific Question:   Supervising Provider    Answer:   Riccardo DubinLUKING, WILLIAM S [[id number]]

## 2017-05-05 ENCOUNTER — Encounter: Payer: Self-pay | Admitting: Family Medicine

## 2017-05-05 ENCOUNTER — Ambulatory Visit: Payer: Medicaid Other | Admitting: Family Medicine

## 2017-05-05 VITALS — BP 110/72 | Temp 98.2°F | Ht 66.0 in | Wt 161.0 lb

## 2017-05-05 DIAGNOSIS — J329 Chronic sinusitis, unspecified: Secondary | ICD-10-CM

## 2017-05-05 DIAGNOSIS — J310 Chronic rhinitis: Secondary | ICD-10-CM

## 2017-05-05 DIAGNOSIS — I889 Nonspecific lymphadenitis, unspecified: Secondary | ICD-10-CM

## 2017-05-05 MED ORDER — CEFPROZIL 500 MG PO TABS: 500.0000 mg | ORAL_TABLET | Freq: Two times a day (BID) | ORAL | 0 refills | Status: DC

## 2017-05-05 MED ORDER — ONDANSETRON 4 MG PO TBDP: 4.0000 mg | ORAL_TABLET | Freq: Three times a day (TID) | ORAL | 0 refills | Status: DC | PRN

## 2017-05-05 NOTE — Progress Notes (Signed)
   Subjective:    Patient ID: Jeanne Reed, female    DOB: 01/17/1991, 27 y.o.   MRN: [id number]   Sinusitis   This is a new problem. Episode onset: 2 days.  Associated symptoms include congestion, coughing and headaches. ( Vomiting)   Gland swollen in the neck  Had a bit of runny nose   achey in uscles and oints     Review of Systems  HENT: Positive for congestion.   Respiratory: Positive for cough.   Neurological: Positive for headaches.       Objective:   Physical Exam   Alert, mild malaise. Hydration good Vitals stable. frontal/ maxillary tenderness evident positive nasal congestion. pharynx normal neck supple  lungs clear/no crackles or wheezes. heart regular in rhythm  Tender anterior left lymph node cervical    Assessment & Plan:  Impression rhinosinusitis likely post viral, discussed with patient. plan antibiotics prescribed. Questions answered. Symptomatic care discussed. warning signs discussed. WSL [id number] plus cervical lymphadenitis

## 2017-06-11 ENCOUNTER — Ambulatory Visit: Payer: Medicaid Other | Admitting: Nurse Practitioner

## 2017-06-11 ENCOUNTER — Encounter: Payer: Self-pay | Admitting: Nurse Practitioner

## 2017-06-11 VITALS — BP 126/88 | Ht 66.0 in | Wt 161.4 lb

## 2017-06-11 DIAGNOSIS — B009 Herpesviral infection, unspecified: Secondary | ICD-10-CM

## 2017-06-11 MED ORDER — VALACYCLOVIR HCL 1 G PO TABS: 1000.0000 mg | ORAL_TABLET | Freq: Every day | ORAL | 11 refills | Status: DC

## 2017-06-11 NOTE — Progress Notes (Signed)
Subjective:  Presents for recheck on genital herpes. Has had 3 breakouts since the fall. Tends to have them around the time of her cycle and with illness. Usually 2 doses of Valtrex will calm down the symptoms. Wants to try daily suppressive therapy. Same sexual partner who has been very supportive with her diagnosis.   Objective:   BP 126/88   Ht 5\' 6"  (1.676 m)   Wt 161 lb 6.4 oz (73.2 kg)   BMI 26.05 kg/m  NAD. Alert, oriented. Lungs clear. Heart RRR.   Assessment:   Problem List Items Addressed This Visit      Other   Herpes simplex type 2 infection - Primary   Relevant Medications   valACYclovir (VALTREX) 1000 MG tablet       Plan:   Meds ordered this encounter  Medications  . valACYclovir (VALTREX) 1000 MG tablet    Sig: Take 1 tablet (1,000 mg total) by mouth daily.    Dispense:  30 tablet    Refill:  11    Order Specific Question:   Supervising Provider    Answer:   Merlyn AlbertLUKING, WILLIAM S [[id number]]   Patient understands that this may not totally suppress herpetic breakouts but should greatly help.  Call back if no improvement.  Return if symptoms worsen or fail to improve.

## 2017-08-06 ENCOUNTER — Ambulatory Visit: Payer: Medicaid Other | Admitting: Women's Health

## 2017-09-01 ENCOUNTER — Ambulatory Visit: Payer: Medicaid Other | Admitting: Nurse Practitioner

## 2017-09-01 ENCOUNTER — Encounter: Payer: Self-pay | Admitting: Nurse Practitioner

## 2017-09-01 VITALS — BP 110/64 | Temp 98.5°F | Ht 66.0 in | Wt 158.0 lb

## 2017-09-01 DIAGNOSIS — R1011 Right upper quadrant pain: Secondary | ICD-10-CM

## 2017-09-01 DIAGNOSIS — B9689 Other specified bacterial agents as the cause of diseases classified elsewhere: Secondary | ICD-10-CM | POA: Diagnosis not present

## 2017-09-01 DIAGNOSIS — J209 Acute bronchitis, unspecified: Secondary | ICD-10-CM

## 2017-09-01 DIAGNOSIS — K219 Gastro-esophageal reflux disease without esophagitis: Secondary | ICD-10-CM

## 2017-09-01 DIAGNOSIS — F419 Anxiety disorder, unspecified: Secondary | ICD-10-CM | POA: Insufficient documentation

## 2017-09-01 DIAGNOSIS — J069 Acute upper respiratory infection, unspecified: Secondary | ICD-10-CM

## 2017-09-01 MED ORDER — NORETHIN-ETH ESTRAD-FE BIPHAS 1 MG-10 MCG / 10 MCG PO TABS: 1.0000 | ORAL_TABLET | Freq: Every day | ORAL | 11 refills | Status: DC

## 2017-09-01 MED ORDER — CLARITHROMYCIN 500 MG PO TABS: 500.0000 mg | ORAL_TABLET | Freq: Two times a day (BID) | ORAL | 0 refills | Status: DC

## 2017-09-01 MED ORDER — PANTOPRAZOLE SODIUM 40 MG PO TBEC: 40.0000 mg | DELAYED_RELEASE_TABLET | Freq: Two times a day (BID) | ORAL | 0 refills | Status: DC

## 2017-09-01 MED ORDER — ESCITALOPRAM OXALATE 10 MG PO TABS: 10.0000 mg | ORAL_TABLET | Freq: Every day | ORAL | 2 refills | Status: DC

## 2017-09-01 MED ORDER — BENZONATATE 100 MG PO CAPS: 100.0000 mg | ORAL_CAPSULE | Freq: Three times a day (TID) | ORAL | 0 refills | Status: DC | PRN

## 2017-09-01 NOTE — Progress Notes (Signed)
Subjective: Presents for complaints of nonproductive cough for the past 2 weeks.  No fever.  Scratchy throat.  No difficulty swallowing.  No sinus headache.  Had an episode of vomiting 2 days ago which mostly look like phlegm.  Has had dry heaves and throwing up today mostly undigested food.  Has a history of GERD, was started on PPI while she was pregnant, symptoms were well controlled at that time.  Tried to stop her medication but symptoms came back.  Began taking OTC Nexium twice daily.  For the past 2 to 3 months has had breakthrough symptoms.  Unassociated with any particular foods.  Epigastric area pain and tightness at times.  Worse if she does not eat.  Denies tobacco or alcohol use.  No NSAID use.  Has been under tremendous stress, recently broke up with her boyfriend of 8 years.  Has a toddler.  Has moved back in with her parents.  Also had a back injury a few months ago which she is still getting over.  Currently on her menstrual cycle.  Would like to restart her birth control pills. GAD 7 : Generalized Anxiety Score 09/01/2017  Nervous, Anxious, on Edge 3  Control/stop worrying 3  Worry too much - different things 3  Trouble relaxing 3  Restless 3  Easily annoyed or irritable 3  Afraid - awful might happen 3  Total GAD 7 Score 21  Anxiety Difficulty Somewhat difficult      Objective:   BP 110/64   Temp 98.5 F (36.9 C) (Oral)   Ht  (1.676 m)   Wt 158 lb (71.7 kg)   BMI 25.50 kg/m  NAD.  Alert, oriented.  TMs clear effusion, no erythema.  Pharynx injected with green PND noted.  Neck supple with mild soft anterior adenopathy.  Lungs scattered faint expiratory crackles, no wheezing or tachypnea.  Frequent nonproductive cough noted.  Heart regular rate rhythm.  Abdomen soft nondistended with distinct upper epigastric area tenderness extending into the right upper quadrant.  No rebound or guarding.  No obvious masses or organomegaly.  Assessment:   Problem List Items Addressed  This Visit      Digestive   Gastroesophageal reflux disease without esophagitis - Primary     Other   Anxiety   Relevant Medications   escitalopram (LEXAPRO) 10 MG tablet    Other Visit Diagnoses    Right upper quadrant abdominal pain       Relevant Orders   Lipase   Hepatic function panel   CBC with Differential   Basic Metabolic Panel (BMET)   US Abdomen Limited RUQ   Bacterial upper respiratory infection       Relevant Medications   clarithromycin (BIAXIN) 500 MG tablet   Acute bronchitis, unspecified organism           Plan:   Meds ordered this encounter  Medications  . escitalopram (LEXAPRO) 10 MG tablet    Sig: Take 1 tablet (10 mg total) by mouth daily.    Dispense:  30 tablet    Refill:  2    Order Specific Question:   Supervising Provider    Answer:   Merlyn Albert [[id number]]  . Norethindrone-Ethinyl Estradiol-Fe Biphas (LO LOESTRIN FE) 1 MG-10 MCG / 10 MCG tablet    Sig: Take 1 tablet by mouth daily.    Dispense:  1 Package    Refill:  11    Order Specific Question:   Supervising Provider    Answer:  Merlyn Albert [[id number]]  . clarithromycin (BIAXIN) 500 MG tablet    Sig: Take 1 tablet (500 mg total) by mouth 2 (two) times daily.    Dispense:  20 tablet    Refill:  0    Order Specific Question:   Supervising Provider    Answer:   Merlyn Albert [[id number]]  . benzonatate (TESSALON) 100 MG capsule    Sig: Take 1 capsule (100 mg total) by mouth 3 (three) times daily as needed for cough.    Dispense:  30 capsule    Refill:  0    Order Specific Question:   Supervising Provider    Answer:   Merlyn Albert [[id number]]  . pantoprazole (PROTONIX) 40 MG tablet    Sig: Take 1 tablet (40 mg total) by mouth 2 (two) times daily before a meal.    Dispense:  60 tablet    Refill:  0    Order Specific Question:   Supervising Provider    Answer:   Merlyn Albert [[id number]]   Stop Nexium. Switch to Protonix BID for now. Take Maalox or TUMS for breakthrough symptoms.   Start Biaxin as directed. OTC meds as directed for cough and congestion. Call back by end of the week if no improvement. Start Lexapro. Reviewed potential adverse effects. DC med and contact office if any problems.  Labs and [place] pending. Further follow up based on results. Go to ED sooner if worse.  Return in about 2 weeks (around 09/15/2017) for recheck. 25 minutes was spent with the patient.  This statement verifies that 25 minutes was indeed spent with the patient. Greater than half the time was spent in discussion, counseling and answering questions  regarding the issues that the patient came in for today as reflected in the diagnosis (s) please refer to documentation for further details.   Given information on diet for reflux. sw

## 2017-09-01 NOTE — Patient Instructions (Signed)
Food Choices for Gastroesophageal Reflux Disease, Adult When you have gastroesophageal reflux disease (GERD), the foods you eat and your eating habits are very important. Choosing the right foods can help ease your discomfort. What guidelines do I need to follow?  Choose fruits, vegetables, whole grains, and low-fat dairy products.  Choose low-fat meat, fish, and poultry.  Limit fats such as oils, salad dressings, butter, nuts, and avocado.  Keep a food diary. This helps you identify foods that cause symptoms.  Avoid foods that cause symptoms. These may be different for everyone.  Eat small meals often instead of 3 large meals a day.  Eat your meals slowly, in a place where you are relaxed.  Limit fried foods.  Cook foods using methods other than frying.  Avoid drinking alcohol.  Avoid drinking large amounts of liquids with your meals.  Avoid bending over or lying down until 2-3 hours after eating. What foods are not recommended? These are some foods and drinks that may make your symptoms worse: Vegetables  Tomatoes. Tomato juice. Tomato and spaghetti sauce. Chili peppers. Onion and garlic. Horseradish. Fruits  Oranges, grapefruit, and lemon (fruit and juice). Meats  High-fat meats, fish, and poultry. This includes hot dogs, ribs, ham, sausage, salami, and bacon. Dairy  Whole milk and chocolate milk. Sour cream. Cream. Butter. Ice cream. Cream cheese. Drinks  Coffee and tea. Bubbly (carbonated) drinks or energy drinks. Condiments  Hot sauce. Barbecue sauce. Sweets/Desserts  Chocolate and cocoa. Donuts. Peppermint and spearmint. Fats and Oils  High-fat foods. This includes French fries and potato chips. Other  Vinegar. Strong spices. This includes black pepper, white pepper, red pepper, cayenne, curry powder, cloves, ginger, and chili powder. The items listed above may not be a complete list of foods and drinks to avoid. Contact your dietitian for more information.    This information is not intended to replace advice given to you by your health care provider. Make sure you discuss any questions you have with your health care provider. Document Released: 10/01/2011 Document Revised: 09/07/2015 Document Reviewed: 02/03/2013 Elsevier Interactive Patient Education  2017 Elsevier Inc.  

## 2017-09-02 ENCOUNTER — Other Ambulatory Visit: Payer: Self-pay

## 2017-09-02 ENCOUNTER — Emergency Department (HOSPITAL_COMMUNITY)
Admission: EM | Admit: 2017-09-02 | Discharge: 2017-09-02 | Disposition: A | Discharge status: Home / Self Care | Payer: Medicaid Other | Attending: Emergency Medicine | Admitting: Emergency Medicine

## 2017-09-02 ENCOUNTER — Encounter (HOSPITAL_COMMUNITY): Payer: Self-pay | Admitting: Emergency Medicine

## 2017-09-02 DIAGNOSIS — R112 Nausea with vomiting, unspecified: Secondary | ICD-10-CM | POA: Insufficient documentation

## 2017-09-02 DIAGNOSIS — Z79899 Other long term (current) drug therapy: Secondary | ICD-10-CM | POA: Insufficient documentation

## 2017-09-02 DIAGNOSIS — R197 Diarrhea, unspecified: Secondary | ICD-10-CM | POA: Diagnosis not present

## 2017-09-02 LAB — URINALYSIS, ROUTINE W REFLEX MICROSCOPIC
BILIRUBIN URINE: NEGATIVE
GLUCOSE, UA: NEGATIVE mg/dL
KETONES UR: 5 mg/dL — AB
Nitrite: NEGATIVE
PH: 7 (ref 5.0–8.0)
PROTEIN: 100 mg/dL — AB
Specific Gravity, Urine: 1.029 (ref 1.005–1.030)

## 2017-09-02 LAB — CBC
HCT: 45 % (ref 36.0–46.0)
HEMOGLOBIN: 14.7 g/dL (ref 12.0–15.0)
MCH: 29.9 pg (ref 26.0–34.0)
MCHC: 32.7 g/dL (ref 30.0–36.0)
MCV: 91.5 fL (ref 78.0–100.0)
PLATELETS: 219 10*3/uL (ref 150–400)
RBC: 4.92 MIL/uL (ref 3.87–5.11)
RDW: 13.5 % (ref 11.5–15.5)
WBC: 5.7 10*3/uL (ref 4.0–10.5)

## 2017-09-02 LAB — COMPREHENSIVE METABOLIC PANEL
ALT: 34 U/L (ref 14–54)
ANION GAP: 9 (ref 5–15)
AST: 24 U/L (ref 15–41)
Albumin: 3.9 g/dL (ref 3.5–5.0)
Alkaline Phosphatase: 83 U/L (ref 38–126)
BUN: 10 mg/dL (ref 6–20)
CALCIUM: 8.6 mg/dL — AB (ref 8.9–10.3)
CHLORIDE: 105 mmol/L (ref 101–111)
CO2: 25 mmol/L (ref 22–32)
Creatinine, Ser: 0.89 mg/dL (ref 0.44–1.00)
Glucose, Bld: 104 mg/dL — ABNORMAL HIGH (ref 65–99)
Potassium: 3.6 mmol/L (ref 3.5–5.1)
SODIUM: 139 mmol/L (ref 135–145)
Total Bilirubin: 0.8 mg/dL (ref 0.3–1.2)
Total Protein: 7.3 g/dL (ref 6.5–8.1)

## 2017-09-02 LAB — I-STAT BETA HCG BLOOD, ED (MC, WL, AP ONLY): I-stat hCG, quantitative: <5 m[IU]/mL (ref <5)

## 2017-09-02 LAB — LIPASE, BLOOD: LIPASE: 33 U/L (ref 11–51)

## 2017-09-02 MED ORDER — ONDANSETRON HCL 4 MG/2ML IJ SOLN
4.0000 mg | Freq: Once | INTRAMUSCULAR | Status: AC
  Administered 2017-09-02: 4 mg via INTRAVENOUS
  Filled 2017-09-02: qty 2

## 2017-09-02 MED ORDER — SODIUM CHLORIDE 0.9 % IV BOLUS
1000.0000 mL | Freq: Once | INTRAVENOUS | Status: AC
  Administered 2017-09-02: 1000 mL via INTRAVENOUS

## 2017-09-02 NOTE — ED Provider Notes (Signed)
[facility] EMERGENCY DEPARTMENT Provider Note   CSN: [id number] Arrival date & time: 09/02/17  1028     History   Chief Complaint Chief Complaint  Patient presents with  . Emesis    HPI Jeanne Reed is a 27 y.o. female.  HPI  27 year old female complaining of nausea vomiting and diarrhea.  States she had onset of some nasal congestion and cough yesterday.  She was seen by her primary care doctor and started on antibiotics for bronchitis.  She has not tried home antiemetics as she does have Zofran and Phenergan at home.  States that she usually needs to have IV fluids when she has the symptoms.  She denies any urinary tract infection symptoms such as increased frequency of urination or flank pain.  She states she does have a urinary tract infections in the past and is usually able to identify when she has symptoms.  She is currently menstruating.  She is sexually active but is not using any birth control.  She has 58-36-year-old child at home.  She has had some subjective fever and chills.  She is not dyspneic and denies chest pain  Past Medical History:  Diagnosis Date  . GERD (gastroesophageal reflux disease)   . Nausea and vomiting during pregnancy 02/08/2015    Patient Active Problem List   Diagnosis Date Noted  . Anxiety 09/01/2017  . 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 Gastroesophageal reflux disease without esophagitis 01/27/2017  . Marijuana use 08/08/2015  . SCOLIOSIS-IDIOPATHIC 06/03/2007    Past Surgical History:  Procedure Laterality Date  . TONSILLECTOMY    . WISDOM TOOTH EXTRACTION       OB History    Gravida  1   Para  1   Term  1   Preterm      AB      Living  1     SAB      TAB      Ectopic      Multiple  0   Live Births  1            Home Medications    Prior to Admission medications   Medication Sig Start Date End Date Taking? Authorizing Provider  benzonatate (TESSALON) 100 MG capsule Take 1 capsule (100 mg  total) by mouth 3 (three) times daily as needed for cough. 09/01/17   Campbell Riches, NP  clarithromycin (BIAXIN) 500 MG tablet Take 1 tablet (500 mg total) by mouth 2 (two) times daily. 09/01/17   Campbell Riches, NP  escitalopram (LEXAPRO) 10 MG tablet Take 1 tablet (10 mg total) by mouth daily. 09/01/17 09/01/18  Campbell Riches, NP  Norethindrone-Ethinyl Estradiol-Fe Biphas (LO LOESTRIN FE) 1 MG-10 MCG / 10 MCG tablet Take 1 tablet by mouth daily. 09/01/17   Campbell Riches, NP  ondansetron (ZOFRAN ODT) 4 MG disintegrating tablet Take 1 tablet (4 mg total) by mouth every 8 (eight) hours as needed for nausea or vomiting. Patient not taking: Reported on 09/01/2017 05/05/17   Merlyn Albert, MD  pantoprazole (PROTONIX) 40 MG tablet Take 1 tablet (40 mg total) by mouth 2 (two) times daily before a meal. 09/01/17   Campbell Riches, NP  valACYclovir (VALTREX) 1000 MG tablet Take 1 tablet (1,000 mg total) by mouth daily. 06/11/17   Campbell Riches, NP    Family History Family History  Problem Relation Age of Onset  . Cancer Maternal Grandfather   . Diabetes Maternal  Grandfather   . Cancer Paternal Grandmother   . Emphysema Paternal Grandfather     Social History Social History   Tobacco Use  . Smoking status: Never Smoker  . Smokeless tobacco: Never Used  Substance Use Topics  . Alcohol use: No  . Drug use: No    Types: Marijuana    Comment: 3-4 times daily, uses daily     Allergies   Patient has no known allergies.   Review of Systems Review of Systems  All other systems reviewed and are negative.    Physical Exam Updated Vital Signs BP 103/71   Pulse 87   Temp 98.1 F (36.7 C) (Oral)   Resp 16   Ht 1.676 m ( )   Wt 71.7 kg (158 lb)   SpO2 95%   BMI 25.50 kg/m   Physical Exam  Constitutional: She is oriented to person, place, and time. She appears well-developed and well-nourished.  HENT:  Head: Normocephalic and atraumatic.  Right Ear:  External ear normal.  Left Ear: External ear normal.  Nose: Nose normal.  Mouth/Throat: Oropharynx is clear and moist.  Eyes: Pupils are equal, round, and reactive to light. EOM are normal.  Neck: Normal range of motion. Neck supple.  Cardiovascular: Normal rate, regular rhythm, normal heart sounds and intact distal pulses.  Pulmonary/Chest: Effort normal and breath sounds normal.  Abdominal: Soft. Bowel sounds are normal.  Musculoskeletal: Normal range of motion.  Neurological: She is alert and oriented to person, place, and time.  Skin: Skin is warm and dry. Capillary refill takes less than 2 seconds.  Psychiatric: She has a normal mood and affect. Her behavior is normal.  Nursing note and vitals reviewed.    ED Treatments / Results  Labs (all labs ordered are listed, but only abnormal results are displayed) Labs Reviewed  COMPREHENSIVE METABOLIC PANEL - Abnormal; Notable for the following components:      Result Value   Glucose, Bld 104 (*)    Calcium 8.6 (*)    All other components within normal limits  LIPASE, BLOOD  CBC  URINALYSIS, ROUTINE W REFLEX MICROSCOPIC  I-STAT BETA HCG BLOOD, ED ([facility], [facility], [facility] ONLY)   Patient received 2 L normal saline and Zofran.  She feels improved and is tolerating fluids EKG None  Radiology No results found.  Procedures Procedures (including critical care time)  Medications Ordered in ED Medications - No data to display   Initial Impression / Assessment and Plan / ED Course  I have reviewed the triage vital signs and the nursing notes.  Pertinent labs & imaging results that were available during my care of the patient were reviewed by me and considered in my medical decision making (see chart for details).    Nausea vomiting with URI symptoms.  Patient received IV fluids and antiemetics.  She is not tolerating fluids and feels improved.  She has antiemetics at home.  We have discussed oral rehydration, need for follow-up, and return  precautions and she voices understanding.  Final Clinical Impressions(s) / ED Diagnoses   Final diagnoses:  Nausea vomiting and diarrhea    ED Discharge Orders    None       Margarita Grizzle, MD 09/02/17 1450

## 2017-09-02 NOTE — ED Triage Notes (Signed)
Pt c/o of n/v/d since yesterday.  Denies any fever.  Unable to tolerate food.

## 2017-09-03 ENCOUNTER — Telehealth: Payer: Self-pay | Admitting: Family Medicine

## 2017-09-03 MED ORDER — CEFPROZIL 500 MG PO TABS: 500.0000 mg | ORAL_TABLET | Freq: Two times a day (BID) | ORAL | 0 refills | Status: AC

## 2017-09-03 NOTE — Telephone Encounter (Signed)
Pt states that she has a metal taste in her mouth and that taste is making her nauseated.

## 2017-09-03 NOTE — Telephone Encounter (Signed)
Patient said that the antibiotic she was given by Eber Jones on 09/01/17 is giving her a bad taste in her mouth and making her even sicker.  She is requesting a different medication called in.   [facility]

## 2017-09-03 NOTE — Telephone Encounter (Signed)
Stop Biaxin.  Recommend Cefzil 500 mg 1 twice daily 10 days, recommend follow-up if ongoing troubles

## 2017-09-03 NOTE — Telephone Encounter (Signed)
I sent in new medication to [facility] and left a message asked that she r/c so I can notify.

## 2017-09-03 NOTE — Telephone Encounter (Signed)
Patient is aware 

## 2017-09-09 ENCOUNTER — Other Ambulatory Visit: Payer: Self-pay | Admitting: Sports Medicine

## 2017-09-09 ENCOUNTER — Ambulatory Visit (HOSPITAL_COMMUNITY)
Admission: RE | Admit: 2017-09-09 | Discharge: 2017-09-09 | Disposition: A | Discharge status: Home / Self Care | Payer: Medicaid Other | Source: Ambulatory Visit | Attending: Nurse Practitioner | Admitting: Nurse Practitioner

## 2017-09-09 DIAGNOSIS — R1011 Right upper quadrant pain: Secondary | ICD-10-CM | POA: Insufficient documentation

## 2017-09-09 DIAGNOSIS — M545 Low back pain: Secondary | ICD-10-CM

## 2017-09-12 ENCOUNTER — Encounter: Payer: Self-pay | Admitting: Family Medicine

## 2017-09-12 ENCOUNTER — Ambulatory Visit: Payer: Medicaid Other | Admitting: Family Medicine

## 2017-09-12 VITALS — BP 110/74 | Ht 66.0 in | Wt 159.0 lb

## 2017-09-12 DIAGNOSIS — R300 Dysuria: Secondary | ICD-10-CM | POA: Diagnosis not present

## 2017-09-12 LAB — POCT URINALYSIS DIPSTICK
Spec Grav, UA: 1.025 (ref 1.010–1.025)
pH, UA: 6 (ref 5.0–8.0)

## 2017-09-12 MED ORDER — PHENAZOPYRIDINE HCL 200 MG PO TABS: 200.0000 mg | ORAL_TABLET | Freq: Three times a day (TID) | ORAL | 0 refills | Status: DC | PRN

## 2017-09-12 MED ORDER — VALACYCLOVIR HCL 1 G PO TABS: 1000.0000 mg | ORAL_TABLET | Freq: Two times a day (BID) | ORAL | 11 refills | Status: DC

## 2017-09-12 MED ORDER — DOXYCYCLINE HYCLATE 100 MG PO CAPS: 100.0000 mg | ORAL_CAPSULE | Freq: Two times a day (BID) | ORAL | 0 refills | Status: DC

## 2017-09-12 NOTE — Progress Notes (Signed)
   Subjective:    Patient ID: Jeanne Reed, female    DOB: 11-05-1990, 27 y.o.   MRN: [id number]  HPI  Patient states it has hurt to urinate since Wednesday she has had some burning and stinging when urinating. She has been taking cystex. She relates that urinary tract infections are often around having sexual relations she states her current partner is a new partner.  She denies any other symptoms does not think she has STD Results for orders placed or performed in visit on 09/12/17  POCT urinalysis dipstick  Result Value Ref Range   Color, UA     Clarity, UA     Glucose, UA  Negative   Bilirubin, UA     Ketones, UA     Spec Grav, UA 1.025 1.010 - 1.025   Blood, UA Trace    pH, UA 6.0 5.0 - 8.0   Protein, UA  Negative   Urobilinogen, UA  0.2 or 1.0 E.U./dL   Nitrite, UA     Leukocytes, UA Trace (A) Negative   Appearance     Odor       Review of Systems Denies high fever chills sweats denies nausea vomiting diarrhea denies cough wheezing difficulty breathing    Objective:   Physical Exam Lungs clear respiratory rate normal heart regular no murmurs flank nontender abdomen is soft subjective discomfort in the lower pelvic region       Assessment & Plan:  UTI- based upon WBCs seen under the urine/microscope Add additional antibiotic Check GC chlamydia Check urine culture Await results Follow-up if problems Warning signs discussed No sign of complication

## 2017-09-14 LAB — URINE CULTURE

## 2017-09-15 ENCOUNTER — Encounter: Payer: Self-pay | Admitting: Nurse Practitioner

## 2017-09-15 ENCOUNTER — Ambulatory Visit: Payer: Medicaid Other | Admitting: Nurse Practitioner

## 2017-09-15 VITALS — BP 108/76 | Ht 66.0 in | Wt 157.0 lb

## 2017-09-15 DIAGNOSIS — N912 Amenorrhea, unspecified: Secondary | ICD-10-CM | POA: Diagnosis not present

## 2017-09-15 DIAGNOSIS — F419 Anxiety disorder, unspecified: Secondary | ICD-10-CM

## 2017-09-15 DIAGNOSIS — K219 Gastro-esophageal reflux disease without esophagitis: Secondary | ICD-10-CM | POA: Diagnosis not present

## 2017-09-15 LAB — POCT URINE PREGNANCY: PREG TEST UR: NEGATIVE

## 2017-09-16 ENCOUNTER — Encounter: Payer: Self-pay | Admitting: Nurse Practitioner

## 2017-09-16 ENCOUNTER — Telehealth: Payer: Self-pay | Admitting: *Deleted

## 2017-09-16 LAB — CHLAMYDIA/GONOCOCCUS/TRICHOMONAS, NAA: TRICH VAG BY NAA: NEGATIVE

## 2017-09-16 NOTE — Progress Notes (Signed)
Subjective: Presents for recheck on her reflux.  Currently taking Protonix twice a day.  No further burning.  Cough is greatly improved.  Has an occasional gas bubble going up from the abdomen through the chest but otherwise much better.  Was prescribed her new birth control pill, her last cycle was a week ago but patient was late starting the pill, started it yesterday.  Has had unprotected sex.  Has not started her Lexapro at this point.  Was concerned because she was taking so many pills.  Was seen in our office on 5/31 for dysuria.  Has not received results of urine culture at this point.  Was having spasms with urination.  Some relief with OTC Pyridium.  No fever.  Has also noticed a flare up of her genital herpes, dosage of Valtrex was increased to twice daily at that visit.  No dysuria at this point.  Objective:   BP 108/76   Ht 5\' 6"  (1.676 m)   Wt 157 lb 0.6 oz (71.2 kg)   BMI 25.35 kg/m  NAD.  Alert, oriented.  Lungs clear.  Heart regular rate and rhythm.  Abdomen soft nondistended with distinct epigastric area tenderness. Quantitative hCG done 09/02/2017 was negative and urine hCG here in the office is negative.  Urine culture dated 09/12/2017 was negative, GC and Chlamydia testing pending.   Assessment:   Problem List Items Addressed This Visit      Digestive   Gastroesophageal reflux disease without esophagitis - Primary   Relevant Orders   Ambulatory referral to Gastroenterology     Other   Anxiety   Relevant Medications   escitalopram (LEXAPRO) 10 MG tablet    Other Visit Diagnoses    Amenorrhea       Relevant Orders   POCT urine pregnancy (Completed)       Plan: Refer to gastroenterology for evaluation of GERD.  Call back in the meantime if symptoms worsen.  Discussed safe sex issues.  Due to the continued level of stress and anxiety, recommend she start trial of Lexapro as planned.  DC med and call if any problems.  Recheck in 2 to 3 months if she starts medication.   Warning signs reviewed.  25 minutes was spent with the patient.  This statement verifies that 25 minutes was indeed spent with the patient. Greater than half the time was spent in discussion, counseling and answering questions  regarding the issues that the patient came in for today as reflected in the diagnosis (s) please refer to documentation for further details.

## 2017-09-16 NOTE — Telephone Encounter (Signed)
Campbell RichesHoskins, Carolyn C, NP          Please call patient and verify all of her medications. We have report from outside from 5/28 with different medications. I know she has not started lexapro yet.

## 2017-09-16 NOTE — Telephone Encounter (Signed)
Contacted patient and states that she is taking Tessalon PRN, Doxy, Lexapro, Lo Lestrin, Protonix, Ultram PRN, and Valtrex. Pt states she is not taking the Clarithromycin.

## 2017-09-17 ENCOUNTER — Telehealth: Payer: Self-pay | Admitting: Family Medicine

## 2017-09-17 NOTE — Addendum Note (Signed)
Addended by: Marlowe ShoresMANLEY, Lakesa Coste on: 09/17/2017 04:21 PM   Modules accepted: Orders

## 2017-09-17 NOTE — Telephone Encounter (Signed)
Review lab results from [facility] in results folder. °

## 2017-09-17 NOTE — Telephone Encounter (Signed)
Please update her med list to reflect this. Thanks!

## 2017-09-17 NOTE — Telephone Encounter (Signed)
Updated list in epic.

## 2017-09-18 NOTE — Telephone Encounter (Signed)
Does she already have this antibx? If not which antibx would you like for us to send in. Please advise.

## 2017-09-18 NOTE — Telephone Encounter (Signed)
Please include chart records, form completed put our stamp and phone number on the bottom please

## 2017-09-18 NOTE — Telephone Encounter (Signed)
Urine culture was essentially negative.  GC chlamydia negative.  I recommend doing 7 days of the antibiotic and then stopping.  Follow-up if any progressive troubles

## 2017-09-18 NOTE — Telephone Encounter (Signed)
Re-routing

## 2017-09-18 NOTE — Telephone Encounter (Signed)
Doxy on the 31st

## 2017-09-18 NOTE — Telephone Encounter (Signed)
Patient is aware 

## 2017-09-22 ENCOUNTER — Encounter: Payer: Self-pay | Admitting: Family Medicine

## 2017-09-22 ENCOUNTER — Encounter (INDEPENDENT_AMBULATORY_CARE_PROVIDER_SITE_OTHER): Payer: Self-pay

## 2017-09-25 ENCOUNTER — Other Ambulatory Visit: Payer: Self-pay | Admitting: *Deleted

## 2017-09-25 ENCOUNTER — Telehealth: Payer: Self-pay | Admitting: Family Medicine

## 2017-09-25 MED ORDER — SULFACETAMIDE SODIUM 10 % OP SOLN: OPHTHALMIC | 0 refills | Status: DC

## 2017-09-25 NOTE — Telephone Encounter (Signed)
Sulfacetamide eye drops. 2 drops to affected eye qid for 3 -5 days per dr Lorin Picketscott. Pt to follow up in office if not better or if worse. Med sent to pharm. Pt notified.

## 2017-09-25 NOTE — Telephone Encounter (Signed)
Patient said she woke up this morning with her eye red and yellow gunk. She is requesting drops called in for pink eye.  [facility]

## 2017-09-29 ENCOUNTER — Encounter: Payer: Self-pay | Admitting: Gastroenterology

## 2017-09-30 ENCOUNTER — Ambulatory Visit
Admission: RE | Admit: 2017-09-30 | Discharge: 2017-09-30 | Disposition: A | Discharge status: Home / Self Care | Payer: Medicaid Other | Source: Ambulatory Visit | Attending: Sports Medicine | Admitting: Sports Medicine

## 2017-09-30 DIAGNOSIS — M545 Low back pain: Secondary | ICD-10-CM

## 2017-10-01 ENCOUNTER — Other Ambulatory Visit: Payer: Self-pay | Admitting: Nurse Practitioner

## 2017-10-06 ENCOUNTER — Encounter: Payer: Self-pay | Admitting: Nurse Practitioner

## 2017-10-06 ENCOUNTER — Ambulatory Visit: Payer: Medicaid Other | Admitting: Nurse Practitioner

## 2017-10-06 VITALS — BP 114/72 | Temp 98.1°F | Ht 66.0 in | Wt 154.0 lb

## 2017-10-06 DIAGNOSIS — R05 Cough: Secondary | ICD-10-CM | POA: Diagnosis not present

## 2017-10-06 DIAGNOSIS — J019 Acute sinusitis, unspecified: Secondary | ICD-10-CM

## 2017-10-06 DIAGNOSIS — B9689 Other specified bacterial agents as the cause of diseases classified elsewhere: Secondary | ICD-10-CM

## 2017-10-06 DIAGNOSIS — R059 Cough, unspecified: Secondary | ICD-10-CM

## 2017-10-06 DIAGNOSIS — J029 Acute pharyngitis, unspecified: Secondary | ICD-10-CM | POA: Diagnosis not present

## 2017-10-06 DIAGNOSIS — H1013 Acute atopic conjunctivitis, bilateral: Secondary | ICD-10-CM

## 2017-10-06 MED ORDER — LEVOFLOXACIN 500 MG PO TABS: 500.0000 mg | ORAL_TABLET | Freq: Every day | ORAL | 0 refills | Status: DC

## 2017-10-06 MED ORDER — SUCRALFATE 1 G PO TABS: 1.0000 g | ORAL_TABLET | Freq: Three times a day (TID) | ORAL | 2 refills | Status: DC

## 2017-10-06 MED ORDER — OLOPATADINE HCL 0.2 % OP SOLN: OPHTHALMIC | 0 refills | Status: DC

## 2017-10-07 ENCOUNTER — Ambulatory Visit: Payer: Medicaid Other | Admitting: Family Medicine

## 2017-10-07 ENCOUNTER — Encounter: Payer: Self-pay | Admitting: Nurse Practitioner

## 2017-10-07 NOTE — Progress Notes (Signed)
Subjective: Presents with complaints of sore throat with painful swallowing for the past 10+ days.  Bad cough at times.  No fever.  Facial area headache.  Runny nose.  Frequent cough day and night producing slightly yellow mucus.  No wheezing.  Bilateral ear pain.  No nausea vomiting or diarrhea.  No abdominal pain.  Taking Protonix twice daily for her reflux which seems to be under good control except when she eats certain foods.  Describes a rattle in her chest area in the mornings.  Minimal relief with an inhaler.  Does smoke marijuana on a regular basis.  Also itchy watery eyes with slight crusting in the mornings.  Objective:   BP 114/72   Temp 98.1 F (36.7 C) (Oral)   Ht 5\' 6"  (1.676 m)   Wt 154 lb (69.9 kg)   SpO2 96%   BMI 24.86 kg/m  NAD.  Alert, oriented.  Conjunctiva mildly injected BiLAP, no preauricular adenopathy.  TMs retracted, no erythema.  Pharynx non-erythematous with PND noted.  Neck supple with mild soft anterior adenopathy.  Indicates the base of the throat near the thyroid gland as the area of her tenderness.  Thyroid nontender to palpation, no mass or goiter noted.  Lungs clear.  Heart regular rate rhythm.  Abdomen soft nondistended with distinct epigastric area tenderness.  Assessment:  Acute bacterial rhinosinusitis - Plan: DG Chest 2 View, Monospot, CBC with Differential/Platelet, TSH, Thyroid antibodies  Allergic conjunctivitis of both eyes - Plan: DG Chest 2 View, Monospot, CBC with Differential/Platelet, TSH, Thyroid antibodies  Cough - Plan: DG Chest 2 View, Monospot, CBC with Differential/Platelet, TSH, Thyroid antibodies  Pharyngitis, unspecified etiology - Plan: DG Chest 2 View, Monospot, CBC with Differential/Platelet, TSH, Thyroid antibodies    Plan:   Meds ordered this encounter  Medications  . levofloxacin (LEVAQUIN) 500 MG tablet    Sig: Take 1 tablet (500 mg total) by mouth daily.    Dispense:  10 tablet    Refill:  0    Order Specific Question:    Supervising Provider    Answer:   Merlyn Albert [[id number]]  . sucralfate (CARAFATE) 1 g tablet    Sig: Take 1 tablet (1 g total) by mouth 4 (four) times daily -  with meals and at bedtime. Prn acid reflux    Dispense:  40 tablet    Refill:  2    Order Specific Question:   Supervising Provider    Answer:   Merlyn Albert [[id number]]  . Olopatadine HCl (PATADAY) 0.2 % SOLN    Sig: Apply one gtt ou qd prn    Dispense:  1 Bottle    Refill:  0    Order Specific Question:   Supervising Provider    Answer:   Merlyn Albert [[id number]]   Continue Protonix twice daily as directed.  Add Carafate as directed for breakthrough symptoms.  Trial of Pataday for her eye symptoms.  Start Levaquin as directed.  Denies possibility of pregnancy.  Due to the length of time patient has had cough and sore throat and the location of her symptoms, labs and x-ray are pending.  Further follow-up based on test results, patient to call back in the meantime if symptoms worsen.  Referral has been made for her to GI specialist but first available appointment was in September. 25 minutes was spent with the patient.  This statement verifies that 25 minutes was indeed spent with the patient. Greater than half the time was spent  in discussion, counseling and answering questions  regarding the issues that the patient came in for today as reflected in the diagnosis (s) please refer to documentation for further details.

## 2017-10-08 LAB — THYROID ANTIBODIES
Thyroglobulin Antibody: <1 IU/mL (ref 0.0–0.9)
Thyroperoxidase Ab SerPl-aCnc: 9 IU/mL (ref 0–34)

## 2017-10-08 LAB — CBC WITH DIFFERENTIAL/PLATELET
BASOS ABS: 0 10*3/uL (ref 0.0–0.2)
BASOS: 0 %
EOS (ABSOLUTE): 0.1 10*3/uL (ref 0.0–0.4)
EOS: 2 %
Hematocrit: 42.6 % (ref 34.0–46.6)
Hemoglobin: 14.5 g/dL (ref 11.1–15.9)
IMMATURE GRANS (ABS): 0 10*3/uL (ref 0.0–0.1)
Immature Granulocytes: 0 %
LYMPHS: 31 %
Lymphocytes Absolute: 2.2 10*3/uL (ref 0.7–3.1)
MCH: 30.7 pg (ref 26.6–33.0)
MCHC: 34 g/dL (ref 31.5–35.7)
MCV: 90 fL (ref 79–97)
Monocytes Absolute: 0.5 10*3/uL (ref 0.1–0.9)
Monocytes: 7 %
NEUTROS ABS: 4.2 10*3/uL (ref 1.4–7.0)
Neutrophils: 60 %
PLATELETS: 260 10*3/uL (ref 150–450)
RBC: 4.73 x10E6/uL (ref 3.77–5.28)
RDW: 13.9 % (ref 12.3–15.4)
WBC: 7.1 10*3/uL (ref 3.4–10.8)

## 2017-10-08 LAB — TSH: TSH: 1.47 u[IU]/mL (ref 0.450–4.500)

## 2017-10-08 LAB — MONONUCLEOSIS SCREEN: Mono Screen: NEGATIVE

## 2017-10-23 DIAGNOSIS — M545 Low back pain: Secondary | ICD-10-CM | POA: Diagnosis not present

## 2017-10-31 DIAGNOSIS — M545 Low back pain: Secondary | ICD-10-CM | POA: Diagnosis not present

## 2017-11-04 DIAGNOSIS — M545 Low back pain: Secondary | ICD-10-CM | POA: Diagnosis not present

## 2017-11-07 DIAGNOSIS — M4846XG Fatigue fracture of vertebra, lumbar region, subsequent encounter for fracture with delayed healing: Secondary | ICD-10-CM | POA: Diagnosis not present

## 2017-11-07 DIAGNOSIS — M545 Low back pain: Secondary | ICD-10-CM | POA: Diagnosis not present

## 2017-12-05 DIAGNOSIS — M545 Low back pain: Secondary | ICD-10-CM | POA: Diagnosis not present

## 2017-12-05 DIAGNOSIS — M4846XG Fatigue fracture of vertebra, lumbar region, subsequent encounter for fracture with delayed healing: Secondary | ICD-10-CM | POA: Diagnosis not present

## 2017-12-26 ENCOUNTER — Encounter: Payer: Self-pay | Admitting: *Deleted

## 2017-12-26 ENCOUNTER — Other Ambulatory Visit: Payer: Self-pay | Admitting: *Deleted

## 2017-12-26 ENCOUNTER — Encounter: Payer: Self-pay | Admitting: Gastroenterology

## 2017-12-26 ENCOUNTER — Ambulatory Visit: Payer: Medicaid Other | Admitting: Gastroenterology

## 2017-12-26 VITALS — BP 111/73 | HR 96 | Temp 97.3°F | Ht 66.0 in | Wt 155.2 lb

## 2017-12-26 DIAGNOSIS — R1310 Dysphagia, unspecified: Secondary | ICD-10-CM | POA: Diagnosis not present

## 2017-12-26 DIAGNOSIS — K219 Gastro-esophageal reflux disease without esophagitis: Secondary | ICD-10-CM

## 2017-12-26 LAB — HCG, QUANTITATIVE, PREGNANCY: HCG, Total, QN: 270 m[IU]/mL

## 2017-12-26 MED ORDER — ONDANSETRON HCL 4 MG PO TABS: 4.0000 mg | ORAL_TABLET | Freq: Three times a day (TID) | ORAL | 3 refills | Status: DC | PRN

## 2017-12-26 NOTE — Progress Notes (Signed)
cc'ed to pcp °

## 2017-12-26 NOTE — Progress Notes (Addendum)
REVIEWED. Pt a no show for preop visit. EGD/DIL canceled. Will need OPV PRIOR TO [facility] OF EGD. PT HAS POS PREGNANCY TEST.  Primary Care Physician:  Babs Sciara, MD Primary Gastroenterologist:  Dr. Darrick Penna   Chief Complaint  Patient presents with  . Gastroesophageal Reflux    HPI:   Jeanne Reed is a 27 y.o. female presenting today at the request of Sherie Don, NP, due to chronic GERD.  History of endoscopy at age 38 at [facility]. Doesn't remember results, but she was started on a PPI. Taking Protonix BID, Zantac and Nexium as needed. Started adding on Zantac and Nexium when pregnant but couldn't come off. Took omeprazole for awhile. Never taken Dexilant. Occasional solid food dysphagia. Will wake up in the morning and throw up an entire meal she ate the night before. Vomiting a meal is not often; however, will vomit up acid every few weeks. Wakes up each morning feeling nauseated and gagging. Sometimes eating later at night. 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 Was told that she digested food at a slower pace than normal at age 40. (?GES). No unexplained weight loss or lack of appetite. No abdominal pain. Has chest pain/burning. Has chronic cough.   Ibuprofen, Advil sometimes daily to every other day. Spinal injury in January. Fractured spine in 2 places while lifting a tea urn at work. Felt a pop and whole back spasms.   Past Medical History:  Diagnosis Date  . GERD (gastroesophageal reflux disease)   . Nausea and vomiting during pregnancy 02/08/2015    Past Surgical History:  Procedure Laterality Date  . TONSILLECTOMY    . WISDOM TOOTH EXTRACTION      Current Outpatient Medications  Medication Sig Dispense Refill  . esomeprazole (NEXIUM) 20 MG capsule Take 20 mg by mouth daily at 12 noon.    . Olopatadine HCl (PATADAY) 0.2 % SOLN Apply one gtt ou qd prn 1 Bottle 0  . pantoprazole (PROTONIX) 40 MG tablet TAKE 1 TABLET BY MOUTH TWICE DAILY BEFORE A MEAL 60 tablet 5  .  ranitidine (ZANTAC) 150 MG tablet Take 150 mg by mouth daily.    . traMADol (ULTRAM) 50 MG tablet Take 1 tablet by mouth 2 (two) times daily as needed.  0  . valACYclovir (VALTREX) 1000 MG tablet Take 1 tablet (1,000 mg total) by mouth 2 (two) times daily. 60 tablet 11   No current facility-administered medications for this visit.     Allergies as of 12/26/2017  . (No Known Allergies)    Family History  Problem Relation Age of Onset  . Cancer Maternal Grandfather   . Diabetes Maternal Grandfather   . Cancer Paternal Grandmother   . Colon cancer Paternal Grandmother   . Emphysema Paternal Grandfather   . Colon polyps Neg Hx     Social History   Socioeconomic History  . Marital status: Single    Spouse name: Not on file  . Number of children: 1  . Years of education: Not on file  . Highest education level: Not on file  Occupational History  . Occupation: [employer]  Social Needs  . Financial resource strain: Not on file  . Food insecurity:    Worry: Not on file    Inability: Not on file  . Transportation needs:    Medical: Not on file    Non-medical: Not on file  Tobacco Use  . Smoking status: Never Smoker  . Smokeless tobacco: Never Used  Substance  and Sexual Activity  . Alcohol use: No  . 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 Sexual activity: Yes  Lifestyle  . Physical activity:    Days per week: Not on file    Minutes per session: Not on file  . Stress: Not on file  Relationships  . Social connections:    Talks on phone: Not on file    Gets together: Not on file    Attends religious service: Not on file    Active member of club or organization: Not on file    Attends meetings of clubs or organizations: Not on file    Relationship status: Not on file  . Intimate partner violence:    Fear of current or ex partner: Not on file    Emotionally abused: Not on file    Physically abused: Not on file    Forced sexual activity:  Not on file  Other Topics Concern  . Not on file  Social History Narrative   Two-year-old girl, Callie.     Review of Systems: Gen: Denies any fever, chills, fatigue, weight loss, lack of appetite.  CV: Denies chest pain, heart palpitations, peripheral edema, syncope.  Resp: Denies shortness of breath at rest or with exertion. Denies wheezing or cough.  GI: see HPI . GU : Denies urinary burning, urinary frequency, urinary hesitancy MS: Denies joint pain, muscle weakness, cramps, or limitation of movement.  Derm: Denies rash, itching, dry skin Psych: Denies depression, anxiety, memory loss, and confusion Heme: Denies bruising, bleeding, and enlarged lymph nodes.  Physical Exam: BP 111/73   Pulse 96   Temp (!) 97.3 F (36.3 C) (Oral)   Ht 5\' 6"  (1.676 m)   Wt 155 lb 3.2 oz (70.4 kg)   LMP 11/24/2017 (Exact Date)   BMI 25.05 kg/m  General:   Alert and oriented. Pleasant and cooperative. Well-nourished and well-developed.  Head:  Normocephalic and atraumatic. Eyes:  Without icterus, sclera clear and conjunctiva pink.  Ears:  Normal auditory acuity. Nose:  No deformity, discharge,  or lesions. Lungs:  Clear to auscultation bilaterally. No wheezes, rales, or rhonchi. No distress.  Heart:  S1, S2 present without murmurs appreciated.  Abdomen:  +BS, soft, non-tender and non-distended. No HSM noted. No guarding or rebound. No masses appreciated.  Rectal:  Deferred  Msk:  Symmetrical without gross deformities. Normal posture. Extremities:  Without edema. Neurologic:  Alert and  oriented x4 Psych:  Alert and cooperative. Normal mood and affect.

## 2017-12-26 NOTE — Patient Instructions (Addendum)
For now, let's stop Protonix. Start the samples of Dexilant once daily. Let me know how this works for you, and I will send in a prescription. I sent Zofran to the pharmacy to take every 8 hours as needed for nausea.   We have arranged an upper endoscopy with dilation with Dr. Darrick Penna in the near future.  We will see you in 4 months!  It was a pleasure to see you today. I strive to create trusting relationships with patients to provide genuine, compassionate, and quality care. I value your feedback. If you receive a survey regarding your visit,  I greatly appreciate you taking time to fill this out.   Gelene Mink, PhD, ANP-BC [facility]    Gastroesophageal Reflux Disease, Adult Normally, food travels down the esophagus and stays in the stomach to be digested. However, when a person has gastroesophageal reflux disease (GERD), food and stomach acid move back up into the esophagus. When this happens, the esophagus becomes sore and inflamed. Over time, GERD can create small holes (ulcers) in the lining of the esophagus. What are the causes? This condition is caused by a problem with the muscle between the esophagus and the stomach (lower esophageal sphincter, or LES). Normally, the LES muscle closes after food passes through the esophagus to the stomach. When the LES is weakened or abnormal, it does not close properly, and that allows food and stomach acid to go back up into the esophagus. The LES can be weakened by certain dietary substances, medicines, and medical conditions, including:  Tobacco use.  Pregnancy.  Having a hiatal hernia.  Heavy alcohol use.  Certain foods and beverages, such as coffee, chocolate, onions, and peppermint.  What increases the risk? This condition is more likely to develop in:  People who have an increased body weight.  People who have connective tissue disorders.  People who use NSAID medicines.  What are the signs or  symptoms? Symptoms of this condition include:  Heartburn.  Difficult or painful swallowing.  The feeling of having a lump in the throat.  Abitter taste in the mouth.  Bad breath.  Having a large amount of saliva.  Having an upset or bloated stomach.  Belching.  Chest pain.  Shortness of breath or wheezing.  Ongoing (chronic) cough or a night-time cough.  Wearing away of tooth enamel.  Weight loss.  Different conditions can cause chest pain. Make sure to see your health care provider if you experience chest pain. How is this diagnosed? Your health care provider will take a medical history and perform a physical exam. To determine if you have mild or severe GERD, your health care provider may also monitor how you respond to treatment. You may also have other tests, including:  An endoscopy toexamine your stomach and esophagus with a small camera.  A test thatmeasures the acidity level in your esophagus.  A test thatmeasures how much pressure is on your esophagus.  A barium swallow or modified barium swallow to show the shape, size, and functioning of your esophagus.  How is this treated? The goal of treatment is to help relieve your symptoms and to prevent complications. Treatment for this condition may vary depending on how severe your symptoms are. Your health care provider may recommend:  Changes to your diet.  Medicine.  Surgery.  Follow these instructions at home: Diet  Follow a diet as recommended by your health care provider. This may involve avoiding foods and drinks such as: ? Coffee and  tea (with or without caffeine). ? Drinks that containalcohol. ? Energy drinks and sports drinks. ? Carbonated drinks or sodas. ? Chocolate and cocoa. ? Peppermint and mint flavorings. ? Garlic and onions. ? Horseradish. ? Spicy and acidic foods, including peppers, chili powder, curry powder, vinegar, hot sauces, and barbecue sauce. ? Citrus fruit juices and  citrus fruits, such as oranges, lemons, and limes. ? Tomato-based foods, such as red sauce, chili, salsa, and pizza with red sauce. ? Fried and fatty foods, such as donuts, french fries, potato chips, and high-fat dressings. ? High-fat meats, such as hot dogs and fatty cuts of red and white meats, such as rib eye steak, sausage, ham, and bacon. ? High-fat dairy items, such as whole milk, butter, and cream cheese.  Eat small, frequent meals instead of large meals.  Avoid drinking large amounts of liquid with your meals.  Avoid eating meals during the 2-3 hours before bedtime.  Avoid lying down right after you eat.  Do not exercise right after you eat. General instructions  Pay attention to any changes in your symptoms.  Take over-the-counter and prescription medicines only as told by your health care provider. Do not take aspirin, ibuprofen, or other NSAIDs unless your health care provider told you to do so.  Do not use any tobacco products, including cigarettes, chewing tobacco, and e-cigarettes. If you need help quitting, ask your health care provider.  Wear loose-fitting clothing. Do not wear anything tight around your waist that causes pressure on your abdomen.  Raise (elevate) the head of your bed 6 inches (15cm).  Try to reduce your stress, such as with yoga or meditation. If you need help reducing stress, ask your health care provider.  If you are overweight, reduce your weight to an amount that is healthy for you. Ask your health care provider for guidance about a safe weight loss goal.  Keep all follow-up visits as told by your health care provider. This is important. Contact a health care provider if:  You have new symptoms.  You have unexplained weight loss.  You have difficulty swallowing, or it hurts to swallow.  You have wheezing or a persistent cough.  Your symptoms do not improve with treatment.  You have a hoarse voice. Get help right away if:  You have  pain in your arms, neck, jaw, teeth, or back.  You feel sweaty, dizzy, or light-headed.  You have chest pain or shortness of breath.  You vomit and your vomit looks like blood or coffee grounds.  You faint.  Your stool is bloody or black.  You cannot swallow, drink, or eat. This information is not intended to replace advice given to you by your health care provider. Make sure you discuss any questions you have with your health care provider. Document Released: 01/09/2005 Document Revised: 08/30/2015 Document Reviewed: 07/27/2014 Elsevier Interactive Patient Education  [employer]2018 Elsevier Inc.

## 2017-12-26 NOTE — Assessment & Plan Note (Addendum)
27 year old female with chronic GERD, dating back to at least age 27. No significant improvement with Protonix BID, Prilosec, Nexium, H2 blockers prn. She is in fact taking Protonix BID and occasionally Nexium in addition. 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 Reports what sounds like a GES at [facility] as a teenager. Daily marijuana use aggravating symptoms as well. Due to solid food dysphagia, will proceed with EGD/dilation. I anticipate she may need a GES, but we will hold off on this unless EGD is unrevealing.   Proceed with upper endoscopy/dilation in the near future with Dr. Darrick PennaFields. The risks, benefits, and alternatives have been discussed in detail with patient. They have stated understanding and desire to proceed.  Will use Propofol  Pregnancy screen prior  Clear liquids day prior Trial of Dexilant GERD diet provided Zantac at night prn GERD precautions/dietary and behavior modifications discussed Zofran prn nausea/vomiting Consider GES Return in 4 months.

## 2017-12-26 NOTE — Assessment & Plan Note (Signed)
Likely due to uncontrolled GERD, unable to rule out stricture. Dilation as appropriate.

## 2017-12-29 ENCOUNTER — Telehealth: Payer: Self-pay | Admitting: *Deleted

## 2017-12-29 ENCOUNTER — Encounter (HOSPITAL_COMMUNITY): Admission: RE | Admit: 2017-12-29 | Payer: Medicaid Other | Source: Ambulatory Visit

## 2017-12-29 ENCOUNTER — Other Ambulatory Visit: Payer: Self-pay | Admitting: *Deleted

## 2017-12-29 ENCOUNTER — Encounter: Payer: Self-pay | Admitting: *Deleted

## 2017-12-29 DIAGNOSIS — O3680X0 Pregnancy with inconclusive fetal viability, not applicable or unspecified: Secondary | ICD-10-CM

## 2017-12-29 NOTE — Progress Notes (Signed)
Jeanne Reed: her pregnancy screen came back with slightly elevated levels estimating around [redacted] weeks gestation. Is this a urine or blood screen? If urine, let's do the blood sample and WE NEED TO POSTPONE THE EGD WITH PROPOFOL due to likely pregnancy. This is an elective procedure that is not appropriate in this setting. Who is her GYN? We also need to review reflux meds and nausea meds before taking any further. I will look into this for her. When was her last menstrual period?   Please cancel EGD TOMORROW, 9/17.

## 2017-12-29 NOTE — Progress Notes (Signed)
Addendum: I think this was the blood sample, regardless, it is elevated and we need to postpone and have her get in with GYN.

## 2017-12-29 NOTE — Progress Notes (Signed)
PT is aware. She will not go for pre-op today either. She said her last menstrual cycle was August 12-14. She said it was possible that she is pregnant. She has GYN, Dr. Emelda FearFerguson and she will not need a referral. She is aware that Tobi Bastosnna will review meds and let her know. [facility], please cancel the procedure.

## 2017-12-30 ENCOUNTER — Encounter (HOSPITAL_COMMUNITY): Admission: RE | Payer: Self-pay | Source: Ambulatory Visit

## 2017-12-30 ENCOUNTER — Ambulatory Visit (HOSPITAL_COMMUNITY): Admission: RE | Admit: 2017-12-30 | Payer: Medicaid Other | Source: Ambulatory Visit | Admitting: Gastroenterology

## 2017-12-30 SURGERY — ESOPHAGOGASTRODUODENOSCOPY (EGD) WITH PROPOFOL
Anesthesia: Monitor Anesthesia Care

## 2017-12-30 NOTE — Progress Notes (Signed)
Have her stop the Dexilant. For reflux, she can take Prilosec once each morning, 30 minutes before breakfast. May take Zantac prn. Do not take Zofran. In summary: stop Dexilant and Zofran.

## 2017-12-30 NOTE — Progress Notes (Signed)
PT is aware and is waiting a call back for an appt with GYN.

## 2017-12-30 NOTE — Progress Notes (Signed)
Forwarding back to Tobi Bastosnna to review reflux and nausea meds.

## 2018-01-07 DIAGNOSIS — S32009G Unspecified fracture of unspecified lumbar vertebra, subsequent encounter for fracture with delayed healing: Secondary | ICD-10-CM | POA: Diagnosis not present

## 2018-01-09 DIAGNOSIS — M545 Low back pain: Secondary | ICD-10-CM | POA: Diagnosis not present

## 2018-01-09 DIAGNOSIS — M4846XG Fatigue fracture of vertebra, lumbar region, subsequent encounter for fracture with delayed healing: Secondary | ICD-10-CM | POA: Diagnosis not present

## 2018-01-12 ENCOUNTER — Encounter: Payer: Self-pay | Admitting: *Deleted

## 2018-01-12 ENCOUNTER — Telehealth: Payer: Self-pay | Admitting: *Deleted

## 2018-01-12 MED ORDER — DOXYLAMINE-PYRIDOXINE 10-10 MG PO TBEC: DELAYED_RELEASE_TABLET | ORAL | 6 refills | Status: DC

## 2018-01-12 NOTE — Telephone Encounter (Signed)
Unable to leave message on VM

## 2018-01-19 ENCOUNTER — Ambulatory Visit (HOSPITAL_COMMUNITY): Payer: Medicaid Other

## 2018-01-20 DIAGNOSIS — S32009G Unspecified fracture of unspecified lumbar vertebra, subsequent encounter for fracture with delayed healing: Secondary | ICD-10-CM | POA: Diagnosis not present

## 2018-02-12 ENCOUNTER — Telehealth: Payer: Self-pay | Admitting: Family Medicine

## 2018-02-12 NOTE — Telephone Encounter (Signed)
Patient was recently on Norethindrone-Ethinyl Estradiol-Fe Biphas (LO LOESTRIN FE) 1 MG-10 MCG / 10 MCG tablet Patient stated she stopped taking her Birth Control around May/June she states she didn't even finish taking a whole pack of this medication. Patient would like to know if she could get a different type of Birth Control sent in to [id number] - [place], [place] - [address] without having to come in for a follow up appt.   Last office visit: 09/15/2017

## 2018-02-13 ENCOUNTER — Other Ambulatory Visit: Payer: Self-pay | Admitting: Family Medicine

## 2018-02-13 MED ORDER — NORGESTIM-ETH ESTRAD TRIPHASIC 0.18/0.215/0.25 MG-25 MCG PO TABS: 1.0000 | ORAL_TABLET | Freq: Every day | ORAL | 5 refills | Status: DC

## 2018-02-13 NOTE — Telephone Encounter (Signed)
Will be happy to send in additional birth control pills but would like to find out from the patient was 0 problem with the previous one in regards to how she did or did not like it?

## 2018-02-13 NOTE — Telephone Encounter (Signed)
Pt contacted and verbalized understanding.  

## 2018-02-13 NOTE — Telephone Encounter (Signed)
Per pt the medication made her nauseous and had headaches from it. She wants to be switched back to the Ortho tricycline lo

## 2018-02-13 NOTE — Telephone Encounter (Signed)
Prescription was sent into [facility]

## 2018-02-17 DIAGNOSIS — S32028A Other fracture of second lumbar vertebra, initial encounter for closed fracture: Secondary | ICD-10-CM | POA: Diagnosis not present

## 2018-03-03 DIAGNOSIS — M545 Low back pain: Secondary | ICD-10-CM | POA: Diagnosis not present

## 2018-03-03 DIAGNOSIS — S32009G Unspecified fracture of unspecified lumbar vertebra, subsequent encounter for fracture with delayed healing: Secondary | ICD-10-CM | POA: Diagnosis not present

## 2018-03-05 DIAGNOSIS — K219 Gastro-esophageal reflux disease without esophagitis: Secondary | ICD-10-CM | POA: Diagnosis not present

## 2018-03-05 DIAGNOSIS — Z8781 Personal history of (healed) traumatic fracture: Secondary | ICD-10-CM | POA: Diagnosis not present

## 2018-03-05 DIAGNOSIS — S32000S Wedge compression fracture of unspecified lumbar vertebra, sequela: Secondary | ICD-10-CM | POA: Diagnosis not present

## 2018-03-11 ENCOUNTER — Other Ambulatory Visit (HOSPITAL_COMMUNITY): Payer: Self-pay | Admitting: Orthopedic Surgery

## 2018-03-11 DIAGNOSIS — S32009G Unspecified fracture of unspecified lumbar vertebra, subsequent encounter for fracture with delayed healing: Secondary | ICD-10-CM

## 2018-03-19 ENCOUNTER — Telehealth: Payer: Self-pay | Admitting: Family Medicine

## 2018-03-19 MED ORDER — FAMOTIDINE 40 MG PO TABS: 40.0000 mg | ORAL_TABLET | Freq: Every day | ORAL | 5 refills | Status: DC

## 2018-03-19 NOTE — Telephone Encounter (Signed)
Medication sent [facility] to [facility] [place]. Tried to call the pt,vm full unable to reach.

## 2018-03-19 NOTE — Telephone Encounter (Signed)
Pt would like a medication similar to ranitidine (ZANTAC) 150 MG tablet  to be called in for her if possible, pt states Zantac seems to not be working as well for her. Advise.   Pharmacy:  [facility] [address] - [place], [place] - [address]

## 2018-03-19 NOTE — Telephone Encounter (Signed)
pepcid 40mg ,1 qd , #30, 5 rf  Dc ranitidine

## 2018-03-19 NOTE — Telephone Encounter (Signed)
Please advise 

## 2018-03-20 NOTE — Telephone Encounter (Signed)
Patient is aware of all. 

## 2018-03-20 NOTE — Telephone Encounter (Signed)
Informed pt medication has been sent to [facility] [place], pt advised understanding.

## 2018-03-24 ENCOUNTER — Encounter (HOSPITAL_COMMUNITY)
Admission: RE | Admit: 2018-03-24 | Discharge: 2018-03-24 | Disposition: A | Discharge status: Home / Self Care | Payer: Medicaid Other | Source: Ambulatory Visit | Attending: Orthopedic Surgery | Admitting: Orthopedic Surgery

## 2018-03-24 DIAGNOSIS — S32028A Other fracture of second lumbar vertebra, initial encounter for closed fracture: Secondary | ICD-10-CM | POA: Diagnosis not present

## 2018-03-24 DIAGNOSIS — S32009G Unspecified fracture of unspecified lumbar vertebra, subsequent encounter for fracture with delayed healing: Secondary | ICD-10-CM | POA: Insufficient documentation

## 2018-03-24 MED ORDER — TECHNETIUM TC 99M MEDRONATE IV KIT
20.0000 | PACK | Freq: Once | INTRAVENOUS | Status: AC | PRN
  Administered 2018-03-24: 20 via INTRAVENOUS

## 2018-04-01 ENCOUNTER — Telehealth: Payer: Self-pay | Admitting: Gastroenterology

## 2018-04-01 MED ORDER — DEXLANSOPRAZOLE 60 MG PO CPDR: 60.0000 mg | DELAYED_RELEASE_CAPSULE | Freq: Every day | ORAL | 3 refills | Status: DC

## 2018-04-01 NOTE — Telephone Encounter (Signed)
I  am leaving #15 samples of Dexilant 60 mg at front for pt to pick up. Tobi Bastosnna, [facility] [place].

## 2018-04-01 NOTE — Addendum Note (Signed)
Addended by: Gelene MinkBOONE, Bindu Docter W on: 04/01/2018 11:25 AM   Modules accepted: Orders

## 2018-04-01 NOTE — Telephone Encounter (Signed)
Done

## 2018-04-01 NOTE — Telephone Encounter (Signed)
[phone] patient wants to know if we have samples of dexilant, her reflux is bad and those samples worked for her

## 2018-04-06 ENCOUNTER — Telehealth: Payer: Self-pay

## 2018-04-06 DIAGNOSIS — M545 Low back pain: Secondary | ICD-10-CM | POA: Diagnosis not present

## 2018-04-06 DIAGNOSIS — S32009G Unspecified fracture of unspecified lumbar vertebra, subsequent encounter for fracture with delayed healing: Secondary | ICD-10-CM | POA: Diagnosis not present

## 2018-04-06 NOTE — Telephone Encounter (Signed)
I have started a PA for Dexilant.  

## 2018-04-09 ENCOUNTER — Other Ambulatory Visit: Payer: Self-pay | Admitting: Orthopedic Surgery

## 2018-04-09 DIAGNOSIS — S32009G Unspecified fracture of unspecified lumbar vertebra, subsequent encounter for fracture with delayed healing: Secondary | ICD-10-CM

## 2018-04-10 ENCOUNTER — Ambulatory Visit
Admission: RE | Admit: 2018-04-10 | Discharge: 2018-04-10 | Disposition: A | Discharge status: Home / Self Care | Payer: Medicaid Other | Source: Ambulatory Visit | Attending: Orthopedic Surgery | Admitting: Orthopedic Surgery

## 2018-04-10 DIAGNOSIS — M545 Low back pain: Secondary | ICD-10-CM | POA: Diagnosis not present

## 2018-04-10 DIAGNOSIS — S32009G Unspecified fracture of unspecified lumbar vertebra, subsequent encounter for fracture with delayed healing: Secondary | ICD-10-CM

## 2018-04-21 ENCOUNTER — Encounter: Payer: Self-pay | Admitting: Family Medicine

## 2018-04-21 ENCOUNTER — Ambulatory Visit: Payer: Medicaid Other | Admitting: Family Medicine

## 2018-04-21 VITALS — BP 122/68 | Temp 98.3°F | Ht 66.0 in | Wt 152.0 lb

## 2018-04-21 DIAGNOSIS — J019 Acute sinusitis, unspecified: Secondary | ICD-10-CM | POA: Diagnosis not present

## 2018-04-21 MED ORDER — AMOXICILLIN 500 MG PO CAPS: 500.0000 mg | ORAL_CAPSULE | Freq: Three times a day (TID) | ORAL | 0 refills | Status: DC

## 2018-04-21 MED ORDER — BENZONATATE 100 MG PO CAPS: 100.0000 mg | ORAL_CAPSULE | Freq: Two times a day (BID) | ORAL | 0 refills | Status: DC | PRN

## 2018-04-21 NOTE — Progress Notes (Signed)
   Subjective:    Patient ID: Jeanne Reed, female    DOB: [birth year], 28 y.o.   MRN: [id number]  Cough  This is a new problem. The current episode started 1 to 4 weeks ago. Pertinent negatives include no ear pain, fever or sore throat.   Reports congestion x 1 week, cough productive in the mornings of brown mucous. Nasal discharge is green. Denies fever. No ear pain, but feels an "itch" deep in her ear. No sore throat.    Has been trying otc advil cold and sinus and flonase without help.   Review of Systems  Constitutional: Negative for fever.  HENT: Positive for congestion and sinus pressure. Negative for ear pain and sore throat.   Respiratory: Positive for cough.        Objective:   Physical Exam Vitals signs and nursing note reviewed.  Constitutional:      General: She is not in acute distress.    Appearance: Normal appearance. She is not toxic-appearing.  HENT:     Head: Normocephalic and atraumatic.     Right Ear: Tympanic membrane normal.     Left Ear: Tympanic membrane normal.     Nose: Congestion present.     Mouth/Throat:     Mouth: Mucous membranes are moist.     Pharynx: Oropharynx is clear.  Eyes:     General:        Right eye: No discharge.        Left eye: No discharge.  Neck:     Musculoskeletal: Neck supple. No neck rigidity.  Cardiovascular:     Rate and Rhythm: Normal rate and regular rhythm.     Heart sounds: Normal heart sounds.  Pulmonary:     Effort: Pulmonary effort is normal. No respiratory distress.     Breath sounds: Normal breath sounds.  Lymphadenopathy:     Cervical: No cervical adenopathy.  Skin:    General: Skin is warm and dry.  Neurological:     Mental Status: She is alert and oriented to person, place, and time.  Psychiatric:        Mood and Affect: Mood normal.           Assessment & Plan:  Acute rhinosinusitis  Discussed likely post-viral etiology. Will treat with antibiotics. Benzonatate prn for cough, cautioned  drowsiness potential. Symptomatic care discussed, warning signs discussed. F/u if symptoms worsen or fail to improve.

## 2018-04-21 NOTE — Patient Instructions (Signed)
Sinusitis, Adult  Sinusitis is inflammation of your sinuses. Sinuses are hollow spaces in the bones around your face. Your sinuses are located:   Around your eyes.   In the middle of your forehead.   Behind your nose.   In your cheekbones.  Mucus normally drains out of your sinuses. When your nasal tissues become inflamed or swollen, mucus can become trapped or blocked. This allows bacteria, viruses, and fungi to grow, which leads to infection. Most infections of the sinuses are caused by a virus.  Sinusitis can develop quickly. It can last for up to 4 weeks (acute) or for more than 12 weeks (chronic). Sinusitis often develops after a cold.  What are the causes?  This condition is caused by anything that creates swelling in the sinuses or stops mucus from draining. This includes:   Allergies.   Asthma.   Infection from bacteria or viruses.   Deformities or blockages in your nose or sinuses.   Abnormal growths in the nose (nasal polyps).   Pollutants, such as chemicals or irritants in the air.   Infection from fungi (rare).  What increases the risk?  You are more likely to develop this condition if you:   Have a weak body defense system (immune system).   Do a lot of swimming or diving.   Overuse nasal sprays.   Smoke.  What are the signs or symptoms?  The main symptoms of this condition are pain and a feeling of pressure around the affected sinuses. Other symptoms include:   Stuffy nose or congestion.   Thick drainage from your nose.   Swelling and warmth over the affected sinuses.   Headache.   Upper toothache.   A cough that may get worse at night.   Extra mucus that collects in the throat or the back of the nose (postnasal drip).   Decreased sense of smell and taste.   Fatigue.   A fever.   Sore throat.   Bad breath.  How is this diagnosed?  This condition is diagnosed based on:   Your symptoms.   Your medical history.   A physical exam.   Tests to find out if your condition is  acute or chronic. This may include:  ? Checking your nose for nasal polyps.  ? Viewing your sinuses using a device that has a light (endoscope).  ? Testing for allergies or bacteria.  ? Imaging tests, such as an MRI or CT scan.  In rare cases, a bone biopsy may be done to rule out more serious types of fungal sinus disease.  How is this treated?  Treatment for sinusitis depends on the cause and whether your condition is chronic or acute.   If caused by a virus, your symptoms should go away on their own within 10 days. You may be given medicines to relieve symptoms. They include:  ? Medicines that shrink swollen nasal passages (topical intranasal decongestants).  ? Medicines that treat allergies (antihistamines).  ? A spray that eases inflammation of the nostrils (topical intranasal corticosteroids).  ? Rinses that help get rid of thick mucus in your nose (nasal saline washes).   If caused by bacteria, your health care provider may recommend waiting to see if your symptoms improve. Most bacterial infections will get better without antibiotic medicine. You may be given antibiotics if you have:  ? A severe infection.  ? A weak immune system.   If caused by narrow nasal passages or nasal polyps, you may need   to have surgery.  Follow these instructions at home:  Medicines   Take, use, or apply over-the-counter and prescription medicines only as told by your health care provider. These may include nasal sprays.   If you were prescribed an antibiotic medicine, take it as told by your health care provider. Do not stop taking the antibiotic even if you start to feel better.  Hydrate and humidify     Drink enough fluid to keep your urine pale yellow. Staying hydrated will help to thin your mucus.   Use a cool mist humidifier to keep the humidity level in your home above 50%.   Inhale steam for 10-15 minutes, 3-4 times a day, or as told by your health care provider. You can do this in the bathroom while a hot shower is  running.   Limit your exposure to cool or dry air.  Rest   Rest as much as possible.   Sleep with your head raised (elevated).   Make sure you get enough sleep each night.  General instructions     Apply a warm, moist washcloth to your face 3-4 times a day or as told by your health care provider. This will help with discomfort.   Wash your hands often with soap and water to reduce your exposure to germs. If soap and water are not available, use hand sanitizer.   Do not smoke. Avoid being around people who are smoking (secondhand smoke).   Keep all follow-up visits as told by your health care provider. This is important.  Contact a health care provider if:   You have a fever.   Your symptoms get worse.   Your symptoms do not improve within 10 days.  Get help right away if:   You have a severe headache.   You have persistent vomiting.   You have severe pain or swelling around your face or eyes.   You have vision problems.   You develop confusion.   Your neck is stiff.   You have trouble breathing.  Summary   Sinusitis is soreness and inflammation of your sinuses. Sinuses are hollow spaces in the bones around your face.   This condition is caused by nasal tissues that become inflamed or swollen. The swelling traps or blocks the flow of mucus. This allows bacteria, viruses, and fungi to grow, which leads to infection.   If you were prescribed an antibiotic medicine, take it as told by your health care provider. Do not stop taking the antibiotic even if you start to feel better.   Keep all follow-up visits as told by your health care provider. This is important.  This information is not intended to replace advice given to you by your health care provider. Make sure you discuss any questions you have with your health care provider.  Document Released: 04/01/2005 Document Revised: 09/01/2017 Document Reviewed: 09/01/2017  Elsevier Interactive Patient Education  2019 [employer]

## 2018-04-28 ENCOUNTER — Other Ambulatory Visit: Payer: Self-pay

## 2018-04-28 ENCOUNTER — Ambulatory Visit (INDEPENDENT_AMBULATORY_CARE_PROVIDER_SITE_OTHER): Payer: Medicaid Other | Admitting: Gastroenterology

## 2018-04-28 ENCOUNTER — Encounter: Payer: Self-pay | Admitting: Gastroenterology

## 2018-04-28 VITALS — BP 106/74 | HR 93 | Temp 97.0°F | Ht 66.0 in | Wt 150.4 lb

## 2018-04-28 DIAGNOSIS — R1310 Dysphagia, unspecified: Secondary | ICD-10-CM

## 2018-04-28 DIAGNOSIS — K219 Gastro-esophageal reflux disease without esophagitis: Secondary | ICD-10-CM | POA: Diagnosis not present

## 2018-04-28 MED ORDER — DEXLANSOPRAZOLE 60 MG PO CPDR: 60.0000 mg | DELAYED_RELEASE_CAPSULE | Freq: Every day | ORAL | 3 refills | Status: DC

## 2018-04-28 NOTE — Patient Instructions (Addendum)
I have provided samples of Dexilant and also sent to your pharmacy. You can take Pepcid as needed in addition to this.  We are arranging an upper endoscopy with dilation by Dr. Darrick Penna in the near future!  We will see you back after the procedures.   I have attached a handout on reflux management.   Have a good New Year!  I enjoyed seeing you again today! As you know, I value our relationship and want to provide genuine, compassionate, and quality care. I welcome your feedback. If you receive a survey regarding your visit,  I greatly appreciate you taking time to fill this out. See you next time!  Gelene Mink, PhD, ANP-BC [facility]   Gastroesophageal Reflux Disease, Adult Gastroesophageal reflux (GER) happens when acid from the stomach flows up into the tube that connects the mouth and the stomach (esophagus). Normally, food travels down the esophagus and stays in the stomach to be digested. However, when a person has GER, food and stomach acid sometimes move back up into the esophagus. If this becomes a more serious problem, the person may be diagnosed with a disease called gastroesophageal reflux disease (GERD). GERD occurs when the reflux:  Happens often.  Causes frequent or severe symptoms.  Causes problems such as damage to the esophagus. When stomach acid comes in contact with the esophagus, the acid may cause soreness (inflammation) in the esophagus. Over time, GERD may create small holes (ulcers) in the lining of the esophagus. What are the causes? This condition is caused by a problem with the muscle between the esophagus and the stomach (lower esophageal sphincter, or LES). Normally, the LES muscle closes after food passes through the esophagus to the stomach. When the LES is weakened or abnormal, it does not close properly, and that allows food and stomach acid to go back up into the esophagus. The LES can be weakened by certain dietary substances, medicines,  and medical conditions, including:  Tobacco use.  Pregnancy.  Having a hiatal hernia.  Alcohol use.  Certain foods and beverages, such as coffee, chocolate, onions, and peppermint. What increases the risk? You are more likely to develop this condition if you:  Have an increased body weight.  Have a connective tissue disorder.  Use NSAID medicines. What are the signs or symptoms? Symptoms of this condition include:  Heartburn.  Difficult or painful swallowing.  The feeling of having a lump in the throat.  Abitter taste in the mouth.  Bad breath.  Having a large amount of saliva.  Having an upset or bloated stomach.  Belching.  Chest pain. Different conditions can cause chest pain. Make sure you see your health care provider if you experience chest pain.  Shortness of breath or wheezing.  Ongoing (chronic) cough or a night-time cough.  Wearing away of tooth enamel.  Weight loss. How is this diagnosed? Your health care provider will take a medical history and perform a physical exam. To determine if you have mild or severe GERD, your health care provider may also monitor how you respond to treatment. You may also have tests, including:  A test to examine your stomach and esophagus with a small camera (endoscopy).  A test thatmeasures the acidity level in your esophagus.  A test thatmeasures how much pressure is on your esophagus.  A barium swallow or modified barium swallow test to show the shape, size, and functioning of your esophagus. How is this treated? The goal of treatment is to help relieve  your symptoms and to prevent complications. Treatment for this condition may vary depending on how severe your symptoms are. Your health care provider may recommend:  Changes to your diet.  Medicine.  Surgery. Follow these instructions at home: Eating and drinking   Follow a diet as recommended by your health care provider. This may involve avoiding  foods and drinks such as: ? Coffee and tea (with or without caffeine). ? Drinks that containalcohol. ? Energy drinks and sports drinks. ? Carbonated drinks or sodas. ? Chocolate and cocoa. ? Peppermint and mint flavorings. ? Garlic and onions. ? Horseradish. ? Spicy and acidic foods, including peppers, chili powder, curry powder, vinegar, hot sauces, and barbecue sauce. ? Citrus fruit juices and citrus fruits, such as oranges, lemons, and limes. ? Tomato-based foods, such as red sauce, chili, salsa, and pizza with red sauce. ? Fried and fatty foods, such as donuts, french fries, potato chips, and high-fat dressings. ? High-fat meats, such as hot dogs and fatty cuts of red and white meats, such as rib eye steak, sausage, ham, and bacon. ? High-fat dairy items, such as whole milk, butter, and cream cheese.  Eat small, frequent meals instead of large meals.  Avoid drinking large amounts of liquid with your meals.  Avoid eating meals during the 2-3 hours before bedtime.  Avoid lying down right after you eat.  Do not exercise right after you eat. Lifestyle   Do not use any products that contain nicotine or tobacco, such as cigarettes, e-cigarettes, and chewing tobacco. If you need help quitting, ask your health care provider.  Try to reduce your stress by using methods such as yoga or meditation. If you need help reducing stress, ask your health care provider.  If you are overweight, reduce your weight to an amount that is healthy for you. Ask your health care provider for guidance about a safe weight loss goal. General instructions  Pay attention to any changes in your symptoms.  Take over-the-counter and prescription medicines only as told by your health care provider. Do not take aspirin, ibuprofen, or other NSAIDs unless your health care provider told you to do so.  Wear loose-fitting clothing. Do not wear anything tight around your waist that causes pressure on your  abdomen.  Raise (elevate) the head of your bed about 6 inches (15 cm).  Avoid bending over if this makes your symptoms worse.  Keep all follow-up visits as told by your health care provider. This is important. Contact a health care provider if:  You have: ? New symptoms. ? Unexplained weight loss. ? Difficulty swallowing or it hurts to swallow. ? Wheezing or a persistent cough. ? A hoarse voice.  Your symptoms do not improve with treatment. Get help right away if you:  Have pain in your arms, neck, jaw, teeth, or back.  Feel sweaty, dizzy, or light-headed.  Have chest pain or shortness of breath.  Vomit and your vomit looks like blood or coffee grounds.  Faint.  Have stool that is bloody or black.  Cannot swallow, drink, or eat. Summary  Gastroesophageal reflux happens when acid from the stomach flows up into the esophagus. GERD is a disease in which the reflux happens often, causes frequent or severe symptoms, or causes problems such as damage to the esophagus.  Treatment for this condition may vary depending on how severe your symptoms are. Your health care provider may recommend diet and lifestyle changes, medicine, or surgery.  Contact a health care provider if you  have new or worsening symptoms.  Take over-the-counter and prescription medicines only as told by your health care provider. Do not take aspirin, ibuprofen, or other NSAIDs unless your health care provider told you to do so.  Keep all follow-up visits as told by your health care provider. This is important. This information is not intended to replace advice given to you by your health care provider. Make sure you discuss any questions you have with your health care provider. Document Released: 01/09/2005 Document Revised: 10/08/2017 Document Reviewed: 10/08/2017 Elsevier Interactive Patient Education  2019 [employer]

## 2018-04-28 NOTE — Patient Instructions (Signed)
Order entered for pregnancy test to be done at pre-op appt prior to EGD/DIL. LMOVM and informed endo scheduler.

## 2018-04-28 NOTE — Progress Notes (Addendum)
REVIEWED. EGD/DIL FOR DYSPEPSIA/DYSPHAGIA.  Referring Provider: Babs Sciara, MD Primary Care Physician:  Babs Sciara, MD  Primary GI: Dr. Darrick Penna   Chief Complaint  Patient presents with  . Gastroesophageal Reflux    HPI:   Jeanne Reed is a 28 y.o. female presenting today with a history of chronic GERD, intermittent dysphagia. She was to have an EGD/dilation last year but had to be postponed.    Dexilant worked best in the past. Still with intermittent solid food dysphagia. Eats late at night. No appetite. Notes nausea almost every morning. Dexilant helped a lot in the mornings. Needs prescription for Dexilant.   When eating green veggies, causes significant nausea and vomiting. Usually will be sauteed or baked.   Past Medical History:  Diagnosis Date  . GERD (gastroesophageal reflux disease)   . Nausea and vomiting during pregnancy 02/08/2015    Past Surgical History:  Procedure Laterality Date  . TONSILLECTOMY    . WISDOM TOOTH EXTRACTION      Current Outpatient Medications  Medication Sig Dispense Refill  . benzonatate (TESSALON) 100 MG capsule Take 1 capsule (100 mg total) by mouth 2 (two) times daily as needed for cough. (Patient not taking: Reported on 04/29/2018) 20 capsule 0  . famotidine (PEPCID) 40 MG tablet Take 1 tablet (40 mg total) by mouth daily. (Patient not taking: Reported on 04/29/2018) 30 tablet 5  . ibuprofen (ADVIL,MOTRIN) 800 MG tablet Take 800 mg by mouth every 8 (eight) hours as needed for headache or moderate pain.     . Norgestimate-Ethinyl Estradiol Triphasic (ORTHO TRI-CYCLEN LO) 0.18/0.215/0.25 MG-25 MCG tab Take 1 tablet by mouth daily. 1 Package 5  . traMADol (ULTRAM) 50 MG tablet Take 50 mg by mouth daily as needed for moderate pain.   0  . valACYclovir (VALTREX) 1000 MG tablet Take 1 tablet (1,000 mg total) by mouth 2 (two) times daily. (Patient taking differently: Take 1,000 mg by mouth daily. ) 60 tablet 11  . dexlansoprazole  (DEXILANT) 60 MG capsule Take 1 capsule (60 mg total) by mouth daily. 90 capsule 3   No current facility-administered medications for this visit.     Allergies as of 04/28/2018  . (No Known Allergies)    Family History  Problem Relation Age of Onset  . Cancer Maternal Grandfather   . Diabetes Maternal Grandfather   . Cancer Paternal Grandmother   . Colon cancer Paternal Grandmother   . Emphysema Paternal Grandfather   . Colon polyps Neg Hx     Social History   Socioeconomic History  . Marital status: Single    Spouse name: Not on file  . Number of children: 1  . Years of education: Not on file  . Highest education level: Not on file  Occupational History  . Occupation: [employer]  Social Needs  . Financial resource strain: Not on file  . Food insecurity:    Worry: Not on file    Inability: Not on file  . Transportation needs:    Medical: Not on file    Non-medical: Not on file  Tobacco Use  . Smoking status: Never Smoker  . Smokeless tobacco: Never Used  Substance and Sexual Activity  . Alcohol use: No  . 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 Sexual activity: Yes  Lifestyle  . Physical activity:    Days per week: Not on file    Minutes per session: Not  on file  . Stress: Not on file  Relationships  . Social connections:    Talks on phone: Not on file    Gets together: Not on file    Attends religious service: Not on file    Active member of club or organization: Not on file    Attends meetings of clubs or organizations: Not on file    Relationship status: Not on file  Other Topics Concern  . Not on file  Social History Narrative   [age]-year-old girl, Callie.     Review of Systems: Gen: Denies fever, chills, anorexia. Denies fatigue, weakness, weight loss.  CV: Denies chest pain, palpitations, syncope, peripheral edema, and claudication. Resp: Denies dyspnea at rest, cough, wheezing, coughing up blood, and  pleurisy. GI: see HPI  Derm: Denies rash, itching, dry skin Psych: Denies depression, anxiety, memory loss, confusion. No homicidal or suicidal ideation.  Heme: Denies bruising, bleeding, and enlarged lymph nodes.  Physical Exam: BP 106/74   Pulse 93   Temp (!) 97 F (36.1 C) (Oral)   Ht 5\' 6"  (1.676 m)   Wt 150 lb 6.4 oz (68.2 kg)   LMP 04/07/2018 (Approximate)   BMI 24.28 kg/m  General:   Alert and oriented. No distress noted. Pleasant and cooperative.  Head:  Normocephalic and atraumatic. Eyes:  Conjuctiva clear without scleral icterus. Mouth:  Oral mucosa pink and moist. Good dentition. No lesions. Lungs: clear to auscultation bilaterally Cardiac: S1 S2 present without murmurs  Abdomen:  +BS, soft, non-tender and non-distended. No rebound or guarding. No HSM or masses noted. Msk:  Symmetrical without gross deformities. Normal posture. Extremities:  Without edema. Neurologic:  Alert and  oriented x4 Psych:  Alert and cooperative. Normal mood and affect.

## 2018-05-01 ENCOUNTER — Encounter (HOSPITAL_COMMUNITY)
Admission: RE | Admit: 2018-05-01 | Discharge: 2018-05-01 | Disposition: A | Discharge status: Home / Self Care | Payer: Medicaid Other | Source: Ambulatory Visit | Attending: Gastroenterology | Admitting: Gastroenterology

## 2018-05-01 ENCOUNTER — Encounter (HOSPITAL_COMMUNITY): Payer: Self-pay

## 2018-05-01 DIAGNOSIS — Z01812 Encounter for preprocedural laboratory examination: Secondary | ICD-10-CM | POA: Insufficient documentation

## 2018-05-01 LAB — HCG, QUANTITATIVE, PREGNANCY: hCG, Beta Chain, Quant, S: <1 m[IU]/mL (ref <5)

## 2018-05-03 NOTE — Assessment & Plan Note (Signed)
28 year old female with chronic GERD since teenager, failing Protonix, Prilosec, Nexium, and H2 blockers. Dexilant provided at last visit with improvement. Continues with solid food dysphagia. Chronic nausea likely due to uncontrolled GERD, unable to exclude delayed gastric emptying.   Proceed with upper endoscopy/dilation in the near future with Dr. Darrick Penna. The risks, benefits, and alternatives have been discussed in detail with patient. They have stated understanding and desire to proceed.  Propofol due to polypharmacy Pregnancy screen prior Dexilant samples and prescription provided

## 2018-05-03 NOTE — Assessment & Plan Note (Signed)
Likely due to uncontrolled GERD. EGD/Dilatation scheduled.

## 2018-05-04 NOTE — Progress Notes (Signed)
CC'D TO PCP °

## 2018-05-05 ENCOUNTER — Ambulatory Visit (HOSPITAL_COMMUNITY): Payer: Medicaid Other | Admitting: Anesthesiology

## 2018-05-05 ENCOUNTER — Encounter (HOSPITAL_COMMUNITY)
Admission: RE | Disposition: A | Discharge status: Home / Self Care | Payer: Self-pay | Source: Ambulatory Visit | Attending: Gastroenterology

## 2018-05-05 ENCOUNTER — Ambulatory Visit (HOSPITAL_COMMUNITY)
Admission: RE | Admit: 2018-05-05 | Discharge: 2018-05-05 | Disposition: A | Discharge status: Home / Self Care | Payer: Medicaid Other | Source: Ambulatory Visit | Attending: Gastroenterology | Admitting: Gastroenterology

## 2018-05-05 ENCOUNTER — Encounter (HOSPITAL_COMMUNITY): Payer: Self-pay | Admitting: *Deleted

## 2018-05-05 DIAGNOSIS — Z833 Family history of diabetes mellitus: Secondary | ICD-10-CM | POA: Diagnosis not present

## 2018-05-05 DIAGNOSIS — Z791 Long term (current) use of non-steroidal anti-inflammatories (NSAID): Secondary | ICD-10-CM | POA: Diagnosis not present

## 2018-05-05 DIAGNOSIS — Z79899 Other long term (current) drug therapy: Secondary | ICD-10-CM | POA: Diagnosis not present

## 2018-05-05 DIAGNOSIS — R1310 Dysphagia, unspecified: Secondary | ICD-10-CM | POA: Diagnosis not present

## 2018-05-05 DIAGNOSIS — K219 Gastro-esophageal reflux disease without esophagitis: Secondary | ICD-10-CM | POA: Diagnosis not present

## 2018-05-05 DIAGNOSIS — K449 Diaphragmatic hernia without obstruction or gangrene: Secondary | ICD-10-CM | POA: Diagnosis not present

## 2018-05-05 DIAGNOSIS — Z809 Family history of malignant neoplasm, unspecified: Secondary | ICD-10-CM | POA: Diagnosis not present

## 2018-05-05 DIAGNOSIS — Z825 Family history of asthma and other chronic lower respiratory diseases: Secondary | ICD-10-CM | POA: Insufficient documentation

## 2018-05-05 DIAGNOSIS — R1013 Epigastric pain: Secondary | ICD-10-CM | POA: Diagnosis not present

## 2018-05-05 DIAGNOSIS — Z800 Family history of malignant neoplasm of digestive organs: Secondary | ICD-10-CM | POA: Insufficient documentation

## 2018-05-05 DIAGNOSIS — K2970 Gastritis, unspecified, without bleeding: Secondary | ICD-10-CM | POA: Diagnosis not present

## 2018-05-05 DIAGNOSIS — K2950 Unspecified chronic gastritis without bleeding: Secondary | ICD-10-CM | POA: Diagnosis not present

## 2018-05-05 HISTORY — PX: SAVORY DILATION: SHX5439

## 2018-05-05 HISTORY — PX: ESOPHAGOGASTRODUODENOSCOPY (EGD) WITH PROPOFOL: SHX5813

## 2018-05-05 HISTORY — PX: BIOPSY: SHX5522

## 2018-05-05 SURGERY — ESOPHAGOGASTRODUODENOSCOPY (EGD) WITH PROPOFOL
Anesthesia: Monitor Anesthesia Care

## 2018-05-05 MED ORDER — LIDOCAINE VISCOUS HCL 2 % MT SOLN
10.0000 mL | Freq: Once | OROMUCOSAL | Status: AC
  Administered 2018-05-05: 6 mL via OROMUCOSAL

## 2018-05-05 MED ORDER — LACTATED RINGERS IV SOLN
INTRAVENOUS | Status: DC
  Administered 2018-05-05: 07:00:00 via INTRAVENOUS

## 2018-05-05 MED ORDER — PROPOFOL 10 MG/ML IV BOLUS
INTRAVENOUS | Status: DC | PRN
  Administered 2018-05-05: 40 mg via INTRAVENOUS
  Administered 2018-05-05 (×2): 20 mg via INTRAVENOUS
  Administered 2018-05-05 (×2): 40 mg via INTRAVENOUS

## 2018-05-05 MED ORDER — CHLORHEXIDINE GLUCONATE CLOTH 2 % EX PADS: 6.0000 | MEDICATED_PAD | Freq: Once | CUTANEOUS | Status: DC

## 2018-05-05 MED ORDER — LIDOCAINE VISCOUS HCL 2 % MT SOLN
OROMUCOSAL | Status: DC | PRN
  Administered 2018-05-05: 6 mL via OROMUCOSAL

## 2018-05-05 MED ORDER — LIDOCAINE VISCOUS HCL 2 % MT SOLN
OROMUCOSAL | Status: AC
  Filled 2018-05-05: qty 15

## 2018-05-05 MED ORDER — PROPOFOL 500 MG/50ML IV EMUL
INTRAVENOUS | Status: DC | PRN
  Administered 2018-05-05: 200 ug/kg/min via INTRAVENOUS

## 2018-05-05 NOTE — Anesthesia Preprocedure Evaluation (Signed)
Anesthesia Evaluation  Patient identified by MRN, date of birth, ID band Patient awake    Reviewed: Allergy & Precautions, H&P , NPO status , Patient's Chart, lab work & pertinent test results  Airway Mallampati: II  TM Distance: >3 FB Neck ROM: full    Dental no notable dental hx.    Pulmonary neg pulmonary ROS,    Pulmonary exam normal breath sounds clear to auscultation       Cardiovascular Exercise Tolerance: Good negative cardio ROS   Rhythm:regular Rate:Normal     Neuro/Psych negative neurological ROS  negative psych ROS   GI/Hepatic Neg liver ROS, GERD  ,  Endo/Other  negative endocrine ROS  Renal/GU negative Renal ROS  negative genitourinary   Musculoskeletal   Abdominal   Peds  Hematology negative hematology ROS (+)   Anesthesia Other Findings   Reproductive/Obstetrics negative OB ROS                             Anesthesia Physical Anesthesia Plan  ASA: II  Anesthesia Plan: MAC   Post-op Pain Management:    Induction:   PONV Risk Score and Plan:   Airway Management Planned:   Additional Equipment:   Intra-op Plan:   Post-operative Plan:   Informed Consent: I have reviewed the patients History and Physical, chart, labs and discussed the procedure including the risks, benefits and alternatives for the proposed anesthesia with the patient or authorized representative who has indicated his/her understanding and acceptance.     Dental Advisory Given  Plan Discussed with: CRNA  Anesthesia Plan Comments:         Anesthesia Quick Evaluation

## 2018-05-05 NOTE — Transfer of Care (Signed)
Immediate Anesthesia Transfer of Care Note  Patient: Jeanne Reed  Procedure(s) Performed: ESOPHAGOGASTRODUODENOSCOPY (EGD) WITH PROPOFOL (N/A ) SAVORY DILATION (N/A ) BIOPSY  Patient Location: PACU  Anesthesia Type:MAC  Level of Consciousness: awake, alert  and patient cooperative  Airway & Oxygen Therapy: Patient Spontanous Breathing  Post-op Assessment: Report given to RN and Post -op Vital signs reviewed and stable  Post vital signs: Reviewed and stable  Last Vitals:  Vitals Value Taken Time  BP    Temp    Pulse 49 05/05/2018  8:02 AM  Resp    SpO2 78 % 05/05/2018  8:02 AM  Vitals shown include unvalidated device data.  Last Pain:  Vitals:   05/05/18 0731  TempSrc:   PainSc: 0-No pain      Patients Stated Pain Goal: 5 (49/70/26 [id number])  Complications: No apparent anesthesia complications

## 2018-05-05 NOTE — Anesthesia Procedure Notes (Signed)
Procedure Name: MAC Date/Time: 05/05/2018 7:29 AM Performed by: Vista Deck, CRNA Pre-anesthesia Checklist: Patient identified, Emergency Drugs available, Suction available, Timeout performed and Patient being monitored Patient Re-evaluated:Patient Re-evaluated prior to induction Oxygen Delivery Method: Nasal Cannula

## 2018-05-05 NOTE — Anesthesia Postprocedure Evaluation (Signed)
Anesthesia Post Note  Patient: Jeanne Reed  Procedure(s) Performed: ESOPHAGOGASTRODUODENOSCOPY (EGD) WITH PROPOFOL (N/A ) SAVORY DILATION (N/A ) BIOPSY  Patient location during evaluation: PACU Anesthesia Type: MAC Level of consciousness: awake and alert and patient cooperative Pain management: satisfactory to patient Vital Signs Assessment: post-procedure vital signs reviewed and stable Respiratory status: spontaneous breathing Cardiovascular status: stable Postop Assessment: no apparent nausea or vomiting Anesthetic complications: no     Last Vitals:  Vitals:   05/05/18 0633 05/05/18 0800  BP: 107/70 92/64  Pulse: 83 82  Resp: 18 12  Temp: 36.8 C 36.7 C  SpO2: 98% 99%    Last Pain:  Vitals:   05/05/18 0800  TempSrc:   PainSc: 0-No pain                 Talyssa Gibas

## 2018-05-05 NOTE — Op Note (Signed)
[facility] Patient Name: Jeanne Reed Procedure Date: 05/05/2018 7:15 AM MRN: [id number] Date of Birth: July 24, 1990 Attending MD: Jonette EvaSandi Tayten Bergdoll MD, MD CSN: [id number] Age: [age] Admit Type: Outpatient Procedure:                Upper GI endoscopy WITH COLD FORCEPS                            BIOPSY/ESOPHAGEAL DILATION Indications:              Epigastric abdominal pain, Dyspepsia, Dysphagia Providers:                Jonette EvaSandi Romey Mathieson MD, MD, Loma MessingLurae B. Patsy LagerAlbert RN, RN,                            Sterling Bigiffani Roberts, RN, Burke Keelsrisann Tilley, Technician Referring MD:             Jonna CoupScott A. Luking Medicines:                Propofol per Anesthesia Complications:            No immediate complications. Estimated Blood Loss:     Estimated blood loss was minimal. Procedure:                Pre-Anesthesia Assessment:                           - Prior to the procedure, a History and Physical                            was performed, and patient medications and                            allergies were reviewed. The patient's tolerance of                            previous anesthesia was also reviewed. The risks                            and benefits of the procedure and the sedation                            options and risks were discussed with the patient.                            All questions were answered, and informed consent                            was obtained. Prior Anticoagulants: The patient has                            taken ibuprofen, last dose was 7 days prior to                            procedure. ASA Grade Assessment: II - A patient  with mild systemic disease. After reviewing the                            risks and benefits, the patient was deemed in                            satisfactory condition to undergo the procedure.                            After obtaining informed consent, the endoscope was                            passed under direct vision.  Throughout the                            procedure, the patient's blood pressure, pulse, and                            oxygen saturations were monitored continuously. The                            upper GI endoscopy was accomplished without                            difficulty. The patient tolerated the procedure                            well. The GIF-H190 ([id number]) scope was introduced                            through the mouth, and advanced to the second part                            of duodenum. Scope In: 7:42:24 AM Scope Out: 7:53:15 AM Total Procedure Duration: 0 hours 10 minutes 51 seconds  Findings:      No endoscopic abnormality was evident in the esophagus to explain the       patient's complaint of dysphagia. It was decided, however, to proceed       with dilation of the entire esophagus. A guidewire was placed and the       scope was withdrawn. Dilation was performed with a Savary dilator with       mild resistance at 16 mm and 17 mm. Biopsies were obtained from the       proximal and distal esophagus with cold forceps for histology of       suspected eosinophilic esophagitis.      Diffuse mild inflammation characterized by congestion (edema) and       erythema was found in the gastric antrum. Biopsies were taken with a       cold forceps for histology.      The examined duodenum was normal.      A small hiatal hernia was present. Impression:               - DYSPHAGIA MOST LIKELY DUE TO UNCONTROLLED GERD.                           -  MILD Gastritis DUE TO IBUPROFEN Moderate Sedation:      Per Anesthesia Care Recommendation:           - Patient has a contact number available for                            emergencies. The signs and symptoms of potential                            delayed complications were discussed with the                            patient. Return to normal activities tomorrow.                            Written discharge instructions were  provided to the                            patient.                           - Low fat diet.                           - Continue present medications.                           - Await pathology results.                           - Return to my office in 4 months. Procedure Code(s):        --- Professional ---                           [phone], Esophagogastroduodenoscopy, flexible,                            transoral; with insertion of guide wire followed by                            passage of dilator(s) through esophagus over guide                            wire                           [id number], 59, Esophagogastroduodenoscopy, flexible,                            transoral; with biopsy, single or multiple Diagnosis Code(s):        --- Professional ---                           R13.10, Dysphagia, unspecified                           K29.70, Gastritis, unspecified, without bleeding  R10.13, Epigastric pain CPT copyright 2018 [employer]. All rights reserved. The codes documented in this report are preliminary and upon coder review may  be revised to meet current compliance requirements. Jonette Eva, MD Jonette Eva MD, MD 05/05/2018 8:18:03 AM This report has been signed electronically. Number of Addenda: 0

## 2018-05-05 NOTE — Discharge Instructions (Signed)
I stretched your esophagus. You have a small hiatal hernia. You have mild gastritis due to ibuprofen. I biopsied your esophagus and stomach.  To better control REFLUX:   1. DRINK WATER TO KEEP YOUR URINE LIGHT YELLOW.    2. AVOID REFLUX TRIGGERS. SEE INFO BELOW.    3. FOLLOW A LOW FAT DIET. MEATS SHOULD BE BAKED, BROILED, OR BOILED. Avoid fried foods. SEE INFO BELOW.    4. CONTINUE DEXILANT.  PEPCID HELPS MOST WHEN USED AS NEEDED TO CONTROL HEARTBURN.    YOUR BIOPSY RESULTS WILL BE BACK IN 5 BUSINESS DAYS.  FOLLOW UP IN 4 MOS.  UPPER ENDOSCOPY AFTER CARE Read the instructions outlined below and refer to this sheet in the next week. These discharge instructions provide you with general information on caring for yourself after you leave the hospital. While your treatment has been planned according to the most current medical practices available, unavoidable complications occasionally occur. If you have any problems or questions after discharge, call DR. Abir Craine, [phone].  ACTIVITY  You may resume your regular activity, but move at a slower pace for the next 24 hours.   Take frequent rest periods for the next 24 hours.   Walking will help get rid of the air and reduce the bloated feeling in your belly (abdomen).   No driving for 24 hours (because of the medicine (anesthesia) used during the test).   You may shower.   Do not sign any important legal documents or operate any machinery for 24 hours (because of the anesthesia used during the test).    NUTRITION  Drink plenty of fluids.   You may resume your normal diet as instructed by your doctor.   Begin with a light meal and progress to your normal diet. Heavy or fried foods are harder to digest and may make you feel sick to your stomach (nauseated).   Avoid alcoholic beverages for 24 hours or as instructed.    MEDICATIONS  You may resume your normal medications.   WHAT YOU CAN EXPECT TODAY  Some feelings of  bloating in the abdomen.   Passage of more gas than usual.    IF YOU HAD A BIOPSY TAKEN DURING THE UPPER ENDOSCOPY:  Eat a soft diet IF YOU HAVE NAUSEA, BLOATING, ABDOMINAL PAIN, OR VOMITING.    FINDING OUT THE RESULTS OF YOUR TEST Not all test results are available during your visit. DR. Darrick Penna WILL CALL YOU WITHIN 7 DAYS OF YOUR PROCEDUE WITH YOUR RESULTS. Do not assume everything is normal if you have not heard from DR. Annaleese Guier IN ONE WEEK, CALL HER OFFICE AT [phone].  SEEK IMMEDIATE MEDICAL ATTENTION AND CALL THE OFFICE: [phone] IF:  You have more than a spotting of blood in your stool.   Your belly is swollen (abdominal distention).   You are nauseated or vomiting.   You have a temperature over 101F.   You have abdominal pain or discomfort that is severe or gets worse throughout the day.  Lifestyle and home remedies TO CONTROL HEARTBURN You may eliminate or reduce the frequency of heartburn by making the following lifestyle changes:   Control your weight. Being overweight is a major risk factor for heartburn and GERD. Excess pounds put pressure on your abdomen, pushing up your stomach and causing acid to back up into your esophagus.    Eat smaller meals. 4 TO 6 MEALS A DAY. This reduces pressure on the lower esophageal sphincter, helping to prevent the valve from opening and  acid from washing back into your esophagus.    Loosen your belt. Clothes that fit tightly around your waist put pressure on your abdomen and the lower esophageal sphincter.    Eliminate heartburn triggers. Everyone has specific triggers. Common triggers such as fatty or fried foods, spicy food, tomato sauce, carbonated beverages, alcohol, chocolate, mint, garlic, onion, caffeine and nicotine may make heartburn worse.    Avoid stooping or bending. Tying your shoes is OK. Bending over for longer periods to weed your garden isn't, especially soon after eating.    Don't lie down after a meal.  Wait at least three to four hours after eating before going to bed, and don't lie down right after eating.   Alternative medicine  Several home remedies exist for treating GERD, but they provide only temporary relief. They include drinking baking soda (sodium bicarbonate) added to water or drinking other fluids such as baking soda mixed with cream of tartar and water.  Although these liquids create temporary relief by neutralizing, washing away or buffering acids, eventually they aggravate the situation by adding gas and fluid to your stomach, increasing pressure and causing more acid reflux. Further, adding more sodium to your diet may increase your blood pressure and add stress to your heart, and excessive bicarbonate ingestion can alter the acid-base balance in your body.  Low-Fat Diet BREADS, CEREALS, PASTA, RICE, DRIED PEAS, AND BEANS These products are high in carbohydrates and most are low in fat. Therefore, they can be increased in the diet as substitutes for fatty foods. They too, however, contain calories and should not be eaten in excess. Cereals can be eaten for snacks as well as for breakfast.   FRUITS AND VEGETABLES It is good to eat fruits and vegetables. Besides being sources of fiber, both are rich in vitamins and some minerals. They help you get the daily allowances of these nutrients. Fruits and vegetables can be used for snacks and desserts.  MEATS Limit lean meat, chicken, [place], and fish to no more than 6 ounces per day. Beef, Pork, and Lamb Use lean cuts of beef, pork, and lamb. Lean cuts include:  Extra-lean ground beef.  Arm roast.  Sirloin tip.  Center-cut ham.  Round steak.  Loin chops.  Rump roast.  Tenderloin.  Trim all fat off the outside of meats before cooking. It is not necessary to severely decrease the intake of red meat, but lean choices should be made. Lean meat is rich in protein and contains a highly absorbable form of iron. Premenopausal women, in  particular, should avoid reducing lean red meat because this could increase the risk for low red blood cells (iron-deficiency anemia).  Chicken and [place] These are good sources of protein. The fat of poultry can be reduced by removing the skin and underlying fat layers before cooking. Chicken and [place] can be substituted for lean red meat in the diet. Poultry should not be fried or covered with high-fat sauces. Fish and Shellfish Fish is a good source of protein. Shellfish contain cholesterol, but they usually are low in saturated fatty acids. The preparation of fish is important. Like chicken and [place], they should not be fried or covered with high-fat sauces. EGGS Egg whites contain no fat or cholesterol. They can be eaten often. Try 1 to 2 egg whites instead of whole eggs in recipes or use egg substitutes that do not contain yolk. MILK AND DAIRY PRODUCTS Use skim or 1% milk instead of 2% or whole milk. Decrease whole  milk, natural, and processed cheeses. Use nonfat or low-fat (2%) cottage cheese or low-fat cheeses made from vegetable oils. Choose nonfat or low-fat (1 to 2%) yogurt. Experiment with evaporated skim milk in recipes that call for heavy cream. Substitute low-fat yogurt or low-fat cottage cheese for sour cream in dips and salad dressings. Have at least 2 servings of low-fat dairy products, such as 2 glasses of skim (or 1%) milk each day to help get your daily calcium intake. FATS AND OILS Reduce the total intake of fats, especially saturated fat. Butterfat, lard, and beef fats are high in saturated fat and cholesterol. These should be avoided as much as possible. Vegetable fats do not contain cholesterol, but certain vegetable fats, such as coconut oil, palm oil, and palm kernel oil are very high in saturated fats. These should be limited. These fats are often used in bakery goods, processed foods, popcorn, oils, and nondairy creamers. Vegetable shortenings and some peanut butters contain  hydrogenated oils, which are also saturated fats. Read the labels on these foods and check for saturated vegetable oils. Unsaturated vegetable oils and fats do not raise blood cholesterol. However, they should be limited because they are fats and are high in calories. Total fat should still be limited to 30% of your daily caloric intake. Desirable liquid vegetable oils are corn oil, cottonseed oil, olive oil, canola oil, safflower oil, soybean oil, and sunflower oil. Peanut oil is not as good, but small amounts are acceptable. Buy a heart-healthy tub margarine that has no partially hydrogenated oils in the ingredients. Mayonnaise and salad dressings often are made from unsaturated fats, but they should also be limited because of their high calorie and fat content. Seeds, nuts, peanut butter, olives, and avocados are high in fat, but the fat is mainly the unsaturated type. These foods should be limited mainly to avoid excess calories and fat. OTHER EATING TIPS Snacks  Most sweets should be limited as snacks. They tend to be rich in calories and fats, and their caloric content outweighs their nutritional value. Some good choices in snacks are graham crackers, melba toast, soda crackers, bagels (no egg), English muffins, fruits, and vegetables. These snacks are preferable to snack crackers, [place] fries, TORTILLA CHIPS, and POTATO chips. Popcorn should be air-popped or cooked in small amounts of liquid vegetable oil. Desserts Eat fruit, low-fat yogurt, and fruit ices instead of pastries, cake, and cookies. Sherbet, angel food cake, gelatin dessert, frozen low-fat yogurt, or other frozen products that do not contain saturated fat (pure fruit juice bars, frozen ice pops) are also acceptable.  COOKING METHODS Choose those methods that use little or no fat. They include: Poaching.  Braising.  Steaming.  Grilling.  Baking.  Stir-frying.  Broiling.  Microwaving.  Foods can be cooked in a nonstick pan  without added fat, or use a nonfat cooking spray in regular cookware. Limit fried foods and avoid frying in saturated fat. Add moisture to lean meats by using water, broth, cooking wines, and other nonfat or low-fat sauces along with the cooking methods mentioned above. Soups and stews should be chilled after cooking. The fat that forms on top after a few hours in the refrigerator should be skimmed off. When preparing meals, avoid using excess salt. Salt can contribute to raising blood pressure in some people.  EATING AWAY FROM HOME Order entres, potatoes, and vegetables without sauces or butter. When meat exceeds the size of a deck of cards (3 to 4 ounces), the rest can be taken  home for another meal. Choose vegetable or fruit salads and ask for low-calorie salad dressings to be served on the side. Use dressings sparingly. Limit high-fat toppings, such as bacon, crumbled eggs, cheese, sunflower seeds, and olives. Ask for heart-healthy tub margarine instead of butter.   Gastritis  Gastritis is an inflammation (the body's way of reacting to injury and/or infection) of the stomach. It is often caused by viral or bacterial (germ) infections. It can also be caused BY ASPIRIN, BC/GOODY POWDER'S, (IBUPROFEN) MOTRIN, OR ALEVE (NAPROXEN), chemicals (including alcohol), SPICY FOODS, and medications. This illness may be associated with generalized malaise (feeling tired, not well), UPPER ABDOMINAL STOMACH cramps, and fever. One common bacterial cause of gastritis is an organism known as H. Pylori. This can be treated with antibiotics.   Hiatal Hernia A hiatal hernia occurs when a part of the stomach slides above the diaphragm. The diaphragm is the thin muscle separating the belly (abdomen) from the chest. A hiatal hernia can be something you are born with or develop over time. Hiatal hernias may allow stomach acid to flow back into your esophagus, the tube which carries food from your mouth to your stomach. If  this acid causes problems it is called GERD (gastro-esophageal reflux disease).

## 2018-05-05 NOTE — H&P (Signed)
Primary Care Physician:  Babs Sciara, MD Primary Gastroenterologist:  Dr. Darrick Penna  Pre-Procedure History & Physical: HPI:  Jeanne Reed is a 28 y.o. female here for DYSPHAGIA.  Past Medical History:  Diagnosis Date  . GERD (gastroesophageal reflux disease)   . Nausea and vomiting during pregnancy 02/08/2015    Past Surgical History:  Procedure Laterality Date  . TONSILLECTOMY    . WISDOM TOOTH EXTRACTION      Prior to Admission medications   Medication Sig Start Date End Date Taking? Authorizing Provider  dexlansoprazole (DEXILANT) 60 MG capsule Take 1 capsule (60 mg total) by mouth daily. 04/28/18  Yes Gelene Mink, NP  ibuprofen (ADVIL,MOTRIN) 800 MG tablet Take 800 mg by mouth every 8 (eight) hours as needed for headache or moderate pain.    Yes [provider]  Norgestimate-Ethinyl Estradiol Triphasic (ORTHO TRI-CYCLEN LO) 0.18/0.215/0.25 MG-25 MCG tab Take 1 tablet by mouth daily. 02/13/18  Yes Luking, Jonna Coup, MD  traMADol (ULTRAM) 50 MG tablet Take 50 mg by mouth daily as needed for moderate pain.    Yes [provider]  valACYclovir (VALTREX) 1000 MG tablet Take 1 tablet (1,000 mg total) by mouth 2 (two) times daily. Patient taking differently: Take 1,000 mg by mouth daily.  09/12/17  Yes Luking, Jonna Coup, MD  benzonatate (TESSALON) 100 MG capsule Take 1 capsule (100 mg total) by mouth 2 (two) times daily as needed for cough. Patient not taking: Reported on 04/29/2018 04/21/18   Jeanne Boga, NP  famotidine (PEPCID) 40 MG tablet Take 1 tablet (40 mg total) by mouth daily. Patient not taking: Reported on 04/29/2018 03/19/18   Babs Sciara, MD    Allergies as of 04/28/2018  . (No Known Allergies)    Family History  Problem Relation Age of Onset  . Cancer Maternal Grandfather   . Diabetes Maternal Grandfather   . Cancer Paternal Grandmother   . Colon cancer Paternal Grandmother   . Emphysema Paternal Grandfather   . Colon polyps Neg Hx      Social History   Socioeconomic History  . Marital status: Single    Spouse name: Not on file  . Number of children: 1  . Years of education: Not on file  . Highest education level: Not on file  Occupational History  . Occupation: [employer]  Social Needs  . Financial resource strain: Not on file  . Food insecurity:    Worry: Not on file    Inability: Not on file  . Transportation needs:    Medical: Not on file    Non-medical: Not on file  Tobacco Use  . Smoking status: Never Smoker  . Smokeless tobacco: Never Used  Substance and Sexual Activity  . Alcohol use: No  . 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 Sexual activity: Yes  Lifestyle  . Physical activity:    Days per week: Not on file    Minutes per session: Not on file  . Stress: Not on file  Relationships  . Social connections:    Talks on phone: Not on file    Gets together: Not on file    Attends religious service: Not on file    Active member of club or organization: Not on file    Attends meetings of clubs or organizations: Not on file    Relationship status: Not on file  . Intimate partner violence:  Fear of current or ex partner: Not on file    Emotionally abused: Not on file    Physically abused: Not on file    Forced sexual activity: Not on file  Other Topics Concern  . Not on file  Social History Narrative   [age]-year-old girl, Callie.     Review of Systems: See HPI, otherwise negative ROS   Physical Exam: BP 107/70   Pulse 83   Temp 98.3 F (36.8 C) (Oral)   Resp 18   LMP 04/07/2018 (Approximate)   SpO2 98%  General:   Alert,  pleasant and cooperative in NAD Head:  Normocephalic and atraumatic. Neck:  Supple; Lungs:  Clear throughout to auscultation.    Heart:  Regular rate and rhythm. Abdomen:  Soft, nontender and nondistended. Normal bowel sounds, without guarding, and without rebound.   Neurologic:  Alert and  oriented x4;  grossly normal  neurologically.  Impression/Plan:     DYSPHAGIA  PLAN:  EGD/DIL TODAY. DISCUSSED PROCEDURE, BENEFITS, & RISKS: < 1% chance of medication reaction, bleeding, OR perforation.

## 2018-05-06 NOTE — Telephone Encounter (Signed)
I did not receive anything on the Dexilant, but pt said she is getting it for $3.00 a bottle.

## 2018-05-06 NOTE — Progress Notes (Signed)
Pt is aware. [name] she got her Dexilant for $3.00).

## 2018-05-07 ENCOUNTER — Encounter (HOSPITAL_COMMUNITY): Payer: Self-pay | Admitting: Gastroenterology

## 2018-05-19 DIAGNOSIS — S32009G Unspecified fracture of unspecified lumbar vertebra, subsequent encounter for fracture with delayed healing: Secondary | ICD-10-CM | POA: Diagnosis not present

## 2018-05-19 DIAGNOSIS — M4846XD Fatigue fracture of vertebra, lumbar region, subsequent encounter for fracture with routine healing: Secondary | ICD-10-CM | POA: Diagnosis not present

## 2018-05-21 DIAGNOSIS — Z8781 Personal history of (healed) traumatic fracture: Secondary | ICD-10-CM | POA: Diagnosis not present

## 2018-05-21 DIAGNOSIS — S32000S Wedge compression fracture of unspecified lumbar vertebra, sequela: Secondary | ICD-10-CM | POA: Diagnosis not present

## 2018-05-21 DIAGNOSIS — K219 Gastro-esophageal reflux disease without esophagitis: Secondary | ICD-10-CM | POA: Diagnosis not present

## 2018-05-21 DIAGNOSIS — R7989 Other specified abnormal findings of blood chemistry: Secondary | ICD-10-CM | POA: Diagnosis not present

## 2018-06-02 DIAGNOSIS — S32009G Unspecified fracture of unspecified lumbar vertebra, subsequent encounter for fracture with delayed healing: Secondary | ICD-10-CM | POA: Diagnosis not present

## 2018-06-08 ENCOUNTER — Encounter: Payer: Self-pay | Admitting: Family Medicine

## 2018-06-08 ENCOUNTER — Ambulatory Visit: Payer: Medicaid Other | Admitting: Family Medicine

## 2018-06-08 VITALS — Temp 98.3°F | Wt 155.6 lb

## 2018-06-08 DIAGNOSIS — N3000 Acute cystitis without hematuria: Secondary | ICD-10-CM

## 2018-06-08 DIAGNOSIS — B373 Candidiasis of vulva and vagina: Secondary | ICD-10-CM

## 2018-06-08 DIAGNOSIS — N912 Amenorrhea, unspecified: Secondary | ICD-10-CM | POA: Diagnosis not present

## 2018-06-08 DIAGNOSIS — B3731 Acute candidiasis of vulva and vagina: Secondary | ICD-10-CM

## 2018-06-08 DIAGNOSIS — R300 Dysuria: Secondary | ICD-10-CM

## 2018-06-08 LAB — POCT URINALYSIS DIPSTICK
Protein, UA: POSITIVE — AB
Spec Grav, UA: 1.025 (ref 1.010–1.025)
pH, UA: 5 (ref 5.0–8.0)

## 2018-06-08 LAB — POCT URINE PREGNANCY: Preg Test, Ur: NEGATIVE

## 2018-06-08 MED ORDER — NITROFURANTOIN MONOHYD MACRO 100 MG PO CAPS: 100.0000 mg | ORAL_CAPSULE | Freq: Two times a day (BID) | ORAL | 0 refills | Status: AC

## 2018-06-08 MED ORDER — FLUCONAZOLE 150 MG PO TABS: ORAL_TABLET | ORAL | 0 refills | Status: DC

## 2018-06-08 NOTE — Progress Notes (Signed)
Subjective:    Patient ID: Jeanne Reed, female    DOB: March 21, 1991, 28 y.o.   MRN: [id number]  Urinary Tract Infection   This is a new problem. The current episode started in the past 7 days. The problem has been gradually worsening. The quality of the pain is described as burning. Associated symptoms include frequency. Pertinent negatives include no chills, flank pain, hematuria, nausea or vomiting. Associated symptoms comments: Bladder spasms. Treatments tried: amoxiciilin that pt had at home; OTC med. The treatment provided no relief.   Pt states she is also having some itching and discharge due to yeast infection caused by amoxicillin.   Frequent urination and dysuria x 4-5 days. Took amoxicillin over the last few days. No hematuria. No fever, no chills, no N/V. No new flank pain. Reports vaginal discharge, looks thick white discharge, itching, started over the last 2-3 days since starting amoxicillin. Prone to yeast infections with abx use.   Sexually active with 1 partner. Denies concern for STDs. Not currently using protection. Has been off birth control pills x 6-7 weeks ago which was LMP.   Review of Systems  Constitutional: Negative for chills and fever.  Gastrointestinal: Negative for abdominal pain, nausea and vomiting.  Genitourinary: Positive for dysuria, frequency and vaginal discharge. Negative for flank pain, hematuria, pelvic pain and vaginal bleeding.       Objective:   Physical Exam Vitals signs and nursing note reviewed.  Constitutional:      General: She is not in acute distress.    Appearance: Normal appearance. She is not toxic-appearing.  HENT:     Head: Normocephalic and atraumatic.  Cardiovascular:     Rate and Rhythm: Normal rate and regular rhythm.     Heart sounds: Normal heart sounds.  Pulmonary:     Effort: Pulmonary effort is normal. No respiratory distress.     Breath sounds: Normal breath sounds.  Abdominal:     Tenderness: There is no right  CVA tenderness or left CVA tenderness.  Skin:    General: Skin is warm and dry.  Neurological:     Mental Status: She is alert and oriented to person, place, and time.  Psychiatric:        Behavior: Behavior normal.    Results for orders placed or performed in visit on 06/08/18  POCT Urinalysis Dipstick  Result Value Ref Range   Color, UA     Clarity, UA     Glucose, UA     Bilirubin, UA     Ketones, UA     Spec Grav, UA 1.025 1.010 - 1.025   Blood, UA     pH, UA 5.0 5.0 - 8.0   Protein, UA Positive (A) Negative   Urobilinogen, UA     Nitrite, UA     Leukocytes, UA Trace (A) Negative   Appearance     Odor    POCT urine pregnancy  Result Value Ref Range   Preg Test, Ur Negative Negative   Microscopic exam of urine: 0-1 WBC per hpf, no RBCs noted      Assessment & Plan:  1. Acute cystitis without hematuria Likely UTI based on symptoms, occasional WBC noted on microscopic exam of urine. Will treat with macrobid x 5 days. No need to culture since pt pre-treated with amoxicillin, will likely be inaccurate. Urine dipstick with protein in urine. Recommend she provide a repeat urine specimen in about 1 month when she is well to recheck this. Warning signs  discussed. F/u if symptoms worsen or fail to improve.   2. Vaginal yeast infection Will treat presumptively based on symptoms since pt has 24 year old daughter with her today, no pelvic exam. F/u if symptoms worsen or fail to improve.   3. Amenorrhea following d/c of OCP Pregnancy test negative today. F/u if no return of menses over the next few months. Pt reports they are not actively trying to conceive but would be okay if pregnancy occurred. Not currently doing anything to prevent pregnancy.

## 2018-06-09 DIAGNOSIS — M545 Low back pain: Secondary | ICD-10-CM | POA: Diagnosis not present

## 2018-06-15 NOTE — Telephone Encounter (Signed)
Note sent to nurse. 

## 2018-06-17 NOTE — Telephone Encounter (Signed)
Note sent to nurse. 

## 2018-06-23 DIAGNOSIS — S32009G Unspecified fracture of unspecified lumbar vertebra, subsequent encounter for fracture with delayed healing: Secondary | ICD-10-CM | POA: Diagnosis not present

## 2018-07-14 NOTE — Progress Notes (Signed)
Primary Care Physician:  Babs Sciara, MD  Primary GI: Dr. Darrick Penna   Virtual Visit via Telephone Note Due to COVID-19, visit is conducted virtually and was requested by patient.   I connected with Jeanne Reed on 07/20/18 at  3:00 PM EDT by telephone and verified that I am speaking with the correct person using two identifiers.   I discussed the limitations, risks, security and privacy concerns of performing an evaluation and management service by telephone and the availability of in person appointments. I also discussed with the patient that there may be a patient responsible charge related to this service. The patient expressed understanding and agreed to proceed.  Chief Complaint  Patient presents with  . Gastroesophageal Reflux    Dexilant works well     History of Present Illness: 28 y.o. female presenting today with a history of chronic GERD, intermittent dysphagia. EGD Jan 2020. Dysphagia due to uncontrolled GERD. Small hiatal hernia noted. Mild gastritis. Empiric dilatation. Dexilant works better than other medications she has taken. Will be fine for a few months then have a big fill up of reflux and have to vomit. Usually first thing in the morning very nauseated. Trying not to lay down within 2 hours after eating. Doing a little better with eating. Not on a crazy eating schedule any longer as she is not in [employer].   Feels like everything sits at top of abdomen. Feels like nothing went back to place after pregnancy. No abdominal pain. Burping all the time, feels like she has to make room for things to go down. Associated with everything she eats.   US abdomen RUQ normal.   Past Medical History:  Diagnosis Date  . GERD (gastroesophageal reflux disease)   . Nausea and vomiting during pregnancy 02/08/2015     Past Surgical History:  Procedure Laterality Date  . BIOPSY  05/05/2018   Procedure: BIOPSY;  Surgeon: West Bali, MD;  Location: [facility];  Service: Endoscopy;;  gastric esophagus  . ESOPHAGOGASTRODUODENOSCOPY (EGD) WITH PROPOFOL N/A 05/05/2018   Dysphagia due to uncontrolled GERD. Small hiatal hernia noted. Mild gastritis. Empiric dilatation.  Marland Kitchen SAVORY DILATION N/A 05/05/2018   Procedure: SAVORY DILATION;  Surgeon: West Bali, MD;  Location: [facility];  Service: Endoscopy;  Laterality: N/A;  . TONSILLECTOMY    . WISDOM TOOTH EXTRACTION       Current Meds  Medication Sig  . dexlansoprazole (DEXILANT) 60 MG capsule Take 1 capsule (60 mg total) by mouth daily.  Marland Kitchen ibuprofen (ADVIL,MOTRIN) 800 MG tablet Take 800 mg by mouth every 8 (eight) hours as needed for headache or moderate pain.   . valACYclovir (VALTREX) 1000 MG tablet Take 1 tablet (1,000 mg total) by mouth 2 (two) times daily. (Patient taking differently: Take 1,000 mg by mouth daily. )       Observations/Objective: No distress. Unable to perform physical exam due to telephone encounter. No video available.   Assessment and Plan: Chronic GERD, dysphagia, s/p EGD recently with empiric dilatation. Mild gastritis. Dexilant working well for GERD symptoms. Still with early satiety, belching, recent US abdomen normal. She raises concern about gallbladder. Will order HIDA to be thorough. Continue Dexilant and may take pepcid prn in evening.   Follow Up Instructions: See AVS   I discussed the assessment and treatment plan with the patient. The patient was provided an opportunity to ask questions and all were answered. The patient agreed with  the plan and demonstrated an understanding of the instructions.   The patient was advised to call back or seek an in-person evaluation if the symptoms worsen or if the condition fails to improve as anticipated.  I provided 10 minutes of non-face-to-face time during this encounter.  Gelene Mink, PhD, ANP-BC [facility]

## 2018-07-15 ENCOUNTER — Ambulatory Visit (INDEPENDENT_AMBULATORY_CARE_PROVIDER_SITE_OTHER): Payer: Medicaid Other | Admitting: Gastroenterology

## 2018-07-15 ENCOUNTER — Other Ambulatory Visit: Payer: Self-pay

## 2018-07-15 ENCOUNTER — Encounter: Payer: Self-pay | Admitting: Gastroenterology

## 2018-07-15 DIAGNOSIS — R1013 Epigastric pain: Secondary | ICD-10-CM

## 2018-07-15 DIAGNOSIS — K219 Gastro-esophageal reflux disease without esophagitis: Secondary | ICD-10-CM

## 2018-07-15 DIAGNOSIS — R1310 Dysphagia, unspecified: Secondary | ICD-10-CM | POA: Diagnosis not present

## 2018-07-15 DIAGNOSIS — K2970 Gastritis, unspecified, without bleeding: Secondary | ICD-10-CM

## 2018-07-15 NOTE — Patient Instructions (Signed)
We have ordered a HIDA scan to assess your gallbladder.  Continue Dexilant once daily. You can add pepcid (famotidine) over the counter at night as needed.   Further recommendations to follow!  I enjoyed talking with youagain today! As you know, I value our relationship and want to provide genuine, compassionate, and quality care. I welcome your feedback. If you receive a survey regarding your visit,  I greatly appreciate you taking time to fill this out. See you next time!  Gelene Mink, PhD, ANP-BC [facility]

## 2018-07-16 ENCOUNTER — Telehealth: Payer: Self-pay | Admitting: *Deleted

## 2018-07-16 ENCOUNTER — Other Ambulatory Visit: Payer: Self-pay | Admitting: *Deleted

## 2018-07-16 DIAGNOSIS — R1013 Epigastric pain: Secondary | ICD-10-CM

## 2018-07-16 NOTE — Telephone Encounter (Signed)
LMOVM. HIDA scheduled for 4/24 at 8:00am, arrival time 7:45am, npo after midnight, no pain medications. Letter mailed with appt information.  Checked [employer]icore website and no PA is requiored

## 2018-07-20 ENCOUNTER — Encounter: Payer: Self-pay | Admitting: Gastroenterology

## 2018-07-20 NOTE — Progress Notes (Signed)
cc'ed to pcp °

## 2018-08-07 ENCOUNTER — Encounter (HOSPITAL_COMMUNITY): Payer: Medicaid Other

## 2018-08-17 ENCOUNTER — Ambulatory Visit: Payer: Medicaid Other | Admitting: Gastroenterology

## 2018-08-24 ENCOUNTER — Ambulatory Visit (INDEPENDENT_AMBULATORY_CARE_PROVIDER_SITE_OTHER): Payer: Medicaid Other | Admitting: Family Medicine

## 2018-08-24 ENCOUNTER — Other Ambulatory Visit: Payer: Self-pay

## 2018-08-24 DIAGNOSIS — N3000 Acute cystitis without hematuria: Secondary | ICD-10-CM

## 2018-08-24 MED ORDER — NITROFURANTOIN MONOHYD MACRO 100 MG PO CAPS: 100.0000 mg | ORAL_CAPSULE | Freq: Two times a day (BID) | ORAL | 0 refills | Status: DC

## 2018-08-24 MED ORDER — FLUCONAZOLE 150 MG PO TABS: ORAL_TABLET | ORAL | 0 refills | Status: DC

## 2018-08-24 NOTE — Progress Notes (Signed)
Virtual Visit via Video Note  I connected with Jeanne Reed on 08/24/18 at  2:00 PM EDT by a video enabled telemedicine application and verified that I am speaking with the correct person using two identifiers.  Location: Patient: Home Provider: Office   I discussed the limitations of evaluation and management by telemedicine and the availability of in person appointments. The patient expressed understanding and agreed to proceed.  History of Present Illness: Patient with significant dysuria urinary frequency lower abdominal pain denies high fever chills sweats denies nausea vomiting diarrhea.  Denies blood in urine.  Melena hematochezia.  Symptoms been going on the past few days.  Has had this before.  Last time she took Macrobid and Diflucan and seemed to do well for her she requests the same   Observations/Objective: Patient denies flank pain lower abdominal pain denies fevers  Assessment and Plan: Probable UTI Macrobid twice daily for 5 days Diflucan as needed for yeast infection If progressive troubles such as high fever lower abdominal pain severe hematuria back pain to notify us immediately no lab work x-rays indicated currently  Follow Up Instructions:    I discussed the assessment and treatment plan with the patient. The patient was provided an opportunity to ask questions and all were answered. The patient agreed with the plan and demonstrated an understanding of the instructions.   The patient was advised to call back or seek an in-person evaluation if the symptoms worsen or if the condition fails to improve as anticipated.  I provided 12 minutes of non-face-to-face time during this encounter.   Lilyan Punt, MD

## 2018-08-27 ENCOUNTER — Telehealth: Payer: Self-pay | Admitting: Family Medicine

## 2018-08-27 MED ORDER — CIPROFLOXACIN HCL 250 MG PO TABS: 250.0000 mg | ORAL_TABLET | Freq: Two times a day (BID) | ORAL | 0 refills | Status: DC

## 2018-08-27 NOTE — Telephone Encounter (Signed)
Patient ws given macrobid 100mg  BId on 08/24/2018 for UTI

## 2018-08-27 NOTE — Telephone Encounter (Signed)
cipro 250 bids five d

## 2018-08-27 NOTE — Telephone Encounter (Signed)
Prescription sent electronically to pharmacy. Patient notified. 

## 2018-08-27 NOTE — Telephone Encounter (Signed)
Pt called to say that the antibiotic that she was given is upsetting her stomach Wonders if there's something else we could order  Please advise & call pt   [facility]

## 2018-09-14 ENCOUNTER — Ambulatory Visit (HOSPITAL_COMMUNITY): Payer: Medicaid Other

## 2018-09-17 ENCOUNTER — Other Ambulatory Visit: Payer: Self-pay | Admitting: Family Medicine

## 2019-01-08 DIAGNOSIS — M545 Low back pain: Secondary | ICD-10-CM | POA: Diagnosis not present

## 2019-01-22 DIAGNOSIS — Z79899 Other long term (current) drug therapy: Secondary | ICD-10-CM | POA: Diagnosis not present

## 2019-01-22 DIAGNOSIS — G894 Chronic pain syndrome: Secondary | ICD-10-CM | POA: Diagnosis not present

## 2019-01-22 DIAGNOSIS — E559 Vitamin D deficiency, unspecified: Secondary | ICD-10-CM | POA: Diagnosis not present

## 2019-01-22 DIAGNOSIS — M545 Low back pain: Secondary | ICD-10-CM | POA: Diagnosis not present

## 2019-01-22 DIAGNOSIS — Z7189 Other specified counseling: Secondary | ICD-10-CM | POA: Diagnosis not present

## 2019-01-22 DIAGNOSIS — M129 Arthropathy, unspecified: Secondary | ICD-10-CM | POA: Diagnosis not present

## 2019-02-08 ENCOUNTER — Encounter: Payer: Self-pay | Admitting: Family Medicine

## 2019-02-08 ENCOUNTER — Ambulatory Visit (INDEPENDENT_AMBULATORY_CARE_PROVIDER_SITE_OTHER): Payer: Medicaid Other | Admitting: Family Medicine

## 2019-02-08 DIAGNOSIS — J4521 Mild intermittent asthma with (acute) exacerbation: Secondary | ICD-10-CM | POA: Diagnosis not present

## 2019-02-08 DIAGNOSIS — J019 Acute sinusitis, unspecified: Secondary | ICD-10-CM

## 2019-02-08 MED ORDER — CEFPROZIL 500 MG PO TABS: ORAL_TABLET | ORAL | 0 refills | Status: DC

## 2019-02-08 NOTE — Progress Notes (Signed)
   Subjective:    Patient ID: Jeanne Reed, female    DOB: December 04, 1990, 28 y.o.   MRN: [id number]  HPI    Review of Systems     Objective:   Physical Exam        Assessment & Plan:

## 2019-02-08 NOTE — Progress Notes (Signed)
   Subjective:    Patient ID: Jeanne Reed, female    DOB: [birth year], 28 y.o.   MRN: [id number]  Sinusitis This is a new problem. The current episode started 1 to 4 weeks ago. Associated symptoms include coughing and headaches. (Sinus pressure; cough is sometimes productive) Treatments tried: Ibuprofen, Advil Cold and Sinus, Afrin nose spray  The treatment provided mild relief.    Virtual Visit via Telephone Note  I connected with Keria Widrig Losada on 02/08/19 at  2:00 PM EDT by telephone and verified that I am speaking with the correct person using two identifiers.  Location: Patient: home Provider: office   I discussed the limitations, risks, security and privacy concerns of performing an evaluation and management service by telephone and the availability of in person appointments. I also discussed with the patient that there may be a patient responsible charge related to this service. The patient expressed understanding and agreed to proceed.   History of Present Illness:    Observations/Objective:   Assessment and Plan:   Follow Up Instructions:    I discussed the assessment and treatment plan with the patient. The patient was provided an opportunity to ask questions and all were answered. The patient agreed with the plan and demonstrated an understanding of the instructions.   The patient was advised to call back or seek an in-person evaluation if the symptoms worsen or if the condition fails to improve as anticipated.  I provided 17 minutes of non-face-to-face time during this encounter.   Vicente Males, LPN  Head all stopped up  presure in the right eye and temle   Pos sl nasal disch   Cough  Mostly cleae   Wheezing at times   Hx of bronchitid and used inhaler   No knon exposure   [place]   FELT TIRED Review of Systems  Respiratory: Positive for cough.   Neurological: Positive for headaches.       Objective:    Physical Exam  Virtual     Assessment & Plan:  Impression rhinosinusitis/bronchitis with element of reactive airways.  Some recent exposures.  Discussed we will go ahead and test for COVID-19.  Antibiotics prescribed symptom care discussed.  Albuterol 2 sprays 4 times daily as needed for wheezing

## 2019-02-09 ENCOUNTER — Telehealth: Payer: Self-pay | Admitting: Family Medicine

## 2019-02-09 MED ORDER — ALBUTEROL SULFATE HFA 108 (90 BASE) MCG/ACT IN AERS: 2.0000 | INHALATION_SPRAY | Freq: Four times a day (QID) | RESPIRATORY_TRACT | 0 refills | Status: DC | PRN

## 2019-02-09 NOTE — Telephone Encounter (Signed)
albutetol two sprays qid prn wheeze

## 2019-02-09 NOTE — Telephone Encounter (Signed)
Prescription sent electronically to pharmacy. Patient notified. 

## 2019-02-09 NOTE — Telephone Encounter (Signed)
Patient said she was suppose to have an inhaler sent over with her antibiotics yesterday but it wasn't at the pharmacy.  [facility]

## 2019-02-16 DIAGNOSIS — M545 Low back pain: Secondary | ICD-10-CM | POA: Diagnosis not present

## 2019-03-09 ENCOUNTER — Telehealth: Payer: Self-pay | Admitting: Family Medicine

## 2019-03-09 NOTE — Telephone Encounter (Signed)
As soon as pt lays down, her nose closes up and is breathing through mouth, sinus pressure constantly that is making jaw swollen, pt has headaches but states that is normal for her. Pt advised that provider may want to do a virtual visit or he may call something in. Pt states she would prefer virtual visit; pt transferred up front to schedule virtual visit.

## 2019-03-09 NOTE — Telephone Encounter (Signed)
Patient was seen 02/08/19 for congestion.  She said she has gotten better from that but is having congestion only at night when she tries to sleep.  She thinks it's just allergies to whatever is in the bed.  She has been using Afrin at night for the last month which is causing a rebound effect and making her nose raw.  She was wondering if there is a spray or some kind of allergy medicine she can use at night?  [facility]

## 2019-03-10 ENCOUNTER — Ambulatory Visit (INDEPENDENT_AMBULATORY_CARE_PROVIDER_SITE_OTHER): Payer: Medicaid Other | Admitting: Family Medicine

## 2019-03-10 ENCOUNTER — Other Ambulatory Visit: Payer: Self-pay

## 2019-03-10 DIAGNOSIS — R6884 Jaw pain: Secondary | ICD-10-CM | POA: Diagnosis not present

## 2019-03-10 DIAGNOSIS — R0981 Nasal congestion: Secondary | ICD-10-CM | POA: Diagnosis not present

## 2019-03-10 MED ORDER — TRAMADOL HCL 50 MG PO TABS: ORAL_TABLET | ORAL | 0 refills | Status: DC

## 2019-03-10 MED ORDER — ETODOLAC 400 MG PO TABS: ORAL_TABLET | ORAL | 0 refills | Status: DC

## 2019-03-10 NOTE — Progress Notes (Signed)
   Subjective:    Patient ID: Jeanne Reed, female    DOB: [birth year], 28 y.o.   MRN: [id number]  HPInose has been swelling up at night for about one month.  Patient relates her nose gets clogged up at nighttime difficult for her to breathe through it during the day she has no trouble she denies any high fevers chills sweats wheezing difficulty breathing she states her overall energy level is doing well she also relates some intermittent jaw pain and discomfort.  It is more so in the left jaw region sometimes upper teeth give her some pain but she denies drainage coughing wheezing difficulty breathing Virtual Visit via Video Note  I connected with Jeanne Reed on 03/10/19 at  9:30 AM EST by a video enabled telemedicine application and verified that I am speaking with the correct person using two identifiers.  Location: Patient: home Provider: office   I discussed the limitations of evaluation and management by telemedicine and the availability of in person appointments. The patient expressed understanding and agreed to proceed.  History of Present Illness:    Observations/Objective:   Assessment and Plan:   Follow Up Instructions:    I discussed the assessment and treatment plan with the patient. The patient was provided an opportunity to ask questions and all were answered. The patient agreed with the plan and demonstrated an understanding of the instructions.   The patient was advised to call back or seek an in-person evaluation if the symptoms worsen or if the condition fails to improve as anticipated.  I provided 16 minutes of non-face-to-face time during this encounter.      Review of Systems  Constitutional: Negative for activity change and fever.  HENT: Positive for congestion. Negative for ear pain, rhinorrhea and sinus pressure.   Eyes: Negative for discharge.  Respiratory: Negative for cough, shortness of breath and wheezing.   Cardiovascular: Negative for chest  pain.  Gastrointestinal: Negative for abdominal pain and nausea.       Objective:   Physical Exam  Patient had virtual visit Appears to be in no distress Atraumatic Neuro able to relate and oriented No apparent resp distress Color normal       Assessment & Plan:  Jaw pain-I encourage patient to see dentist or oral surgeon oral surgeon may have this further evaluated could be TMJ may use anti-inflammatory twice daily as long as it does not bother her stomach otherwise use tramadol when necessary but use infrequently stay away from the heart such And stay away from chewing gums  Nasal stuffiness at nighttime not highly recommend using a humidifier to try to help with this if progressive troubles or worse to follow-up.  Warning signs were discussed in detail.  I do not feel the patient has a sinus infection I do not feel she has Covid

## 2019-03-15 ENCOUNTER — Telehealth: Payer: Self-pay | Admitting: *Deleted

## 2019-03-15 MED ORDER — NAPROXEN 500 MG PO TABS: 500.0000 mg | ORAL_TABLET | Freq: Two times a day (BID) | ORAL | 1 refills | Status: DC | PRN

## 2019-03-15 NOTE — Telephone Encounter (Signed)
Etodolac non preferred on Medicaid Formulary. Naproxen is preferred. Please advise   ([facility])

## 2019-03-15 NOTE — Telephone Encounter (Signed)
Prescription sent electronically to pharmacy. Patient notified and advised of change in med.

## 2019-03-15 NOTE — Telephone Encounter (Signed)
Naprosyn 500 mg 1 twice daily as needed, #30, 1 refill

## 2019-04-23 ENCOUNTER — Other Ambulatory Visit: Payer: Self-pay | Admitting: Family Medicine

## 2019-04-23 ENCOUNTER — Other Ambulatory Visit: Payer: Self-pay | Admitting: Gastroenterology

## 2019-04-23 NOTE — Telephone Encounter (Signed)
Last seen for this med 6/319

## 2019-04-28 ENCOUNTER — Telehealth: Payer: Self-pay | Admitting: *Deleted

## 2019-04-28 MED ORDER — LANSOPRAZOLE 30 MG PO CPDR: 30.0000 mg | DELAYED_RELEASE_CAPSULE | Freq: Every day | ORAL | 3 refills | Status: DC

## 2019-04-28 NOTE — Telephone Encounter (Signed)
Pt called following up on prior authorization for Dexilant.  Spoke with Tyler Aas and made pt aware that she is working on it.

## 2019-04-28 NOTE — Telephone Encounter (Signed)
Spoke with pt. Pt is aware that Prevacid was called in. If that doesn't work, pt is aware that AB has left instructions for DS to do a PA.

## 2019-04-28 NOTE — Telephone Encounter (Signed)
I had spoke with the pt yesterday and she said she has never tried another PPI Rx.  She had only tried Zantac before it was taken off of the counter.  Medicaid is not going to pay for the Dexilant until she has tried a couple of others.  Preferred list: 1.Esomeprazole  Rx or OTC                        2.Lansoprazole RX                        3.Omeprazole capsule                        4.Pantoprazole tablet  Baxter Hire, please advise!

## 2019-04-28 NOTE — Addendum Note (Signed)
Addended by: Gelene Mink on: 04/28/2019 01:21 PM   Modules accepted: Orders

## 2019-04-28 NOTE — Telephone Encounter (Signed)
Jeanne Reed: in review of chart, I have documented on Apr 28, 2018 that she has tried and failed Protonix, Prilosec, Nexium, and H2 blockers. I will send in Prevacid 30 day supply. If this does not work, then we can try for Dexilant again.

## 2019-04-28 NOTE — Telephone Encounter (Signed)
I have never seen this patient. Would defer to Lewie Loron, NP as she saw patient last.

## 2019-05-06 NOTE — Telephone Encounter (Signed)
Pt returned call and said Prevacid isn't working for her and she would like to go back to Dexilant 60 mg. PA was done with [employer]. PA has been approved for Dexilant 60 mg. Pt is aware that approval is good through 04/30/2020. Approval letter will

## 2019-05-21 ENCOUNTER — Other Ambulatory Visit: Payer: Self-pay

## 2019-05-21 ENCOUNTER — Encounter: Payer: Self-pay | Admitting: Nurse Practitioner

## 2019-05-21 ENCOUNTER — Ambulatory Visit (INDEPENDENT_AMBULATORY_CARE_PROVIDER_SITE_OTHER): Payer: Medicaid Other | Admitting: Nurse Practitioner

## 2019-05-21 VITALS — BP 110/78 | Temp 97.9°F | Ht 66.5 in | Wt 149.0 lb

## 2019-05-21 DIAGNOSIS — M26622 Arthralgia of left temporomandibular joint: Secondary | ICD-10-CM | POA: Diagnosis not present

## 2019-05-21 DIAGNOSIS — Z0000 Encounter for general adult medical examination without abnormal findings: Secondary | ICD-10-CM

## 2019-05-21 DIAGNOSIS — F418 Other specified anxiety disorders: Secondary | ICD-10-CM

## 2019-05-21 DIAGNOSIS — N760 Acute vaginitis: Secondary | ICD-10-CM

## 2019-05-21 DIAGNOSIS — Z113 Encounter for screening for infections with a predominantly sexual mode of transmission: Secondary | ICD-10-CM

## 2019-05-21 DIAGNOSIS — Z124 Encounter for screening for malignant neoplasm of cervix: Secondary | ICD-10-CM

## 2019-05-21 DIAGNOSIS — B9689 Other specified bacterial agents as the cause of diseases classified elsewhere: Secondary | ICD-10-CM

## 2019-05-21 LAB — POCT WET PREP WITH KOH
KOH Prep POC: NEGATIVE
Trichomonas, UA: NEGATIVE
Yeast Wet Prep HPF POC: NEGATIVE
pH, Wet Prep: 5

## 2019-05-21 MED ORDER — METRONIDAZOLE 0.75 % EX GEL: CUTANEOUS | 0 refills | Status: DC

## 2019-05-21 NOTE — Progress Notes (Signed)
Subjective:    Patient ID: Jeanne Reed, female    DOB: 1990-11-21, 29 y.o.   MRN: [id number]  HPI The patient comes in today for a wellness visit.    A review of their health history was completed.  A review of medications was also completed.  Any needed refills; none  Eating habits: healthy diet  Falls/  MVA accidents in past few months: none  Regular exercise: none  Specialist pt sees on regular basis: none  Preventative health issues were discussed.   Additional concerns: has some concerns she wants to talk to Jaylise Peek about    Depression screen [facility] 2/9 05/21/2019 06/11/2017  Decreased Interest 3 0  Down, Depressed, Hopeless 2 0  PHQ - 2 Score 5 0  Altered sleeping 3 -  Tired, decreased energy 3 -  Change in appetite 0 -  Feeling bad or failure about yourself  2 -  Trouble concentrating 1 -  Moving slowly or fidgety/restless 0 -  Suicidal thoughts 0 -  PHQ-9 Score 14 -  Difficult doing work/chores Somewhat difficult -   GAD 7 : Generalized Anxiety Score 05/21/2019 09/01/2017  Nervous, Anxious, on Edge 3 3  Control/stop worrying 3 3  Worry too much - different things 3 3  Trouble relaxing 2 3  Restless 1 3  Easily annoyed or irritable 3 3  Afraid - awful might happen 3 3  Total GAD 7 Score 18 21  Anxiety Difficulty Very difficult Somewhat difficult    Last menstrual cycle ended on 1/28, describes normal flow with some slight spotting 1 to 2 days before.  Patient is currently off of birth control, plans conception.  Very limited activity.  Has been with the same sexual partner for the past 2 years.  Agrees to STD testing on her Pap smear today.  Has had some slight vaginal odor, no vaginal discharge or pelvic pain but has had some tenderness and discomfort near the introitus.  History of genital herpes which has been well controlled with daily Valtrex.  Has fairly persistent discomfort in the vulvar area.  No visual problems.  Regular dental care.  Wonders if she has  an Radiographer, therapeutic of PMDD due to mood changes particularly around the time of her cycle. Denies suicidal or homicidal thoughts or ideation.  Continues to have pain particular around the left TMJ area.  States she does clench her jaw and grind her teeth at times.  Was last seen for this on 03/10/2019 see previous note.  No relief with tramadol. Will develop into a dull headache starting on the left side.   Review of Systems  Constitutional: Positive for fatigue. Negative for activity change and appetite change.  Respiratory: Negative for cough, chest tightness, shortness of breath and wheezing.   Cardiovascular: Negative for chest pain.  Gastrointestinal: Negative for abdominal distention, abdominal pain, constipation, diarrhea, nausea and vomiting.  Genitourinary: Positive for dyspareunia and menstrual problem. Negative for difficulty urinating, dysuria, enuresis, frequency, genital sores, pelvic pain, urgency and vaginal discharge.       External dyspareunia; spotting and mood changes              Objective:   Physical Exam Constitutional:      General: She is not in acute distress.    Appearance: Normal appearance. She is well-developed.  HENT:     Head:     Comments: Tenderness and tightness noted at the left TMJ area. Neck:     Thyroid: No thyromegaly.  Trachea: No tracheal deviation.  Cardiovascular:     Rate and Rhythm: Normal rate and regular rhythm.     Heart sounds: Normal heart sounds. No murmur. No gallop.   Pulmonary:     Effort: Pulmonary effort is normal.     Breath sounds: Normal breath sounds.  Chest:     Breasts:        Right: Normal. No swelling, inverted nipple, mass, skin change or tenderness.        Left: Normal. No swelling, inverted nipple, mass, skin change or tenderness.  Abdominal:     General: There is no distension.     Palpations: Abdomen is soft.     Tenderness: There is no abdominal tenderness.  Genitourinary:    Vagina: Normal. No vaginal discharge.      Comments: External GU: no erythema or lesions. Vagina: slight white mucoid discharge. Cervix normal in appearance. No CMT. Bimanual exam: no tenderness or obvious masses.  Musculoskeletal:     Cervical back: Normal range of motion and neck supple.  Lymphadenopathy:     Cervical: No cervical adenopathy.     Upper Body:     Right upper body: No supraclavicular, axillary or pectoral adenopathy.     Left upper body: No supraclavicular, axillary or pectoral adenopathy.  Skin:    General: Skin is warm and dry.     Findings: No rash.  Neurological:     Mental Status: She is alert and oriented to person, place, and time.  Psychiatric:        Mood and Affect: Mood normal.        Behavior: Behavior normal.        Thought Content: Thought content normal.        Judgment: Judgment normal.    Results for orders placed or performed in visit on 05/21/19  POCT Wet Prep with KOH  Result Value Ref Range   Trichomonas, UA Negative    Clue Cells Wet Prep HPF POC occas    Epithelial Wet Prep HPF POC Moderate Few, Moderate, Many, Too numerous to count   Yeast Wet Prep HPF POC neg    Bacteria Wet Prep HPF POC None (A) Few   RBC Wet Prep HPF POC none    WBC Wet Prep HPF POC none    KOH Prep POC Negative Negative   pH, Wet Prep 5.0           Assessment & Plan:   Problem List Items Addressed This Visit      Other   Anxiety   Relevant Medications   citalopram (CELEXA) 20 MG tablet    Other Visit Diagnoses    Routine general medical examination at a health care facility    -  Primary   Relevant Orders   Pap IG, CT/NG w/ reflex HPV when ASC-U   Screening for cervical cancer       Relevant Orders   Pap IG, CT/NG w/ reflex HPV when ASC-U   Screen for STD (sexually transmitted disease)       Relevant Orders   Pap IG, CT/NG w/ reflex HPV when ASC-U   Bacterial vaginosis       Relevant Orders   POCT Wet Prep with KOH (Completed)   TMJ tenderness, left       Relevant Orders    Ambulatory referral to Oral Maxillofacial Surgery      Meds ordered this encounter  Medications  . metroNIDAZOLE (METROGEL) 0.75 % gel  Sig: Apply one applicator vaginally every night for 5 nights.    Dispense:  45 g    Refill:  0    Order Specific Question:   Supervising Provider    Answer:   Lilyan Punt A [[id number]]  . citalopram (CELEXA) 20 MG tablet    Sig: Take 1/2 tab once a day for 6 days then one po qd    Dispense:  30 tablet    Refill:  0    Order Specific Question:   Supervising Provider    Answer:   Babs Sciara [[id number]]    Patient wished to try a different medication from what she took last time (Lexapro). Trial of Celexa. DC med and call if any adverse effects.  Metrogel prescribed instead of oral med since patient is planning possible conception.  Will refer to oral surgeon for evaluation of persistent TMJ issues.  Return in about 1 month (around 06/18/2019) for Recheck on anxiety and depression. Call back sooner if any problems.

## 2019-05-22 ENCOUNTER — Encounter: Payer: Self-pay | Admitting: Nurse Practitioner

## 2019-05-22 MED ORDER — CITALOPRAM HYDROBROMIDE 20 MG PO TABS: ORAL_TABLET | ORAL | 0 refills | Status: DC

## 2019-05-28 LAB — IGP, CTNG, RFX APTIMA HPV ASCU
Chlamydia, Nuc. Acid Amp: NEGATIVE
Gonococcus by Nucleic Acid Amp: NEGATIVE

## 2019-05-28 LAB — SPECIMEN STATUS REPORT

## 2019-06-02 ENCOUNTER — Ambulatory Visit (INDEPENDENT_AMBULATORY_CARE_PROVIDER_SITE_OTHER): Payer: Medicaid Other | Admitting: Family Medicine

## 2019-06-02 DIAGNOSIS — N3001 Acute cystitis with hematuria: Secondary | ICD-10-CM

## 2019-06-02 DIAGNOSIS — N390 Urinary tract infection, site not specified: Secondary | ICD-10-CM

## 2019-06-02 MED ORDER — LEVOFLOXACIN 500 MG PO TABS: 500.0000 mg | ORAL_TABLET | Freq: Every day | ORAL | 0 refills | Status: DC

## 2019-06-02 NOTE — Progress Notes (Signed)
   Subjective:    Patient ID: Jeanne Reed, female    DOB: 03-16-1991, 29 y.o.   MRN: [id number]  HPI  Patient calls for a follow up on ER visit Sunday for UTI. Patient was given 2 days of pyridium and Ceftin. Patient states she is still uncomfortable. ER record reviewed Patient had symptoms multiple days dysuria and lower pelvic pressure urinary frequency denies high fevers Virtual Visit via Video Note  I connected with Arman Filter Lacks on 06/02/19 at  2:00 PM EST by a video enabled telemedicine application and verified that I am speaking with the correct person using two identifiers.  Location: Patient: home Provider: office   I discussed the limitations of evaluation and management by telemedicine and the availability of in person appointments. The patient expressed understanding and agreed to proceed.  History of Present Illness:    Observations/Objective:   Assessment and Plan:   Follow Up Instructions:    I discussed the assessment and treatment plan with the patient. The patient was provided an opportunity to ask questions and all were answered. The patient agreed with the plan and demonstrated an understanding of the instructions.   The patient was advised to call back or seek an in-person evaluation if the symptoms worsen or if the condition fails to improve as anticipated.  I provided 16 minutes of non-face-to-face time during this encounter.      Review of Systems  Constitutional: Negative for activity change and appetite change.  HENT: Negative for congestion and rhinorrhea.   Respiratory: Negative for cough and shortness of breath.   Cardiovascular: Negative for chest pain and leg swelling.  Gastrointestinal: Negative for abdominal pain, nausea and vomiting.  Genitourinary: Positive for dysuria, frequency, hematuria and urgency.  Skin: Negative for color change.  Neurological: Negative for dizziness and weakness.  Psychiatric/Behavioral: Negative for  agitation and confusion.       Objective:   Physical Exam  Patient had virtual visit Appears to be in no distress Atraumatic Neuro able to relate and oriented No apparent resp distress Color normal       Assessment & Plan:  Urinary tract infection with hematuria now hematuria has resolved patient having ongoing trouble with the dysuria therefore switch over to Levaquin for 7 days recommend follow-up in 2 to 3 weeks to check a urine specimen  Frequent urinary tract infections because of this [facility] patient gets 2-3 UTIs per year ever since being a teenager.  Recommend ultrasound of the kidneys await the results

## 2019-06-15 ENCOUNTER — Encounter: Payer: Self-pay | Admitting: Family Medicine

## 2019-06-17 ENCOUNTER — Ambulatory Visit (HOSPITAL_COMMUNITY)
Admission: RE | Admit: 2019-06-17 | Discharge: 2019-06-17 | Disposition: A | Discharge status: Home / Self Care | Payer: Medicaid Other | Source: Ambulatory Visit | Attending: Family Medicine | Admitting: Family Medicine

## 2019-06-17 ENCOUNTER — Other Ambulatory Visit: Payer: Self-pay

## 2019-06-17 DIAGNOSIS — N390 Urinary tract infection, site not specified: Secondary | ICD-10-CM | POA: Insufficient documentation

## 2019-06-17 DIAGNOSIS — N281 Cyst of kidney, acquired: Secondary | ICD-10-CM | POA: Diagnosis not present

## 2019-06-18 ENCOUNTER — Encounter: Payer: Self-pay | Admitting: Nurse Practitioner

## 2019-06-18 ENCOUNTER — Ambulatory Visit: Payer: Medicaid Other | Admitting: Nurse Practitioner

## 2019-06-18 ENCOUNTER — Other Ambulatory Visit: Payer: Self-pay

## 2019-06-18 VITALS — BP 132/84 | Temp 97.7°F | Wt 154.2 lb

## 2019-06-18 DIAGNOSIS — M26622 Arthralgia of left temporomandibular joint: Secondary | ICD-10-CM | POA: Insufficient documentation

## 2019-06-18 DIAGNOSIS — F419 Anxiety disorder, unspecified: Secondary | ICD-10-CM | POA: Diagnosis not present

## 2019-06-18 MED ORDER — ESCITALOPRAM OXALATE 10 MG PO TABS: 10.0000 mg | ORAL_TABLET | Freq: Every day | ORAL | 2 refills | Status: DC

## 2019-06-18 NOTE — Progress Notes (Signed)
   Subjective:    Patient ID: Jeanne Reed, female    DOB: [birth year], 29 y.o.   MRN: [id number]  HPI Pt here for follow up on anxiety/depression. Pt states she liked the Celexa but is not taking it any longer. Pt states her sex drive was decreased and she felt numb.  States that patient had trouble with sex drive and orgasm, felt her private area was "numb".  Medication did seem to be working otherwise.  Has been off the medication for about 5 days and since then has noticed that she has been grinding her teeth and tightening her TMJ.  Denies any other adverse effects from Celexa. Has also made some positive changes in her life including starting a new job and buying a new house.  States her relationship with her boyfriend is going well.   Review of Systems     Objective:   Physical Exam NAD.  Alert, oriented.  Making good eye contact.  Cheerful affect.  Thoughts logical coherent and relevant.  Lungs clear.  Heart regular rate rhythm.  Tightness and tenderness noted along the left TMJ area, can open and close her mouth without difficulty, no popping or clicking noted.       Assessment & Plan:   Problem List Items Addressed This Visit      Other   Anxiety - Primary   Relevant Medications   escitalopram (LEXAPRO) 10 MG tablet   TMJ tenderness, left     Meds ordered this encounter  Medications  . escitalopram (LEXAPRO) 10 MG tablet    Sig: Take 1 tablet (10 mg total) by mouth daily.    Dispense:  30 tablet    Refill:  2    Order Specific Question:   Supervising Provider    Answer:   Lilyan Punt A [[id number]]   Trial of Lexapro as directed.  Again reviewed potential adverse effects.  DC med and contact office if any problems.  Encourage patient to continue positive lifestyle changes.  Massage and ice/heat applications to the left TMJ area.  Patient is still planning to conceive.  Recommend holding all muscle relaxants or other medications at this time and try other conservative  therapy. Return in about 1 month (around 07/19/2019). Call back sooner if needed.

## 2019-06-19 ENCOUNTER — Encounter: Payer: Self-pay | Admitting: Nurse Practitioner

## 2019-07-21 ENCOUNTER — Other Ambulatory Visit: Payer: Self-pay | Admitting: Family Medicine

## 2019-07-30 ENCOUNTER — Telehealth: Payer: Self-pay | Admitting: *Deleted

## 2019-07-30 ENCOUNTER — Telehealth (INDEPENDENT_AMBULATORY_CARE_PROVIDER_SITE_OTHER): Payer: Medicaid Other | Admitting: Nurse Practitioner

## 2019-07-30 ENCOUNTER — Encounter: Payer: Self-pay | Admitting: Nurse Practitioner

## 2019-07-30 ENCOUNTER — Other Ambulatory Visit: Payer: Self-pay

## 2019-07-30 DIAGNOSIS — F419 Anxiety disorder, unspecified: Secondary | ICD-10-CM

## 2019-07-30 MED ORDER — METRONIDAZOLE 0.75 % EX GEL: CUTANEOUS | 0 refills | Status: DC

## 2019-07-30 NOTE — Progress Notes (Signed)
   Subjective:    Patient ID: Jeanne Reed, female    DOB: 1990-12-17, 29 y.o.   MRN: [id number]  HPI  Patient calls for a follow up on anxiety. Patient states she has been doing good and has not had to take meds recently.  Virtual Visit via Video Note  I connected with Arman Filter Douse on 07/30/19 at  9:00 AM EDT by a video enabled telemedicine application and verified that I am speaking with the correct person using two identifiers.  Location: Patient: home Provider: office   I discussed the limitations of evaluation and management by telemedicine and the availability of in person appointments. The patient expressed understanding and agreed to proceed.  History of Present Illness: Presents to discuss her anxiety. Lexapro also was working well for anxiety but had sexual side effects. Has also tried Celexa. States she feels the same as her last visit. No suicidal or homicidal thoughts or ideation. Is due for her next cycle in about 12 days. Thinks she may be ovulating. Had vaginal sex with her partner recently and noticed an odor, same as last time. Requests refill on Metrogel. No fever or pelvic pain. Did notice some difficulty inserting the tube with some leftover Metrogel. Still planning pregnancy.    Observations/Objective: Today's visit was via telephone Physical exam was not possible for this visit Alert, oriented. Thoughts logical, coherent and relevant. Calm affect.   Assessment and Plan: Problem List Items Addressed This Visit      Other   Anxiety - Primary     Meds ordered this encounter  Medications  . metroNIDAZOLE (METROGEL) 0.75 % gel    Sig: Apply one applicator vaginally every night for 5 nights.    Dispense:  45 g    Refill:  0    Order Specific Question:   Supervising Provider    Answer:   Lilyan Punt A [id number]   Defers STD testing at this time. Recommend patient take OTC pregnancy test as a precaution; she agrees with this plan.  Will consult with OB/GYN  regarding other meds for anxiety. Warning signs reviewed regarding vaginal symptoms. Call office or go to ED if any problems.  Return in about 1 month (around 08/29/2019).   Follow Up Instructions:    I discussed the assessment and treatment plan with the patient. The patient was provided an opportunity to ask questions and all were answered. The patient agreed with the plan and demonstrated an understanding of the instructions.   The patient was advised to call back or seek an in-person evaluation if the symptoms worsen or if the condition fails to improve as anticipated.  I provided 15 minutes of non-face-to-face time during this encounter.      Review of Systems     Objective:   Physical Exam        Assessment & Plan:

## 2019-07-30 NOTE — Telephone Encounter (Signed)
Ms. leilanny, fluitt are scheduled for a virtual visit with your provider today.    Just as we do with appointments in the office, we must obtain your consent to participate.  Your consent will be active for this visit and any virtual visit you may have with one of our providers in the next 365 days.    If you have a MyChart account, I can also send a copy of this consent to you electronically.  All virtual visits are billed to your insurance company just like a traditional visit in the office.  As this is a virtual visit, video technology does not allow for your provider to perform a traditional examination.  This may limit your provider's ability to fully assess your condition.  If your provider identifies any concerns that need to be evaluated in person or the need to arrange testing such as labs, EKG, etc, we will make arrangements to do so.    Although advances in technology are sophisticated, we cannot ensure that it will always work on either your end or our end.  If the connection with a video visit is poor, we may have to switch to a telephone visit.  With either a video or telephone visit, we are not always able to ensure that we have a secure connection.   I need to obtain your verbal consent now.   Are you willing to proceed with your visit today?   Jennyfer Nickolson Radcliffe has provided verbal consent on 07/30/2019 for a virtual visit (video or telephone).   Kathleen Lime, RN 07/30/2019  8:48 AM

## 2019-08-03 ENCOUNTER — Encounter: Payer: Self-pay | Admitting: Family Medicine

## 2019-08-03 ENCOUNTER — Other Ambulatory Visit: Payer: Self-pay

## 2019-08-03 ENCOUNTER — Ambulatory Visit: Payer: Medicaid Other | Admitting: Family Medicine

## 2019-08-03 ENCOUNTER — Encounter: Payer: Self-pay | Admitting: Nurse Practitioner

## 2019-08-03 VITALS — BP 118/80 | HR 89 | Temp 98.3°F | Wt 154.0 lb

## 2019-08-03 DIAGNOSIS — Z3202 Encounter for pregnancy test, result negative: Secondary | ICD-10-CM | POA: Diagnosis not present

## 2019-08-03 DIAGNOSIS — R300 Dysuria: Secondary | ICD-10-CM | POA: Diagnosis not present

## 2019-08-03 LAB — POCT URINALYSIS DIPSTICK (MANUAL)
Nitrite, UA: NEGATIVE
Poct Blood: NEGATIVE
Poct Glucose: NORMAL mg/dL
Poct Ketones: NEGATIVE
Poct Protein: NEGATIVE mg/dL
Spec Grav, UA: <=1.005 — AB (ref 1.010–1.025)
pH, UA: 5 (ref 5.0–8.0)

## 2019-08-03 LAB — POCT URINE PREGNANCY: Preg Test, Ur: NEGATIVE

## 2019-08-03 NOTE — Progress Notes (Addendum)
Patient ID: Jeanne Reed, female    DOB: 1990/08/22, 29 y.o.   MRN: [id number]   Chief Complaint  Patient presents with  . Urinary Tract Infection    Vitals BP 118/80   Pulse 89   Temp 98.3 F (36.8 C)   Wt 154 lb (69.9 kg)   SpO2 98%   BMI 24.48 kg/m   Medical History Jeanne Reed has a past medical history of GERD (gastroesophageal reflux disease) and Nausea and vomiting during pregnancy (02/08/2015).   Outpatient Encounter Medications as of 08/03/2019  Medication Sig  . DEXILANT 60 MG capsule Take 1 capsule by mouth once daily  . valACYclovir (VALTREX) 1000 MG tablet Take 1 tablet by mouth twice daily  . albuterol (VENTOLIN HFA) 108 (90 Base) MCG/ACT inhaler Inhale 2 puffs into the lungs 4 (four) times daily as needed for wheezing or shortness of breath. (Patient not taking: Reported on 06/18/2019)  . ibuprofen (ADVIL,MOTRIN) 800 MG tablet Take 800 mg by mouth every 8 (eight) hours as needed for headache or moderate pain.   . [DISCONTINUED] escitalopram (LEXAPRO) 10 MG tablet Take 1 tablet (10 mg total) by mouth daily. (Patient not taking: Reported on 08/03/2019)  . [DISCONTINUED] metroNIDAZOLE (METROGEL) 0.75 % gel Apply one applicator vaginally every night for 5 nights.   No facility-administered encounter medications on file as of 08/03/2019.    Subjective:   HPI Pt seen for concern of "bladder spasms" and burning with urination.  Started last night.  Pt also trying to conceive and wanting to have a urine preg test.  LMP- 2 wks ago and was normal per pt. No vaginal dc or pain. No dark urine, hematuria, or frequency.   2/21- was seen in hospital for a "very bad Uti"  Had fever, back pain, and hematuria.   Pt stating she is prone to UTIs.  Does take cystex otc when having discomfort.  Denies back pain, fever, abd pain, or frequency/urgency.   Review of Systems  Constitutional: Negative for chills and fever.  HENT: Negative for congestion, rhinorrhea and sore throat.    Respiratory: Negative for cough, shortness of breath and wheezing.   Cardiovascular: Negative for chest pain and leg swelling.  Gastrointestinal: Negative for abdominal pain, diarrhea, nausea and vomiting.  Genitourinary: Positive for dysuria. Negative for difficulty urinating, flank pain, frequency, hematuria, pelvic pain, urgency, vaginal bleeding, vaginal discharge and vaginal pain.  Musculoskeletal: Negative for arthralgias and back pain.  Skin: Negative for rash.  Neurological: Negative for dizziness, weakness and headaches.     Objective:   Physical Exam Vitals and nursing note reviewed.  Constitutional:      General: She is not in acute distress.    Appearance: Normal appearance. She is not toxic-appearing.  HENT:     Head: Normocephalic and atraumatic.     Nose: No congestion or rhinorrhea.  Eyes:     Conjunctiva/sclera: Conjunctivae normal.  Cardiovascular:     Rate and Rhythm: Normal rate and regular rhythm.     Pulses: Normal pulses.     Heart sounds: Normal heart sounds.  Pulmonary:     Effort: Pulmonary effort is normal. No respiratory distress.     Breath sounds: Normal breath sounds. No wheezing, rhonchi or rales.  Abdominal:     General: Abdomen is flat.     Palpations: Abdomen is soft. There is no mass.     Tenderness: There is no abdominal tenderness. There is no guarding or rebound.  Musculoskeletal:  General: Normal range of motion.     Cervical back: Normal range of motion.     Right lower leg: No edema.     Left lower leg: No edema.  Lymphadenopathy:     Cervical: No cervical adenopathy.  Skin:    General: Skin is warm and dry.     Findings: No lesion or rash.  Neurological:     General: No focal deficit present.     Mental Status: She is alert and oriented to person, place, and time.  Psychiatric:        Mood and Affect: Mood normal.        Behavior: Behavior normal.      Assessment and Plan   1. Dysuria - POCT Urinalysis Dip  Manual  2. Pregnancy examination or test, negative result - POCT urine pregnancy   Negative- urine preg testing. UA- small LE all other normal. Micro- small WBCs, no rbc or bacteria.  advising lots of hydration and avoiding caffeine/alcohol/ citrus fruits.  Take azo otc if needed. ' Urine culture ordered.  Will call if needing abx.  Call if worsening pain, fever, or hematuria.   Pt in agreement.   F/u prn.  Results for orders placed or performed in visit on 08/03/19  POCT Urinalysis Dip Manual  Result Value Ref Range   Spec Grav, UA <=1.005 (A) 1.010 - 1.025   pH, UA 5.0 5.0 - 8.0   Leukocytes, UA Small (1+) (A) Negative   Nitrite, UA Negative Negative   Poct Protein Negative Negative, trace mg/dL   Poct Glucose Normal Normal mg/dL   Poct Ketones Negative Negative   Poct Urobilinogen     Poct Bilirubin     Poct Blood Negative Negative, trace  POCT urine pregnancy  Result Value Ref Range   Preg Test, Ur Negative Negative     Addendum- Urine culture grew out E.coli greater than 100,000 colonies.  Will send keflex for 7 days for treatment.  Dr. Ladona Ridgel

## 2019-08-05 LAB — URINE CULTURE

## 2019-08-05 MED ORDER — CEPHALEXIN 500 MG PO CAPS: 500.0000 mg | ORAL_CAPSULE | Freq: Two times a day (BID) | ORAL | 0 refills | Status: DC

## 2019-08-05 NOTE — Addendum Note (Signed)
Addended by: Annalee Genta on: 08/05/2019 08:15 AM   Modules accepted: Orders

## 2019-08-06 MED ORDER — NITROFURANTOIN MONOHYD MACRO 100 MG PO CAPS: 100.0000 mg | ORAL_CAPSULE | Freq: Two times a day (BID) | ORAL | 0 refills | Status: DC

## 2019-08-07 ENCOUNTER — Other Ambulatory Visit: Payer: Self-pay | Admitting: Nurse Practitioner

## 2019-08-07 MED ORDER — SERTRALINE HCL 50 MG PO TABS: ORAL_TABLET | ORAL | 0 refills | Status: DC

## 2019-08-17 ENCOUNTER — Encounter: Payer: Self-pay | Admitting: Urology

## 2019-08-17 ENCOUNTER — Ambulatory Visit (INDEPENDENT_AMBULATORY_CARE_PROVIDER_SITE_OTHER): Payer: Medicaid Other | Admitting: Urology

## 2019-08-17 ENCOUNTER — Other Ambulatory Visit: Payer: Self-pay

## 2019-08-17 VITALS — BP 91/64 | HR 79 | Temp 98.1°F | Ht 66.0 in | Wt 155.0 lb

## 2019-08-17 DIAGNOSIS — N390 Urinary tract infection, site not specified: Secondary | ICD-10-CM

## 2019-08-17 LAB — POCT URINALYSIS DIPSTICK
Bilirubin, UA: NEGATIVE
Glucose, UA: NEGATIVE
Ketones, UA: NEGATIVE
Nitrite, UA: NEGATIVE
Protein, UA: NEGATIVE
Spec Grav, UA: 1.02 (ref 1.010–1.025)
Urobilinogen, UA: 0.2 E.U./dL
pH, UA: 6 (ref 5.0–8.0)

## 2019-08-17 MED ORDER — SULFAMETHOXAZOLE-TRIMETHOPRIM 800-160 MG PO TABS: 1.0000 | ORAL_TABLET | Freq: Once | ORAL | 3 refills | Status: AC

## 2019-08-17 NOTE — Progress Notes (Signed)
H&P  Chief Complaint: Recurrent Cystitis  History of Present Illness:  5.4.2021: Jeanne Reed is a 29 y.o. year old female referred by Dr Wolfgang Phoenix for recurrent cystitis. She states that since the age of 59 she has had 1-2 infections year, having already had 2 infections this calender year.Her most recent UTI was one wk ago and reports that this last February she had a severe UTI w/ back pain, fever, and blood per urine alongside her usual urinary sx's (freq, dysuria) -- this prompted her to go to the ER and she was told to follow-up with our office. She had negative RUS per ER. She denies a hx of KS's. Her sx's typically resolve after starting on antibiotics. She believes that most often her UTI's occur 2-3 days following intercourse, though she does try to practice proper post-intercourse hygiene. At baseline, she does feel like she has difficulties fully emptying her bladder.   Past Medical History:  Diagnosis Date  . Acid reflux   . Anxiety   . Depression   . GERD (gastroesophageal reflux disease)   . Nausea and vomiting during pregnancy 02/08/2015    Past Surgical History:  Procedure Laterality Date  . BIOPSY  05/05/2018   Procedure: BIOPSY;  Surgeon: Danie Binder, MD;  [facility];  Service: Endoscopy;;  gastric esophagus  . ESOPHAGOGASTRODUODENOSCOPY (EGD) WITH PROPOFOL N/A 05/05/2018   Dysphagia due to uncontrolled GERD. Small hiatal hernia noted. Mild gastritis. Empiric dilatation.  Marland Kitchen SAVORY DILATION N/A 05/05/2018   Procedure: SAVORY DILATION;  Surgeon: Danie Binder, MD;  Location: [facility];  Service: Endoscopy;  Laterality: N/A;  . TONSILLECTOMY    . WISDOM TOOTH EXTRACTION      Home Medications:  (Not in a hospital admission)   Allergies:  Allergies  Allergen Reactions  . Celexa [Citalopram] Other (See Comments)    Sexual dysfunction    Family History  Problem Relation Age of Onset  . Hematuria Mother   . Hematuria Maternal Grandmother   .  Cancer Maternal Grandfather   . Diabetes Maternal Grandfather   . Cancer Paternal Grandmother   . Colon cancer Paternal Grandmother   . Emphysema Paternal Grandfather   . Colon polyps Neg Hx     Social History:  reports that she has never smoked. She has never used smokeless tobacco. She reports previous drug use. Drug: Marijuana. She reports that she does not drink alcohol.  ROS: Urological Symptom Review  Patient is experiencing the following symptoms: Frequent urination Hard to postpone urination Burning/pain with urination Get up at night to urinate Stream starts and stops Blood in urine Urinary tract infection Sexually transmitted disease Vaginal bleeding (female only) Painful intercourse  Currently on monthly   Review of Systems Gastrointestinal (upper)  : Indigestion/heartburn Gastrointestinal (lower) : Negative for lower GI symptoms Constitutional : Negative for symptoms Skin: Negative for skin symptoms Eyes: Negative for eye symptoms Ear/Nose/Throat : Negative for Ear/Nose/Throat symptoms Hematologic/Lymphatic: Negative for Hematologic/Lymphatic symptoms Cardiovascular : Negative for cardiovascular symptom sRespiratory : Negative for respiratory symptoms Endocrine: Negative for endocrine symptoms Musculoskeletal: Back pain Neurological: Negative for neurological symptoms Psychologic: Depression Anxiety    Physical Exam:  Vital signs in last 24 hours: Temp:  [98.1 F (36.7 C)] 98.1 F (36.7 C) (05/04 1000) Pulse Rate:  [79] 79 (05/04 1000) BP: (91)/(64) 91/64 (05/04 1000) Weight:  [155 lb (70.3 kg)] 155 lb (70.3 kg) (05/04 1000) General:  Alert and oriented, No acute distress HEENT: Normocephalic, atraumatic Neck: No  JVD or lymphadenopathy Cardiovascular: Regular rate  Lungs: Normal inspiratory/expiratory excursion Extremities: No edema Neurologic: Grossly intact  Laboratory Data:  Results for orders placed or performed in visit on  08/17/19 (from the past 24 hour(s))  POCT urinalysis dipstick     Status: Abnormal   Collection Time: 08/17/19 10:09 AM  Result Value Ref Range   Color, UA yellow    Clarity, UA     Glucose, UA Negative Negative   Bilirubin, UA neg    Ketones, UA neg    Spec Grav, UA 1.020 1.010 - 1.025   Blood, UA ++    pH, UA 6.0 5.0 - 8.0   Protein, UA Negative Negative   Urobilinogen, UA 0.2 0.2 or 1.0 E.U./dL   Nitrite, UA neg    Leukocytes, UA Moderate (2+) (A) Negative   Appearance clear    Odor     I have reviewed prior pt notes  I have reviewed notes from referring/previous physicians  I have reviewed urinalysis results  I have independently reviewed prior imaging   Radiologic Imaging: Renal U/S images reviewed   Impression/Assessment:  PV [place] indicates she is emptying well. I am fine with her taking antibiotics following intercourse to try to limit her risk for UTI's. She has had a negative RUS so I do not think we need to move forward with evaluation for possible stones or other renal pathology (kidneys seem to be functioning well).   Plan:  1. She is fine to take antibiotics following intercourse -- given rx for sulfa  2. Advised to start on daily probiotics.  3. Return PRN   4. Post coital voiding  Luella Cook 08/17/2019, 10:11 AM  Bertram Millard. Jayleene Glaeser MD

## 2019-08-20 ENCOUNTER — Telehealth: Payer: Medicaid Other | Admitting: Nurse Practitioner

## 2019-09-27 ENCOUNTER — Telehealth: Payer: Self-pay | Admitting: Adult Health

## 2019-09-27 NOTE — Telephone Encounter (Signed)
dexilant should be fine and tylenol is OK for headaches, eat often and stay hydrated.

## 2019-09-27 NOTE — Telephone Encounter (Signed)
Patient called stating that she just found out she is pregnant and scheduled an appointment with [place] for 6/21. Pt states that she is taking a medication Dexilant and would like to know if it is safe since she is pregnant. Please contact pt

## 2019-10-04 ENCOUNTER — Ambulatory Visit: Payer: Medicaid Other | Admitting: Adult Health

## 2019-10-04 ENCOUNTER — Encounter: Payer: Self-pay | Admitting: Adult Health

## 2019-10-04 VITALS — BP 132/83 | HR 105 | Ht 66.0 in | Wt 158.0 lb

## 2019-10-04 DIAGNOSIS — Z3A01 Less than 8 weeks gestation of pregnancy: Secondary | ICD-10-CM

## 2019-10-04 DIAGNOSIS — Z3201 Encounter for pregnancy test, result positive: Secondary | ICD-10-CM | POA: Insufficient documentation

## 2019-10-04 DIAGNOSIS — O3680X0 Pregnancy with inconclusive fetal viability, not applicable or unspecified: Secondary | ICD-10-CM | POA: Diagnosis not present

## 2019-10-04 DIAGNOSIS — O26851 Spotting complicating pregnancy, first trimester: Secondary | ICD-10-CM | POA: Insufficient documentation

## 2019-10-04 DIAGNOSIS — R110 Nausea: Secondary | ICD-10-CM | POA: Diagnosis not present

## 2019-10-04 LAB — POCT URINE PREGNANCY: Preg Test, Ur: POSITIVE — AB

## 2019-10-04 MED ORDER — DOXYLAMINE-PYRIDOXINE 10-10 MG PO TBEC: DELAYED_RELEASE_TABLET | ORAL | 3 refills | Status: DC

## 2019-10-04 NOTE — Progress Notes (Signed)
  Subjective:     Patient ID: Loney Loh, female   DOB: 11/04/1990, 29 y.o.   MRN: [id number]  HPI Jeanne Reed is a 29 year old white female,single, in for UPT, has missed a period and had +HPTs.She takes Dexilant for GERD.  She works at [employer]  PCP is [employer].  Review of Systems Has missed a period and had +HPT +nausea Some spotting since last night, was not after sex or hard BM Reviewed past medical,surgical, social and family history. Reviewed medications and allergies.     Objective:   Physical Exam BP 132/83 (BP Location: Left Arm, Patient Position: Sitting, Cuff Size: Normal)   Pulse (!) 105   Ht 5\' 6"  (1.676 m)   Wt 158 lb (71.7 kg)   LMP 08/12/2019   BMI 25.50 kg/m UPT +,about 7+4 weeks by LMP, with EDD 05/18/20.Skin warm and dry. Neck: mid line trachea, normal thyroid, good ROM, no lymphadenopathy noted. Lungs: clear to ausculation bilaterally. Cardiovascular: regular rate and rhythm. Abdomen is soft and non tender AA1 PHQ 9 score is 2, no SI    Assessment:     1. Positive pregnancy test Take OTC PNV  2. Less than [redacted] weeks gestation of pregnancy Note given for climbing ladders and no lifting over 25 lbs  3. Encounter to determine fetal viability of pregnancy, single or unspecified fetus Return in 1 week for dating 07/16/20  4. Nausea Will rx diclegis Meds ordered this encounter  Medications  . Doxylamine-Pyridoxine 10-10 MG TBEC    Sig: Take 2 at HS then 1 in am and 1 in afternoon, no more than 4 per day    Dispense:  120 tablet    Refill:  3    Order Specific Question:   Supervising Provider    Answer:   [place], LUTHER H [[id number]]    5. Spotting affecting pregnancy in first trimester Will check QHCG and progesterone level,pelvic rest    Plan:     Review handout by [facility]

## 2019-10-05 ENCOUNTER — Other Ambulatory Visit: Payer: Self-pay | Admitting: Adult Health

## 2019-10-05 ENCOUNTER — Telehealth: Payer: Self-pay | Admitting: *Deleted

## 2019-10-05 ENCOUNTER — Telehealth: Payer: Self-pay | Admitting: Adult Health

## 2019-10-05 DIAGNOSIS — Z3201 Encounter for pregnancy test, result positive: Secondary | ICD-10-CM

## 2019-10-05 DIAGNOSIS — O26851 Spotting complicating pregnancy, first trimester: Secondary | ICD-10-CM

## 2019-10-05 LAB — PROGESTERONE: Progesterone: 7.5 ng/mL

## 2019-10-05 LAB — BETA HCG QUANT (REF LAB): hCG Quant: 496 m[IU]/mL

## 2019-10-05 NOTE — Telephone Encounter (Signed)
Pt called and stated that she is having more bleeding this morning and is concerned that she is having a miscarriage. Advised that she would need to come have repeat hcg level drawn tomorrow. Pt agreeable.

## 2019-10-05 NOTE — Telephone Encounter (Signed)
Pt thinks she is having a miscarriage. Would like some clarification on blood work that was done this week.

## 2019-10-06 DIAGNOSIS — Z3201 Encounter for pregnancy test, result positive: Secondary | ICD-10-CM | POA: Diagnosis not present

## 2019-10-06 DIAGNOSIS — O26851 Spotting complicating pregnancy, first trimester: Secondary | ICD-10-CM | POA: Diagnosis not present

## 2019-10-07 ENCOUNTER — Other Ambulatory Visit: Payer: Self-pay | Admitting: Adult Health

## 2019-10-07 ENCOUNTER — Encounter (HOSPITAL_COMMUNITY): Payer: Self-pay

## 2019-10-07 ENCOUNTER — Emergency Department (HOSPITAL_COMMUNITY): Payer: Medicaid Other

## 2019-10-07 ENCOUNTER — Other Ambulatory Visit: Payer: Self-pay

## 2019-10-07 ENCOUNTER — Telehealth: Payer: Self-pay | Admitting: Adult Health

## 2019-10-07 ENCOUNTER — Emergency Department (HOSPITAL_COMMUNITY)
Admission: EM | Admit: 2019-10-07 | Discharge: 2019-10-07 | Disposition: A | Discharge status: Home / Self Care | Payer: Medicaid Other | Attending: Emergency Medicine | Admitting: Emergency Medicine

## 2019-10-07 DIAGNOSIS — Z3A01 Less than 8 weeks gestation of pregnancy: Secondary | ICD-10-CM | POA: Insufficient documentation

## 2019-10-07 DIAGNOSIS — O200 Threatened abortion: Secondary | ICD-10-CM | POA: Diagnosis not present

## 2019-10-07 DIAGNOSIS — Z3A08 8 weeks gestation of pregnancy: Secondary | ICD-10-CM | POA: Diagnosis not present

## 2019-10-07 DIAGNOSIS — N939 Abnormal uterine and vaginal bleeding, unspecified: Secondary | ICD-10-CM

## 2019-10-07 DIAGNOSIS — Z3A00 Weeks of gestation of pregnancy not specified: Secondary | ICD-10-CM | POA: Diagnosis not present

## 2019-10-07 DIAGNOSIS — O4690 Antepartum hemorrhage, unspecified, unspecified trimester: Secondary | ICD-10-CM

## 2019-10-07 DIAGNOSIS — O209 Hemorrhage in early pregnancy, unspecified: Secondary | ICD-10-CM | POA: Diagnosis not present

## 2019-10-07 DIAGNOSIS — O26851 Spotting complicating pregnancy, first trimester: Secondary | ICD-10-CM

## 2019-10-07 LAB — CBC WITH DIFFERENTIAL/PLATELET
Abs Immature Granulocytes: 0.01 10*3/uL (ref 0.00–0.07)
Basophils Absolute: 0 10*3/uL (ref 0.0–0.1)
Basophils Relative: 0 %
Eosinophils Absolute: 0.2 10*3/uL (ref 0.0–0.5)
Eosinophils Relative: 3 %
HCT: 45.1 % (ref 36.0–46.0)
Hemoglobin: 14.4 g/dL (ref 12.0–15.0)
Immature Granulocytes: 0 %
Lymphocytes Relative: 26 %
Lymphs Abs: 1.9 10*3/uL (ref 0.7–4.0)
MCH: 30.5 pg (ref 26.0–34.0)
MCHC: 31.9 g/dL (ref 30.0–36.0)
MCV: 95.6 fL (ref 80.0–100.0)
Monocytes Absolute: 0.6 10*3/uL (ref 0.1–1.0)
Monocytes Relative: 7 %
Neutro Abs: 4.7 10*3/uL (ref 1.7–7.7)
Neutrophils Relative %: 64 %
Platelets: 296 10*3/uL (ref 150–400)
RBC: 4.72 MIL/uL (ref 3.87–5.11)
RDW: 12.5 % (ref 11.5–15.5)
WBC: 7.5 10*3/uL (ref 4.0–10.5)
nRBC: 0 % (ref 0.0–0.2)

## 2019-10-07 LAB — BASIC METABOLIC PANEL
Anion gap: 7 (ref 5–15)
BUN: 15 mg/dL (ref 6–20)
CO2: 26 mmol/L (ref 22–32)
Calcium: 9.1 mg/dL (ref 8.9–10.3)
Chloride: 103 mmol/L (ref 98–111)
Creatinine, Ser: 0.85 mg/dL (ref 0.44–1.00)
GFR calc Af Amer: >60 mL/min (ref >60)
GFR calc non Af Amer: >60 mL/min (ref >60)
Glucose, Bld: 97 mg/dL (ref 70–99)
Potassium: 4.4 mmol/L (ref 3.5–5.1)
Sodium: 136 mmol/L (ref 135–145)

## 2019-10-07 LAB — BETA HCG QUANT (REF LAB): hCG Quant: 602 m[IU]/mL

## 2019-10-07 LAB — ABO/RH: ABO/RH(D): O POS

## 2019-10-07 LAB — HCG, QUANTITATIVE, PREGNANCY: hCG, Beta Chain, Quant, S: 833 m[IU]/mL — ABNORMAL HIGH (ref <5)

## 2019-10-07 MED ORDER — PROGESTERONE 200 MG PO CAPS
ORAL_CAPSULE | ORAL | 3 refills | Status: DC
Start: 2019-10-07 — End: 2020-02-28

## 2019-10-07 NOTE — ED Triage Notes (Signed)
Pt reports she has been bleeding for the last 5 days. Pt is approx [redacted] weeks pregnant. LMP 08/12/19. Pt reports cramping. Reports HCG levels had only gone up 106 pts

## 2019-10-07 NOTE — ED Provider Notes (Signed)
[facility] EMERGENCY DEPARTMENT Provider Note   CSN: [id number] Arrival date & time: 10/07/19  1436     History Chief Complaint  Patient presents with  . Vaginal Bleeding    Jeanne Reed is a 29 y.o. female.  HPI   This patient is a pleasant 29 year old female with a history of 1 full-term pregnancy age for, 1 prior abortion, this is her third pregnancy and she is at approximately 8 weeks based on last menstrual period.  She reports that over the last 5 days she has had rather consistent vaginal bleeding with some lower abdominal cramping.  Her hCGs have been measured at her doctor's office and over a period of 48 hours and went from around 480 to approximately 600, because of increased bleeding she was directed to come to the emergency department for evaluation.  She has no prior history of ectopic pregnancy, no fevers, chills, nausea, vomiting, diarrhea.  She has had a normal appetite, no dysuria.  No history of blood transfusions.  The symptoms have been present for 5 days, nothing makes it better or worse, no associated fevers  Past Medical History:  Diagnosis Date  . Acid reflux   . Anxiety   . Depression   . GERD (gastroesophageal reflux disease)   . Nausea and vomiting during pregnancy 02/08/2015    Patient Active Problem List   Diagnosis Date Noted  . Less than [redacted] weeks gestation of pregnancy 10/04/2019  . Positive pregnancy test 10/04/2019  . Encounter to determine fetal viability of pregnancy 10/04/2019  . Spotting affecting pregnancy in first trimester 10/04/2019  . Nausea 10/04/2019  . TMJ tenderness, left 06/18/2019  . Dyspepsia 07/15/2018  . Dysphagia 12/26/2017  . Anxiety 09/01/2017  . 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 GERD (gastroesophageal reflux disease) 01/27/2017  . Marijuana use 08/08/2015  . SCOLIOSIS-IDIOPATHIC 06/03/2007    Past Surgical History:  Procedure Laterality Date  . BIOPSY  05/05/2018   Procedure: BIOPSY;  Surgeon: Danie Binder, MD;  [facility];  Service: Endoscopy;;  gastric esophagus  . ESOPHAGOGASTRODUODENOSCOPY (EGD) WITH PROPOFOL N/A 05/05/2018   Dysphagia due to uncontrolled GERD. Small hiatal hernia noted. Mild gastritis. Empiric dilatation.  Marland Kitchen SAVORY DILATION N/A 05/05/2018   Procedure: SAVORY DILATION;  Surgeon: Danie Binder, MD;  [facility]: [facility];  Service: Endoscopy;  Laterality: N/A;  . TONSILLECTOMY    . WISDOM TOOTH EXTRACTION       OB History    Gravida  2   Para  1   Term  1   Preterm      AB      Living  1     SAB      TAB      Ectopic      Multiple  0   Live Births  1           Family History  Problem Relation Age of Onset  . Hematuria Mother   . Hematuria Maternal Grandmother   . Cancer Maternal Grandfather        lung then brain  . Diabetes Maternal Grandfather   . Cancer Paternal Grandmother        liver  . Colon cancer Paternal Grandmother   . Breast cancer Paternal Grandmother   . Emphysema Paternal Grandfather   . Colon polyps Neg Hx     Social History   Tobacco Use  . Smoking status: Never Smoker  . Smokeless tobacco: Never  Used  Substance Use Topics  . Alcohol use: No  . Drug use: Not Currently    Types: Marijuana    Comment: stop smoking marijuana 6 months ago    Home Medications Prior to Admission medications   Medication Sig Start Date End Date Taking? Authorizing Provider  DEXILANT 60 MG capsule Take 1 capsule by mouth once daily 04/23/19   Letta Median, PA-C  Doxylamine-Pyridoxine 10-10 MG TBEC Take 2 at HS then 1 in am and 1 in afternoon, no more than 4 per day 10/04/19   Adline Potter, NP  progesterone (PROMETRIUM) 200 MG capsule Place 1 in vagina at bedtime daily 10/07/19   Adline Potter, NP  valACYclovir (VALTREX) 1000 MG tablet Take 1 tablet by mouth twice daily 07/21/19   Babs Sciara, MD    Allergies    Celexa [citalopram]  Review of Systems   Review of Systems  All other systems  reviewed and are negative.   Physical Exam Updated Vital Signs BP 121/86 (BP Location: Right Arm)   Pulse 99   Temp 98.5 F (36.9 C) (Oral)   Resp 18   LMP 08/12/2019   SpO2 99%   Physical Exam Vitals and nursing note reviewed.  Constitutional:      General: She is not in acute distress.    Appearance: She is well-developed.  HENT:     Head: Normocephalic and atraumatic.     Mouth/Throat:     Pharynx: No oropharyngeal exudate.  Eyes:     General: No scleral icterus.       Right eye: No discharge.        Left eye: No discharge.     Conjunctiva/sclera: Conjunctivae normal.     Pupils: Pupils are equal, round, and reactive to light.  Neck:     Thyroid: No thyromegaly.     Vascular: No JVD.  Cardiovascular:     Rate and Rhythm: Normal rate and regular rhythm.     Heart sounds: Normal heart sounds. No murmur heard.  No friction rub. No gallop.   Pulmonary:     Effort: Pulmonary effort is normal. No respiratory distress.     Breath sounds: Normal breath sounds. No wheezing or rales.  Abdominal:     General: Bowel sounds are normal. There is no distension.     Palpations: Abdomen is soft. There is no mass.     Tenderness: There is no abdominal tenderness.     Comments: Minimal suprapubic discomfort with palpation, very soft abdomen  Musculoskeletal:        General: No tenderness. Normal range of motion.     Cervical back: Normal range of motion and neck supple.  Lymphadenopathy:     Cervical: No cervical adenopathy.  Skin:    General: Skin is warm and dry.     Findings: No erythema or rash.  Neurological:     Mental Status: She is alert.     Coordination: Coordination normal.  Psychiatric:        Behavior: Behavior normal.     ED Results / Procedures / Treatments   Labs (all labs ordered are listed, but only abnormal results are displayed) Labs Reviewed  HCG, QUANTITATIVE, PREGNANCY - Abnormal; Notable for the following components:      Result Value   hCG,  Beta Chain, Quant, S 833 (*)    All other components within normal limits  CBC WITH DIFFERENTIAL/PLATELET  BASIC METABOLIC PANEL  ABO/RH    EKG  None  Radiology US OB LESS THAN 14 WEEKS WITH OB TRANSVAGINAL  Result Date: 10/07/2019 CLINICAL DATA:  Vaginal bleeding.  Positive beta HCG EXAM: OBSTETRIC <14 WK [place] AND TRANSVAGINAL OB US TECHNIQUE: Both transabdominal and transvaginal ultrasound examinations were performed for complete evaluation of the gestation as well as the maternal uterus, adnexal regions, and pelvic cul-de-sac. Transvaginal technique was performed to assess early pregnancy. COMPARISON:  None. FINDINGS: Intrauterine gestational sac: Possible. Yolk sac:  Not Visualized. Embryo:  Not Visualized. Cardiac Activity: Not Visualized. Maternal uterus/adnexae: Anteverted uterus measuring 9.8 x 4.1 x 5.2 cm. Endometrium measures 2.0 cm in thickness. Rounded 0.4 cm cystic structure within the endometrial canal (series 1, image 34). No yolk sac or embryo is identified. The left ovary measures 2.3 x 1.8 x 2.3 cm and appears unremarkable. The right ovary measures 3.2 x 1.6 x 3.5 cm and appears unremarkable. No free fluid within the cul-de-sac. IMPRESSION: Possible early intrauterine gestational sac, but no yolk sac, fetal pole, or cardiac activity yet visualized. Recommend follow-up quantitative B-HCG levels and follow-up US in 14 days to assess viability. This recommendation follows SRU consensus guidelines: Diagnostic Criteria for Nonviable Pregnancy Early in the First Trimester. Malva Limes Med 2013; 701:7793-90. Electronically Signed   By: Duanne Guess D.O.   On: 10/07/2019 16:26    Procedures Procedures (including critical care time)  Medications Ordered in ED Medications - No data to display  ED Course  I have reviewed the triage vital signs and the nursing notes.  Pertinent labs & imaging results that were available during my care of the patient were reviewed by me and considered in  my medical decision making (see chart for details).    MDM Rules/Calculators/A&P                          Labs reviewed from prior visits, the patient will need a repeat hCG quantification as well as an ultrasound to rule out ectopic pregnancy, she is agreeable to the plan, has a nonsurgical abdomen, at this time the patient is hemodynamically stable and will be brought to a bed awaiting ultrasound.  hCG negative  Pt updated on [place] report, Stable for d/c - pt agreeable Hgb normal.  Final Clinical Impression(s) / ED Diagnoses Final diagnoses:  Vaginal bleeding  Vaginal bleeding in pregnancy  Threatened miscarriage      Eber Hong, MD 10/07/19 1658

## 2019-10-07 NOTE — Progress Notes (Signed)
Ck QHCG tomorrow 

## 2019-10-07 NOTE — Discharge Instructions (Addendum)
Please see your gynecologist within 48 hours for follow-up for a repeat hCG quant, based on the ultrasound it appears that you have a possible early pregnancy however this may not be a normal pregnancy and thus a repeat ultrasound in 2 weeks has been recommended.  Your family doctor can order this.  In the meantime I would not be surprised if you continue to have some cramping and vaginal bleeding, please stick to Tylenol for pain control, drink plenty of fluids, your hemoglobin level is normal.  Emergency department for severe pain or uncontrolled heavy bleeding

## 2019-10-07 NOTE — Progress Notes (Signed)
rx Prometrium for low progesterone level

## 2019-10-08 ENCOUNTER — Ambulatory Visit (INDEPENDENT_AMBULATORY_CARE_PROVIDER_SITE_OTHER): Payer: Medicaid Other | Admitting: Women's Health

## 2019-10-08 ENCOUNTER — Encounter: Payer: Self-pay | Admitting: Women's Health

## 2019-10-08 VITALS — BP 119/66 | HR 100 | Ht 66.0 in | Wt 158.0 lb

## 2019-10-08 DIAGNOSIS — O209 Hemorrhage in early pregnancy, unspecified: Secondary | ICD-10-CM

## 2019-10-08 DIAGNOSIS — Z3A08 8 weeks gestation of pregnancy: Secondary | ICD-10-CM

## 2019-10-08 NOTE — Progress Notes (Signed)
GYN VISIT Patient name: Jeanne Reed MRN [id number]  Date of birth: 1990/04/24 Chief Complaint:   Miscarriage (vaginal bleeding)  History of Present Illness:   Jeanne Reed is a 29 y.o. G37P1001 Caucasian female [redacted]w[redacted]d by LMP, being seen today for report of passing tissue last night, thinks she had a miscarriage. Went to ED yesterday for vaginal bleeding and cramping x 5d. HCG 833 (was 602 one day prior), [place]: round 0.4cm cystic structure w/in endometrial canal, no YS/embryo, bilateral ovaries normal- impression: possible early IUP, recommended f/u HCG and u/s in 14d. States she passed a large clump of tissue after she went home. Cramping subsided and bleeding lightened. Tried a few months for this pregnancy. Hasn't started pnv yet.  Depression screen [facility] 2/9 10/04/2019 05/21/2019 06/11/2017  Decreased Interest 0 3 0  Down, Depressed, Hopeless 0 2 0  PHQ - 2 Score 0 5 0  Altered sleeping 0 3 -  Tired, decreased energy 2 3 -  Change in appetite 0 0 -  Feeling bad or failure about yourself  0 2 -  Trouble concentrating 0 1 -  Moving slowly or fidgety/restless 0 0 -  Suicidal thoughts 0 0 -  PHQ-9 Score 2 14 -  Difficult doing work/chores - Somewhat difficult -    Patient's last menstrual period was 08/12/2019. The current method of family planning is none.  Review of Systems:   Pertinent items are noted in HPI Denies fever/chills, dizziness, headaches, visual disturbances, fatigue, shortness of breath, chest pain, abdominal pain, vomiting, abnormal vaginal discharge/itching/odor/irritation, problems with periods, bowel movements, urination, or intercourse unless otherwise stated above.  Pertinent History Reviewed:  Reviewed past medical,surgical, social, obstetrical and family history.  Reviewed problem list, medications and allergies. Physical Assessment:   Vitals:   10/08/19 1115  BP: 119/66  Pulse: 100  Weight: 158 lb (71.7 kg)  Height: 5\' 6"  (1.676 m)  Body mass index is 25.5  kg/m.       Physical Examination:   General appearance: alert, well appearing, and in no distress  Mental status: alert, oriented to person, place, and time  Skin: warm & dry   Cardiovascular: normal heart rate noted  Respiratory: normal respiratory effort, no distress  Abdomen: soft, non-tender   Pelvic: VULVA: normal appearing vulva with no masses, tenderness or lesions, VAGINA: normal appearing vagina with normal color and discharge, no lesions, small amt blood in vault CERVIX: normal appearing cervix without discharge or lesions  Extremities: no edema   Chaperone: Rash    Results for orders placed or performed during the hospital encounter of 10/07/19 (from the past 24 hour(s))  hCG, quantitative, pregnancy   Collection Time: 10/07/19  3:46 PM  Result Value Ref Range   hCG, Beta Chain, Quant, S 833 (H) <5 mIU/mL  CBC with Differential/Platelet   Collection Time: 10/07/19  3:46 PM  Result Value Ref Range   WBC 7.5 4.0 - 10.5 K/uL   RBC 4.72 3.87 - 5.11 MIL/uL   Hemoglobin 14.4 12.0 - 15.0 g/dL   HCT 10/09/19 36 - 46 %   MCV 95.6 80.0 - 100.0 fL   MCH 30.5 26.0 - 34.0 pg   MCHC 31.9 30.0 - 36.0 g/dL   RDW 01.7 49.4 - 49.6 %   Platelets 296 150 - 400 K/uL   nRBC 0.0 0.0 - 0.2 %   Neutrophils Relative % 64 %   Neutro Abs 4.7 1.7 - 7.7 K/uL   Lymphocytes Relative 26 %  Lymphs Abs 1.9 0.7 - 4.0 K/uL   Monocytes Relative 7 %   Monocytes Absolute 0.6 0 - 1 K/uL   Eosinophils Relative 3 %   Eosinophils Absolute 0.2 0 - 0 K/uL   Basophils Relative 0 %   Basophils Absolute 0.0 0 - 0 K/uL   Immature Granulocytes 0 %   Abs Immature Granulocytes 0.01 0.00 - 0.07 K/uL  Basic metabolic panel   Collection Time: 10/07/19  3:46 PM  Result Value Ref Range   Sodium 136 135 - 145 mmol/L   Potassium 4.4 3.5 - 5.1 mmol/L   Chloride 103 98 - 111 mmol/L   CO2 26 22 - 32 mmol/L   Glucose, Bld 97 70 - 99 mg/dL   BUN 15 6 - 20 mg/dL   Creatinine, Ser 0.85 0.44 - 1.00 mg/dL   Calcium  9.1 8.9 - 10.3 mg/dL   GFR calc non Af Amer >60 >60 mL/min   GFR calc Af Amer >60 >60 mL/min   Anion gap 7 5 - 15  ABO/Rh   Collection Time: 10/07/19  3:54 PM  Result Value Ref Range   ABO/RH(D)      O POS Performed at [facility], [address] [place], [place] [address]     Assessment & Plan:  1) Likely completed SAB> will check HCG on Monday (last checked yesterday), Rh+. Start pnv as she wants to try again as soon as possible for pregnancy. Will follow hcg until <5, then ok to try again.   Meds: No orders of the defined types were placed in this encounter.   Orders Placed This Encounter  Procedures  . Beta hCG quant (ref lab)    Return for will call her w/ results.  [place], [facility] 10/08/2019 11:48 AM

## 2019-10-08 NOTE — Patient Instructions (Signed)

## 2019-10-11 DIAGNOSIS — O209 Hemorrhage in early pregnancy, unspecified: Secondary | ICD-10-CM | POA: Diagnosis not present

## 2019-10-12 ENCOUNTER — Other Ambulatory Visit: Payer: Self-pay | Admitting: Women's Health

## 2019-10-12 DIAGNOSIS — O039 Complete or unspecified spontaneous abortion without complication: Secondary | ICD-10-CM

## 2019-10-12 LAB — BETA HCG QUANT (REF LAB): hCG Quant: 42 m[IU]/mL

## 2019-10-19 ENCOUNTER — Other Ambulatory Visit: Payer: Self-pay

## 2019-10-19 ENCOUNTER — Ambulatory Visit (INDEPENDENT_AMBULATORY_CARE_PROVIDER_SITE_OTHER): Payer: Medicaid Other | Admitting: Family Medicine

## 2019-10-19 ENCOUNTER — Encounter: Payer: Self-pay | Admitting: Family Medicine

## 2019-10-19 DIAGNOSIS — R05 Cough: Secondary | ICD-10-CM | POA: Diagnosis not present

## 2019-10-19 DIAGNOSIS — R059 Cough, unspecified: Secondary | ICD-10-CM

## 2019-10-19 NOTE — Progress Notes (Signed)
   Subjective:    Patient ID: Jeanne Reed, female    DOB: 1990/06/21, 29 y.o.   MRN: [id number]  Cough This is a new problem. Episode onset: 1 week  The cough is productive of sputum. Associated symptoms include ear congestion, nasal congestion, postnasal drip and rhinorrhea. Pertinent negatives include no chest pain, ear pain, fever, shortness of breath or wheezing. Treatments tried: advil cold and sinus. The treatment provided no relief.    PMH benign  Review of Systems  Constitutional: Negative for activity change and fever.  HENT: Positive for congestion, postnasal drip and rhinorrhea. Negative for ear pain.   Eyes: Negative for discharge.  Respiratory: Positive for cough. Negative for shortness of breath and wheezing.   Cardiovascular: Negative for chest pain.       Objective:   Physical Exam Vitals and nursing note reviewed.  Constitutional:      Appearance: She is well-developed.  HENT:     Head: Normocephalic.     Nose: Nose normal.     Mouth/Throat:     Pharynx: No oropharyngeal exudate.  Cardiovascular:     Rate and Rhythm: Normal rate.     Heart sounds: Normal heart sounds. No murmur heard.   Pulmonary:     Effort: Pulmonary effort is normal.     Breath sounds: Normal breath sounds. No wheezing.  Musculoskeletal:     Cervical back: Neck supple.  Lymphadenopathy:     Cervical: No cervical adenopathy.  Skin:    General: Skin is warm and dry.           Assessment & Plan:  Viral process Possible Covid Covid testing Supportive measures Follow-up if progressive troubles or problems

## 2019-10-20 ENCOUNTER — Other Ambulatory Visit: Payer: Medicaid Other

## 2019-10-20 LAB — NOVEL CORONAVIRUS, NAA: SARS-CoV-2, NAA: NOT DETECTED

## 2019-10-20 LAB — SARS-COV-2, NAA 2 DAY TAT

## 2019-10-26 DIAGNOSIS — O039 Complete or unspecified spontaneous abortion without complication: Secondary | ICD-10-CM | POA: Diagnosis not present

## 2019-10-27 LAB — BETA HCG QUANT (REF LAB): hCG Quant: <1 m[IU]/mL

## 2019-10-28 IMAGING — NM NM BONE WHOLE BODY
2 series · 2 of 2 positions shown · non-contrast
Comparison: Lumbar spine CT 09/30/2017

CLINICAL DATA: 27-year-old female status post L2 pedicle fractures
earlier this year felt due to lifting a heavy box.

EXAM:
NUCLEAR MEDICINE WHOLE BODY BONE SCAN
TECHNIQUE: Whole body anterior and posterior images were obtained approximately
3 hours after intravenous injection of radiopharmaceutical.
RADIOPHARMACEUTICALS:  Twenty-one mCi Sechnetium-YYm MDP IV

[Series 1: whole body · 2.66mm/px · 1 of 1 slices shown (1 of 2)]
[im 1/1]
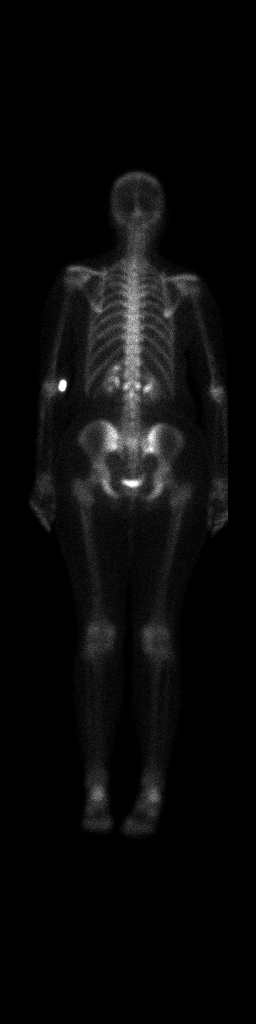

[Series 1: whole body · 2.66mm/px · 1 of 1 slices shown (2 of 2)]
[im 1/1]
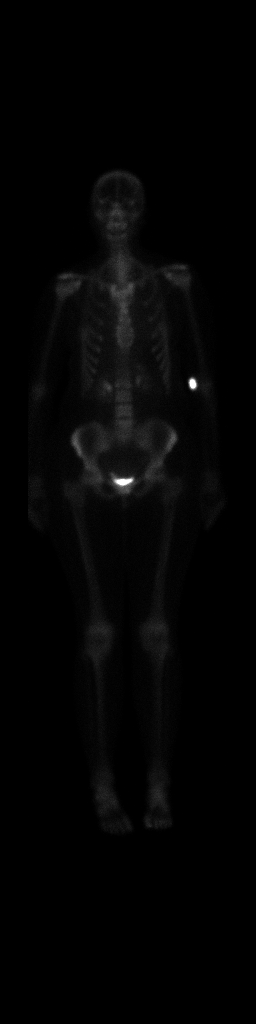

[2 of 2 positions shown; findings below may reference images not displayed]

FINDINGS: Expected radiotracer activity in both kidneys and the urinary
bladder.

Subtle increased radiotracer activity on the posterior images at L2
corresponding to the pedicles (arrow). Otherwise homogeneous
radiotracer activity throughout the spine.

Elsewhere homogeneous radiotracer activity throughout the axial and
appendicular skeleton.
IMPRESSION: 1. Subtle increased radiotracer activity at both L2 pedicles may
indicate incomplete healing of the fractures demonstrated by CT in
[REDACTED]. Repeat noncontrast CT (could be limited to the T12 through L2
levels) or alternatively noncontrast MRI may be valuable to evaluate
healing.
2. Otherwise normal bone scan.

## 2019-10-30 ENCOUNTER — Other Ambulatory Visit: Payer: Self-pay | Admitting: Gastroenterology

## 2019-10-31 NOTE — Telephone Encounter (Signed)
Please let patient know I am refilling her Dexilant Rx. I am not sure if patient is trying to get pregnant again (looks like she recently had a spontaneous abortion). If she is trying to get pregnant, please let her know I recommend she discuss treatment of GERD with her OBGYN.

## 2019-11-01 ENCOUNTER — Telehealth: Payer: Self-pay | Admitting: Adult Health

## 2019-11-01 NOTE — Telephone Encounter (Signed)
Pt had recent sab. She was recently started on Dexilant. She wants to make sure that this wouldn't cause another miscarriage.

## 2019-11-01 NOTE — Telephone Encounter (Signed)
Noted  

## 2019-11-01 NOTE — Telephone Encounter (Signed)
Patient states that she had an appointment with her gastro doctor and prescribed medication dexilant and wants pt to confirm with ob that this will not cause miscarriage.

## 2019-11-14 IMAGING — CT CT L SPINE W/O CM
3 of 4 series · 12 of 33 positions shown, 14 images · non-contrast
Comparison: None.

CLINICAL DATA: Back pain. Patient picked up something in [DATE] pedicles.

EXAM:
CT LUMBAR SPINE WITHOUT CONTRAST
TECHNIQUE: Multidetector CT imaging of the lumbar spine was performed without
intravenous contrast administration. Multiplanar CT image
reconstructions were also generated.

[Series 3: l-spine 2.00 br40 s3 lspine st · axial · 0.32mm/px · z∈[+1406,+1572]mm · 4 of 125 slices shown, 5 images]
[im 21/125  soft-tissue]
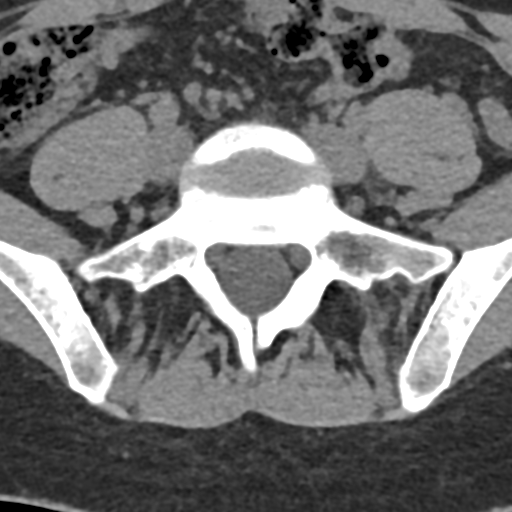
[im 21/125  bone]
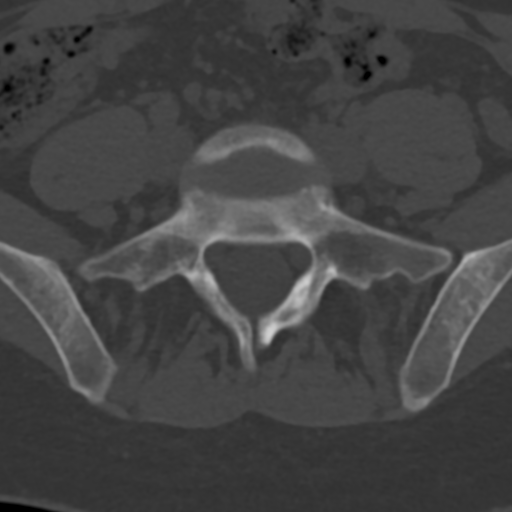
[im 42/125  bone]
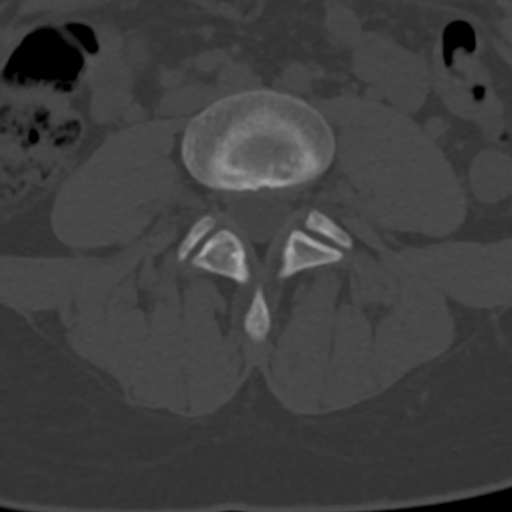
[im 83/125  bone]
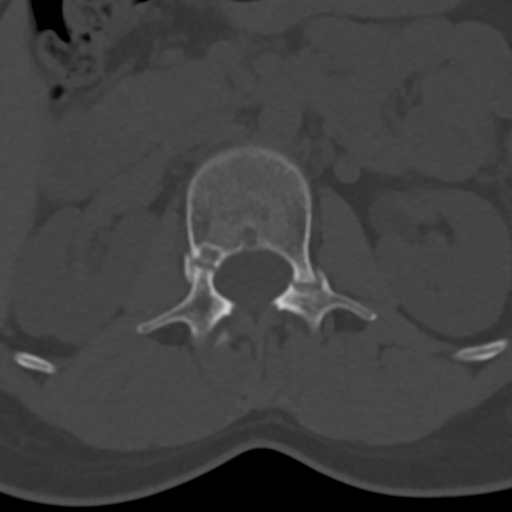
[im 104/125  bone]
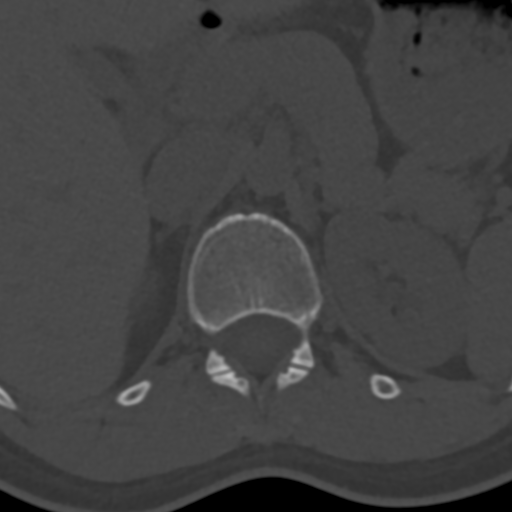

[Series 5: l-spine 2.00 br60 s3 sag sag bone · sagittal · 0.32mm/px · 5 of 81 slices shown, 6 images]
[im 27/81  bone]
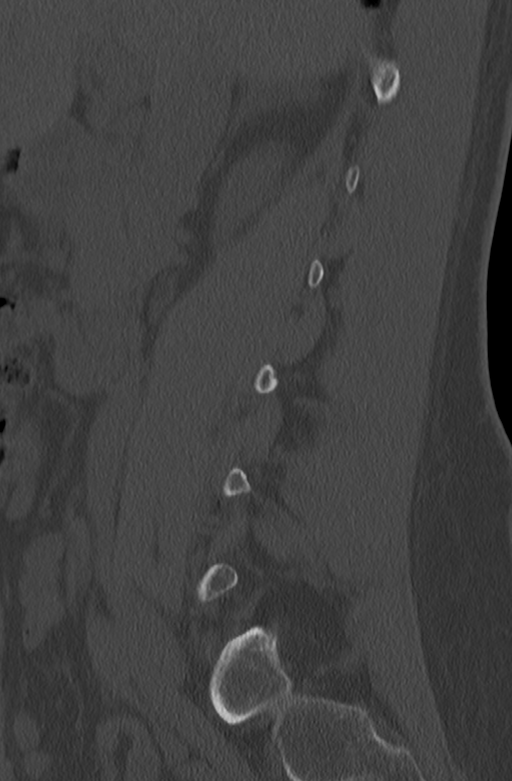
[im 34/81  bone]
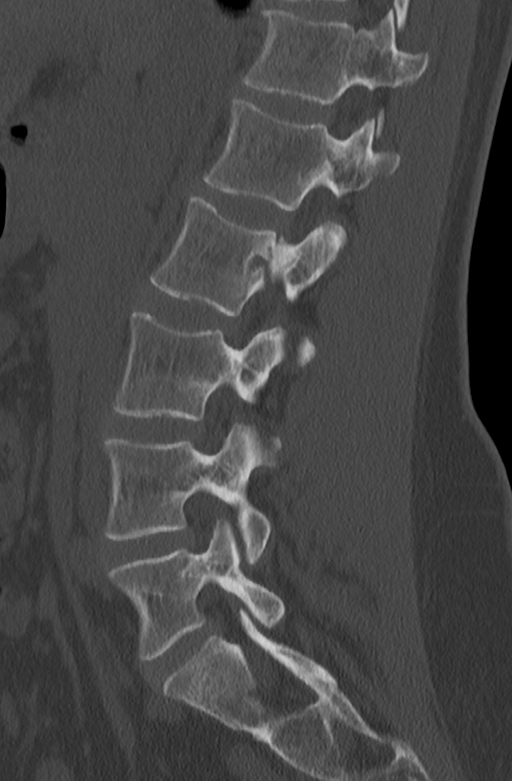
[im 41/81  soft-tissue]
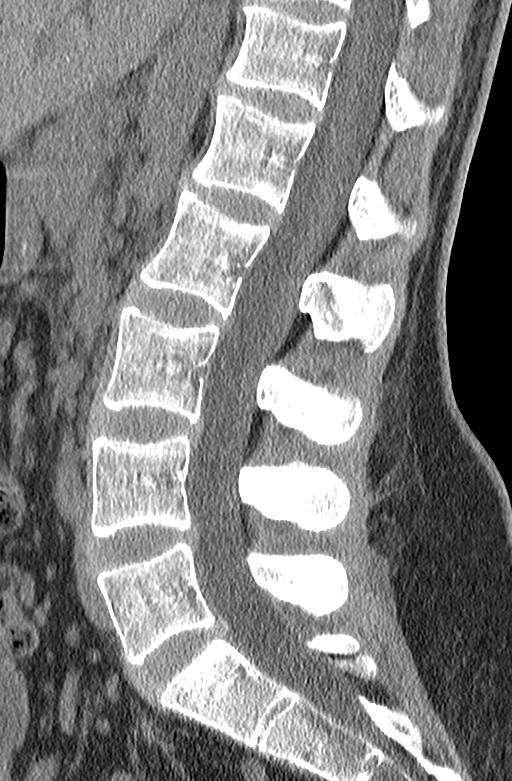
[im 41/81  bone]
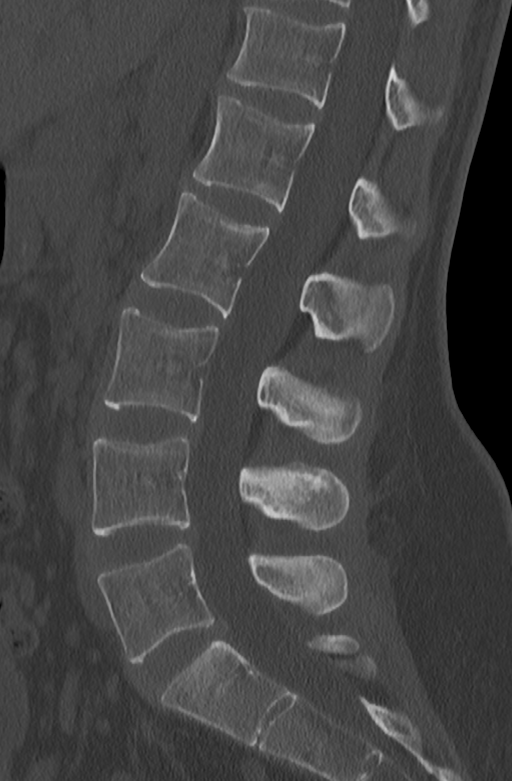
[im 47/81  bone]
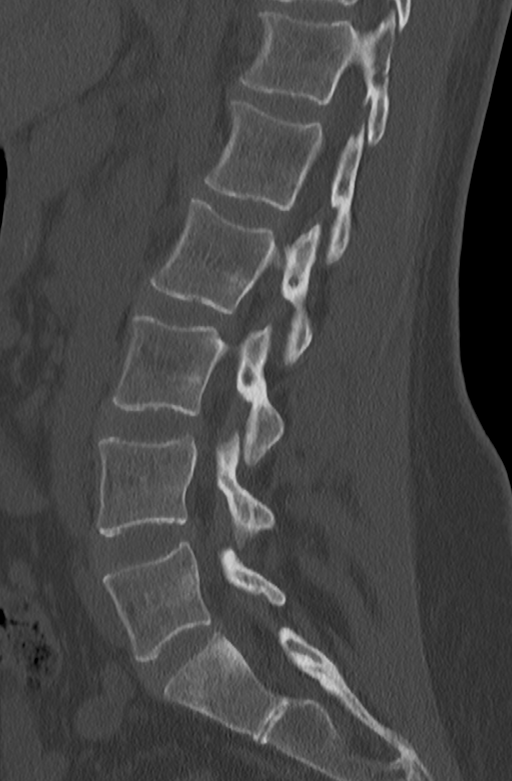
[im 54/81  bone]
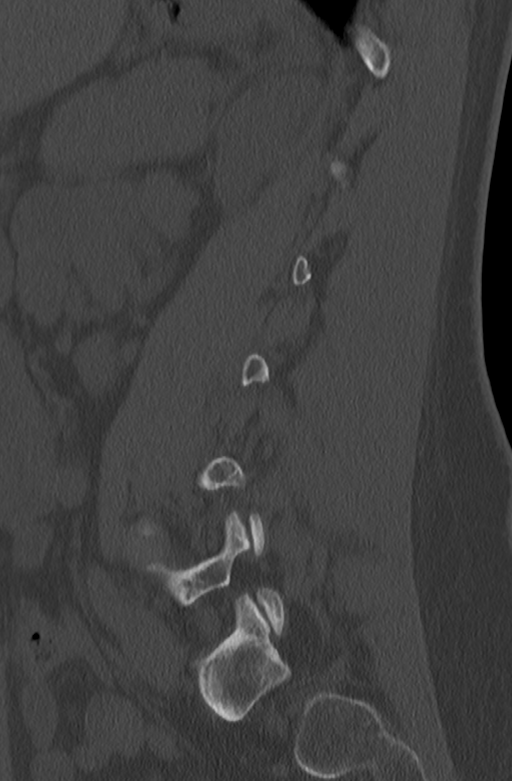

[Series 7: l-spine 2.00 br60 s3 cor cor bone · coronal · 0.32mm/px · 3 of 81 slices shown]
[im 17/81  bone]
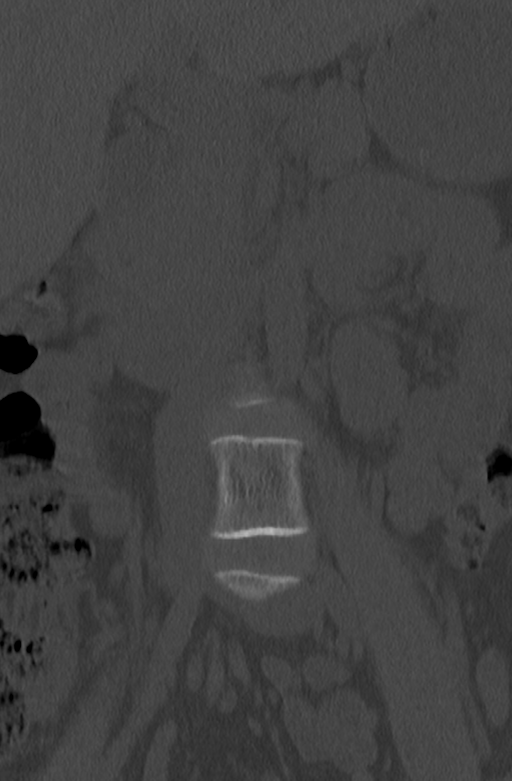
[im 33/81  bone]
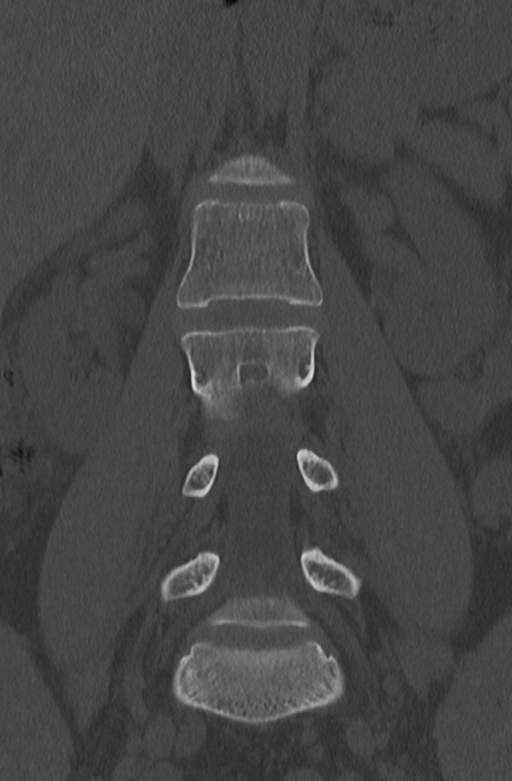
[im 49/81  bone]
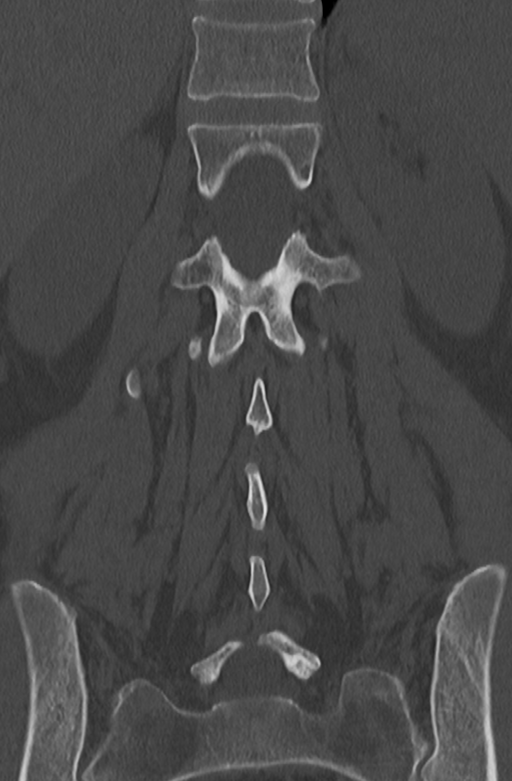

[12 of 33 positions shown; findings below may reference images not displayed]

FINDINGS: Segmentation: Normal lumbar segmentation. Unfused transverse process
ossifications centers bilaterally at L1.

Alignment: Normal vertebral height and alignment. Maintained lumbar
lordosis.

Vertebrae: Redemonstration of ununited bilateral L2 pedicle
fractures without significant bony bridging. 7 mm cystic focus
involving the posterior right L2 vertebral body adjacent to the
fracture is seen, new since prior.

No acute fracture or suspicious osseous lesions. Spina bifida
occulta at S1 a normal variant is noted. Intact bony sacrum and SI
joints.

Paraspinal and other soft tissues: Negative. No retroperitoneal
hemorrhage mass or adenopathy. Nonaneurysmal thoracic aorta. The
included kidneys are nonacute.

Disc levels: No central foraminal stenosis the lumbar spine. Mild
broad-based central disc bulges L3 through S1.
IMPRESSION: 1. Redemonstration of ununited bilateral L2 pedicle fractures
without significant bony bridging.
2. New 7 mm cystic focus involving the posterior right L2 vertebral
body adjacent to the right L2 pedicle fracture. This likely
represents posttraumatic change.

## 2019-11-24 ENCOUNTER — Ambulatory Visit (INDEPENDENT_AMBULATORY_CARE_PROVIDER_SITE_OTHER): Payer: Medicaid Other | Admitting: *Deleted

## 2019-11-24 VITALS — BP 125/71 | HR 107 | Wt 160.0 lb

## 2019-11-24 DIAGNOSIS — Z8759 Personal history of other complications of pregnancy, childbirth and the puerperium: Secondary | ICD-10-CM | POA: Diagnosis not present

## 2019-11-24 DIAGNOSIS — R29898 Other symptoms and signs involving the musculoskeletal system: Secondary | ICD-10-CM

## 2019-11-24 DIAGNOSIS — Z3201 Encounter for pregnancy test, result positive: Secondary | ICD-10-CM

## 2019-11-24 DIAGNOSIS — Z3490 Encounter for supervision of normal pregnancy, unspecified, unspecified trimester: Secondary | ICD-10-CM

## 2019-11-24 LAB — POCT URINE PREGNANCY: Preg Test, Ur: POSITIVE — AB

## 2019-11-24 NOTE — Progress Notes (Signed)
Chart reviewed for nurse visit. Agree with plan of care.  Adline Potter, NP 11/24/2019 3:41 PM

## 2019-11-24 NOTE — Progress Notes (Signed)
   NURSE VISIT- PREGNANCY CONFIRMATION   SUBJECTIVE:  Jeanne Reed is a 29 y.o. G2P1001 female at [redacted]w[redacted]d by uncertain LMP. Here for pregnancy confirmation.  Home pregnancy test: positive x 1  She reports nausea.  She is taking prenatal vitamins.    OBJECTIVE:  BP 125/71 (BP Location: Left Arm, Patient Position: Sitting, Cuff Size: Normal)   Pulse (!) 107   Wt 160 lb (72.6 kg)   LMP  (LMP Unknown)   BMI 25.82 kg/m   Appears well, in no apparent distress OB History  Gravida Para Term Preterm AB Living  2 1 1     1   SAB TAB Ectopic Multiple Live Births        0 1    # Outcome Date GA Lbr Len/2nd Weight Sex Delivery Anes PTL Lv  2 Current           1 Term 09/30/15 [redacted]w[redacted]d 20:59 / 02:46 7 lb 6 oz (3.345 kg) F Vag-Spont EPI  LIV    Results for orders placed or performed in visit on 11/24/19 (from the past 24 hour(s))  POCT urine pregnancy   Collection Time: 11/24/19  3:07 PM  Result Value Ref Range   Preg Test, Ur Positive (A) Negative    ASSESSMENT: Positive Pregnancy Test, unknown LMP    PLAN: Schedule for dating ultrasound in TBD. Discussed with JAG and will order HCG and Progesterone levels.   Prenatal vitamins: continue   Nausea medicines: not currently needed   OB packet given: No  01/24/20  11/24/2019 3:08 PM

## 2019-11-25 LAB — PROGESTERONE: Progesterone: 27.3 ng/mL

## 2019-11-25 LAB — BETA HCG QUANT (REF LAB): hCG Quant: 9097 m[IU]/mL

## 2019-12-02 ENCOUNTER — Telehealth: Payer: Self-pay | Admitting: Women's Health

## 2019-12-02 ENCOUNTER — Other Ambulatory Visit (INDEPENDENT_AMBULATORY_CARE_PROVIDER_SITE_OTHER): Payer: Medicaid Other

## 2019-12-02 ENCOUNTER — Telehealth: Payer: Self-pay | Admitting: *Deleted

## 2019-12-02 ENCOUNTER — Other Ambulatory Visit: Payer: Self-pay

## 2019-12-02 DIAGNOSIS — O3680X0 Pregnancy with inconclusive fetal viability, not applicable or unspecified: Secondary | ICD-10-CM | POA: Diagnosis not present

## 2019-12-02 DIAGNOSIS — Z3A01 Less than 8 weeks gestation of pregnancy: Secondary | ICD-10-CM

## 2019-12-02 NOTE — Telephone Encounter (Signed)
Pt would like a nurse to give her a call in ref to pain she is having on the left side she is concerned had missed ab 8 weeks ago

## 2019-12-02 NOTE — Telephone Encounter (Signed)
Patient states she is having very mild intermittent left lower quad pain.  Denies bleeding, N/V.  Has not had a dating u/s. Will get in tomorrow for u/s.

## 2019-12-02 NOTE — Progress Notes (Signed)
[place] 6+4 wks,single IUP with YS,FHR 119 bpm,crl 7.66 mm,normal ovaries

## 2019-12-03 ENCOUNTER — Other Ambulatory Visit: Payer: Medicaid Other

## 2020-01-07 ENCOUNTER — Other Ambulatory Visit: Payer: Self-pay | Admitting: Obstetrics & Gynecology

## 2020-01-07 DIAGNOSIS — Z3682 Encounter for antenatal screening for nuchal translucency: Secondary | ICD-10-CM

## 2020-01-13 ENCOUNTER — Encounter: Payer: Self-pay | Admitting: Women's Health

## 2020-01-13 ENCOUNTER — Other Ambulatory Visit: Payer: Medicaid Other

## 2020-01-13 ENCOUNTER — Ambulatory Visit (INDEPENDENT_AMBULATORY_CARE_PROVIDER_SITE_OTHER): Payer: Medicaid Other | Admitting: Women's Health

## 2020-01-13 ENCOUNTER — Ambulatory Visit: Payer: Medicaid Other | Admitting: *Deleted

## 2020-01-13 VITALS — BP 108/74 | HR 99 | Wt 162.0 lb

## 2020-01-13 DIAGNOSIS — Z3A12 12 weeks gestation of pregnancy: Secondary | ICD-10-CM

## 2020-01-13 DIAGNOSIS — Z363 Encounter for antenatal screening for malformations: Secondary | ICD-10-CM

## 2020-01-13 DIAGNOSIS — Z1389 Encounter for screening for other disorder: Secondary | ICD-10-CM

## 2020-01-13 DIAGNOSIS — Z3481 Encounter for supervision of other normal pregnancy, first trimester: Secondary | ICD-10-CM

## 2020-01-13 DIAGNOSIS — Z331 Pregnant state, incidental: Secondary | ICD-10-CM

## 2020-01-13 DIAGNOSIS — Z31430 Encounter of female for testing for genetic disease carrier status for procreative management: Secondary | ICD-10-CM | POA: Diagnosis not present

## 2020-01-13 DIAGNOSIS — Z3490 Encounter for supervision of normal pregnancy, unspecified, unspecified trimester: Secondary | ICD-10-CM | POA: Insufficient documentation

## 2020-01-13 DIAGNOSIS — Z3480 Encounter for supervision of other normal pregnancy, unspecified trimester: Secondary | ICD-10-CM

## 2020-01-13 LAB — POCT URINALYSIS DIPSTICK OB
Blood, UA: NEGATIVE
Glucose, UA: NEGATIVE
Ketones, UA: NEGATIVE
Leukocytes, UA: NEGATIVE
Nitrite, UA: NEGATIVE
POC,PROTEIN,UA: NEGATIVE

## 2020-01-13 MED ORDER — BLOOD PRESSURE MONITOR MISC: 0 refills | Status: DC

## 2020-01-13 NOTE — Progress Notes (Signed)
   NURSE VISIT- [facility]  SUBJECTIVE:  Jeanne Reed is a 29 y.o. G54P1001 female here for [facility] NIPT and [facility] Carrier Screening . She is [redacted]w[redacted]d pregnant.   OBJECTIVE:  Appears well, in no apparent distress  Blood work drawn from left [facility] without difficulty. 1 attempt(s).   ASSESSMENT: Pregnancy [redacted]w[redacted]d [facility] NIPT and Horizon Carrier Screening  PLAN: [facility] portal information given and instructed patient how to access results   Jobe Marker  01/13/2020 2:28 PM

## 2020-01-13 NOTE — Progress Notes (Signed)
INITIAL OBSTETRICAL VISIT Patient name: Jeanne Reed MRN [id number]  Date of birth: 1991-03-07 Chief Complaint:   Initial Prenatal Visit  History of Present Illness:   Jeanne Reed is a 29 y.o. G58P1001 Caucasian female at [redacted]w[redacted]d by [place] at 6 weeks with an Estimated Date of Delivery: 07/23/20 being seen today for her initial obstetrical visit.  Had SAB at end of June at 8wks, HCG 42 on 6/28 and <1 on 7/13. Conceived on 7/17!  Her obstetrical history is significant for term uncomplicated SVB x 1, recent SAB. Low progesterone early this pregnancy, so on progesterone suppositories.   Today she reports heartburn/indigestion, back pain.  Depression screen [facility] 2/9 01/13/2020 10/04/2019 05/21/2019 06/11/2017  Decreased Interest 1 0 3 0  Down, Depressed, Hopeless 0 0 2 0  PHQ - 2 Score 1 0 5 0  Altered sleeping 0 0 3 -  Tired, decreased energy 1 2 3  -  Change in appetite 0 0 0 -  Feeling bad or failure about yourself  0 0 2 -  Trouble concentrating 0 0 1 -  Moving slowly or fidgety/restless 0 0 0 -  Suicidal thoughts 0 0 0 -  PHQ-9 Score 2 2 14  -  Difficult doing work/chores - - Somewhat difficult -    No LMP recorded (lmp unknown). Patient is pregnant. Last pap 05/21/19. Results were: normal Review of Systems:   Pertinent items are noted in HPI Denies cramping/contractions, leakage of fluid, vaginal bleeding, abnormal vaginal discharge w/ itching/odor/irritation, headaches, visual changes, shortness of breath, chest pain, abdominal pain, severe nausea/vomiting, or problems with urination or bowel movements unless otherwise stated above.  Pertinent History Reviewed:  Reviewed past medical,surgical, social, obstetrical and family history.  Reviewed problem list, medications and allergies. OB History  Gravida Para Term Preterm AB Living  3 1 1   1 1   SAB TAB Ectopic Multiple Live Births  1     0 1    # Outcome Date GA Lbr Len/2nd Weight Sex Delivery Anes PTL Lv  3 Current           2 SAB 09/2019  [redacted]w[redacted]d         1 Term 09/30/15 [redacted]w[redacted]d 20:59 / 02:46 7 lb 6 oz (3.345 kg) F Vag-Spont EPI N LIV   Physical Assessment:   Vitals:   01/13/20 1417  BP: 108/74  Pulse: 99  Weight: 162 lb (73.5 kg)  Body mass index is 26.15 kg/m.       Physical Examination:  General appearance - well appearing, and in no distress  Mental status - alert, oriented to person, place, and time  Psych:  She has a normal mood and affect  Skin - warm and dry, normal color, no suspicious lesions noted  Chest - effort normal, all lung fields clear to auscultation bilaterally  Heart - normal rate and regular rhythm  Abdomen - soft, nontender  Extremities:  No swelling or varicosities noted  Thin prep pap is not done   TODAY'S FHR: 154 via doppler  Results for orders placed or performed in visit on 01/13/20 (from the past 24 hour(s))  POC Urinalysis Dipstick OB   Collection Time: 01/13/20  2:37 PM  Result Value Ref Range   Color, UA     Clarity, UA     Glucose, UA Negative Negative   Bilirubin, UA     Ketones, UA neg    Spec Grav, UA     Blood, UA neg  pH, UA     POC,PROTEIN,UA Negative Negative, Trace, Small (1+), Moderate (2+), Large (3+), 4+   Urobilinogen, UA     Nitrite, UA neg    Leukocytes, UA Negative Negative   Appearance     Odor      Assessment & Plan:  1) Low-Risk Pregnancy G2P1001 at [redacted]w[redacted]d with an Estimated Date of Delivery: 07/23/20   2) Initial OB visit  3) Recent SAB  4) Low progesterone early in pregnancy> continue prometrium until 14wks  Meds:  Meds ordered this encounter  Medications  . Blood Pressure Monitor MISC    Sig: For regular home bp monitoring during pregnancy    Dispense:  1 each    Refill:  0    Z34.90    Initial labs obtained Continue prenatal vitamins Reviewed n/v relief measures and warning s/s to report Reviewed recommended weight gain based on pre-gravid BMI Encouraged well-balanced diet Genetic & carrier screening discussed: requests Panorama,  NT/IT and Horizon 14  Ultrasound discussed; fetal survey: requested CCNC completed> form faxed if has or is planning to apply for medicaid The nature of [employer] for [employer] with multiple MDs and other Advanced Practice Providers was explained to patient; also emphasized that fellows, residents, and students are part of our team. Does not have home bp cuff. Rx faxed to [facility]. Check bp weekly, let us know if >140/90.    Follow-up: Return for as scheduled Tues for nt u/s and labs, then 11/8 or after for anatomy u/s, 2nd IT and LROB w/ CNM.   Orders Placed This Encounter  Procedures  . GC/Chlamydia Probe Amp  . Urine Culture  . US OB Comp + 14 Wk  . Genetic Screening  . CBC/D/Plt+RPR+Rh+ABO+Rub Ab...  . Pain Management Screening Profile (10S)  . POC Urinalysis Dipstick OB    Cheral Marker CNM, [facility] 01/13/2020 2:49 PM

## 2020-01-13 NOTE — Patient Instructions (Signed)
Arman Filter Gencarelli, I greatly value your feedback.  If you receive a survey following your visit with [place] today, we appreciate you taking the time to fill it out.  Thanks, Joellyn Haff, CNM, WHNP-BC   [facility] ([address] [place], [place] [address]) Entrance C, located off of [address] Free 24/7 valet parking   Nausea & Vomiting  Have saltine crackers or pretzels by your bed and eat a few bites before you raise your head out of bed in the morning  Eat small frequent meals throughout the day instead of large meals  Drink plenty of fluids throughout the day to stay hydrated, just don't drink a lot of fluids with your meals.  This can make your stomach fill up faster making you feel sick  Do not brush your teeth right after you eat  Products with real ginger are good for nausea, like ginger ale and ginger hard candy Make sure it says made with real ginger!  Sucking on sour candy like lemon heads is also good for nausea  If your prenatal vitamins make you nauseated, take them at night so you will sleep through the nausea  Sea Bands  If you feel like you need medicine for the nausea & vomiting please let us know  If you are unable to keep any fluids or food down please let us know   Constipation  Drink plenty of fluid, preferably water, throughout the day  Eat foods high in fiber such as fruits, vegetables, and grains  Exercise, such as walking, is a good way to keep your bowels regular  Drink warm fluids, especially warm prune juice, or decaf coffee  Eat a 1/2 cup of real oatmeal (not instant), 1/2 cup applesauce, and 1/2-1 cup warm prune juice every day  If needed, you may take Colace (docusate sodium) stool softener once or twice a day to help keep the stool soft.   If you still are having problems with constipation, you may take Miralax once daily as needed to help keep your bowels regular.   Home Blood Pressure Monitoring for Patients    Your provider has recommended that you check your blood pressure (BP) at least once a week at home. If you do not have a blood pressure cuff at home, one will be provided for you. Contact your provider if you have not received your monitor within 1 week.   Helpful Tips for Accurate Home Blood Pressure Checks   Don't smoke, exercise, or drink caffeine 30 minutes before checking your BP  Use the restroom before checking your BP (a full bladder can raise your pressure)  Relax in a comfortable upright chair  Feet on the ground  Left arm resting comfortably on a flat surface at the level of your heart  Legs uncrossed  Back supported  Sit quietly and don't talk  Place the cuff on your bare arm  Adjust snuggly, so that only two fingertips can fit between your skin and the top of the cuff  Check 2 readings separated by at least one minute  Keep a log of your BP readings  For a visual, please reference this diagram: http://ccnc.care/bpdiagram  Provider Name: [facility]     Phone: [phone]  Zone 1: ALL CLEAR  Continue to monitor your symptoms:   BP reading is less than 140 (top number) or less than 90 (bottom number)   No right upper stomach pain  No headaches or  seeing spots  No feeling nauseated or throwing up  No swelling in face and hands  Zone 2: CAUTION Call your doctor's office for any of the following:   BP reading is greater than 140 (top number) or greater than 90 (bottom number)   Stomach pain under your ribs in the middle or right side  Headaches or seeing spots  Feeling nauseated or throwing up  Swelling in face and hands  Zone 3: EMERGENCY  Seek immediate medical care if you have any of the following:   BP reading is greater than160 (top number) or greater than 110 (bottom number)  Severe headaches not improving with Tylenol  Serious difficulty catching your breath  Any worsening symptoms from Zone 2    First Trimester of  Pregnancy The first trimester of pregnancy is from week 1 until the end of week 12 (months 1 through 3). A week after a sperm fertilizes an egg, the egg will implant on the wall of the uterus. This embryo will begin to develop into a baby. Genes from you and your partner are forming the baby. The female genes determine whether the baby is a boy or a girl. At 6-8 weeks, the eyes and face are formed, and the heartbeat can be seen on ultrasound. At the end of 12 weeks, all the baby's organs are formed.  Now that you are pregnant, you will want to do everything you can to have a healthy baby. Two of the most important things are to get good prenatal care and to follow your health care provider's instructions. Prenatal care is all the medical care you receive before the baby's birth. This care will help prevent, find, and treat any problems during the pregnancy and childbirth. BODY CHANGES Your body goes through many changes during pregnancy. The changes vary from woman to woman.   You may gain or lose a couple of pounds at first.  You may feel sick to your stomach (nauseous) and throw up (vomit). If the vomiting is uncontrollable, call your health care provider.  You may tire easily.  You may develop headaches that can be relieved by medicines approved by your health care provider.  You may urinate more often. Painful urination may mean you have a bladder infection.  You may develop heartburn as a result of your pregnancy.  You may develop constipation because certain hormones are causing the muscles that push waste through your intestines to slow down.  You may develop hemorrhoids or swollen, bulging veins (varicose veins).  Your breasts may begin to grow larger and become tender. Your nipples may stick out more, and the tissue that surrounds them (areola) may become darker.  Your gums may bleed and may be sensitive to brushing and flossing.  Dark spots or blotches (chloasma, mask of pregnancy)  may develop on your face. This will likely fade after the baby is born.  Your menstrual periods will stop.  You may have a loss of appetite.  You may develop cravings for certain kinds of food.  You may have changes in your emotions from day to day, such as being excited to be pregnant or being concerned that something may go wrong with the pregnancy and baby.  You may have more vivid and strange dreams.  You may have changes in your hair. These can include thickening of your hair, rapid growth, and changes in texture. Some women also have hair loss during or after pregnancy, or hair that feels dry or thin. Your  hair will most likely return to normal after your baby is born. WHAT TO EXPECT AT YOUR PRENATAL VISITS During a routine prenatal visit:  You will be weighed to make sure you and the baby are growing normally.  Your blood pressure will be taken.  Your abdomen will be measured to track your baby's growth.  The fetal heartbeat will be listened to starting around week 10 or 12 of your pregnancy.  Test results from any previous visits will be discussed. Your health care provider may ask you:  How you are feeling.  If you are feeling the baby move.  If you have had any abnormal symptoms, such as leaking fluid, bleeding, severe headaches, or abdominal cramping.  If you have any questions. Other tests that may be performed during your first trimester include:  Blood tests to find your blood type and to check for the presence of any previous infections. They will also be used to check for low iron levels (anemia) and Rh antibodies. Later in the pregnancy, blood tests for diabetes will be done along with other tests if problems develop.  Urine tests to check for infections, diabetes, or protein in the urine.  An ultrasound to confirm the proper growth and development of the baby.  An amniocentesis to check for possible genetic problems.  Fetal screens for spina bifida and  Down syndrome.  You may need other tests to make sure you and the baby are doing well. HOME CARE INSTRUCTIONS  Medicines  Follow your health care provider's instructions regarding medicine use. Specific medicines may be either safe or unsafe to take during pregnancy.  Take your prenatal vitamins as directed.  If you develop constipation, try taking a stool softener if your health care provider approves. Diet  Eat regular, well-balanced meals. Choose a variety of foods, such as meat or vegetable-based protein, fish, milk and low-fat dairy products, vegetables, fruits, and whole grain breads and cereals. Your health care provider will help you determine the amount of weight gain that is right for you.  Avoid raw meat and uncooked cheese. These carry germs that can cause birth defects in the baby.  Eating four or five small meals rather than three large meals a day may help relieve nausea and vomiting. If you start to feel nauseous, eating a few soda crackers can be helpful. Drinking liquids between meals instead of during meals also seems to help nausea and vomiting.  If you develop constipation, eat more high-fiber foods, such as fresh vegetables or fruit and whole grains. Drink enough fluids to keep your urine clear or pale yellow. Activity and Exercise  Exercise only as directed by your health care provider. Exercising will help you:  Control your weight.  Stay in shape.  Be prepared for labor and delivery.  Experiencing pain or cramping in the lower abdomen or low back is a good sign that you should stop exercising. Check with your health care provider before continuing normal exercises.  Try to avoid standing for long periods of time. Move your legs often if you must stand in one place for a long time.  Avoid heavy lifting.  Wear low-heeled shoes, and practice good posture.  You may continue to have sex unless your health care provider directs you otherwise. Relief of Pain  or Discomfort  Wear a good support bra for breast tenderness.    Take warm sitz baths to soothe any pain or discomfort caused by hemorrhoids. Use hemorrhoid cream if your health care provider  approves.    Rest with your legs elevated if you have leg cramps or low back pain.  If you develop varicose veins in your legs, wear support hose. Elevate your feet for 15 minutes, 3-4 times a day. Limit salt in your diet. Prenatal Care  Schedule your prenatal visits by the twelfth week of pregnancy. They are usually scheduled monthly at first, then more often in the last 2 months before delivery.  Write down your questions. Take them to your prenatal visits.  Keep all your prenatal visits as directed by your health care provider. Safety  Wear your seat belt at all times when driving.  Make a list of emergency phone numbers, including numbers for family, friends, the hospital, and police and fire departments. General Tips  Ask your health care provider for a referral to a local prenatal education class. Begin classes no later than at the beginning of month 6 of your pregnancy.  Ask for help if you have counseling or nutritional needs during pregnancy. Your health care provider can offer advice or refer you to specialists for help with various needs.  Do not use hot tubs, steam rooms, or saunas.  Do not douche or use tampons or scented sanitary pads.  Do not cross your legs for long periods of time.  Avoid cat litter boxes and soil used by cats. These carry germs that can cause birth defects in the baby and possibly loss of the fetus by miscarriage or stillbirth.  Avoid all smoking, herbs, alcohol, and medicines not prescribed by your health care provider. Chemicals in these affect the formation and growth of the baby.  Schedule a dentist appointment. At home, brush your teeth with a soft toothbrush and be gentle when you floss. SEEK MEDICAL CARE IF:   You have dizziness.  You have mild  pelvic cramps, pelvic pressure, or nagging pain in the abdominal area.  You have persistent nausea, vomiting, or diarrhea.  You have a bad smelling vaginal discharge.  You have pain with urination.  You notice increased swelling in your face, hands, legs, or ankles. SEEK IMMEDIATE MEDICAL CARE IF:   You have a fever.  You are leaking fluid from your vagina.  You have spotting or bleeding from your vagina.  You have severe abdominal cramping or pain.  You have rapid weight gain or loss.  You vomit blood or material that looks like coffee grounds.  You are exposed to [place] measles and have never had them.  You are exposed to fifth disease or chickenpox.  You develop a severe headache.  You have shortness of breath.  You have any kind of trauma, such as from a fall or a car accident. Document Released: 03/26/2001 Document Revised: 08/16/2013 Document Reviewed: 02/09/2013 [facility] Patient Information 2015 [place], [place]. This information is not intended to replace advice given to you by your health care provider. Make sure you discuss any questions you have with your health care provider.

## 2020-01-15 LAB — URINE CULTURE: Organism ID, Bacteria: NO GROWTH

## 2020-01-15 LAB — GC/CHLAMYDIA PROBE AMP
Chlamydia trachomatis, NAA: NEGATIVE
Neisseria Gonorrhoeae by PCR: NEGATIVE

## 2020-01-16 LAB — PMP SCREEN PROFILE (10S), URINE
Amphetamine Scrn, Ur: NEGATIVE ng/mL
BARBITURATE SCREEN URINE: NEGATIVE ng/mL
BENZODIAZEPINE SCREEN, URINE: NEGATIVE ng/mL
CANNABINOIDS UR QL SCN: NEGATIVE ng/mL
Cocaine (Metab) Scrn, Ur: NEGATIVE ng/mL
Creatinine(Crt), U: 126.8 mg/dL (ref 20.0–300.0)
Methadone Screen, Urine: NEGATIVE ng/mL
OXYCODONE+OXYMORPHONE UR QL SCN: NEGATIVE ng/mL
Opiate Scrn, Ur: NEGATIVE ng/mL
Ph of Urine: 5.3 (ref 4.5–8.9)
Phencyclidine Qn, Ur: NEGATIVE ng/mL
Propoxyphene Scrn, Ur: NEGATIVE ng/mL

## 2020-01-18 ENCOUNTER — Other Ambulatory Visit: Payer: Medicaid - Out of State

## 2020-01-18 ENCOUNTER — Ambulatory Visit (INDEPENDENT_AMBULATORY_CARE_PROVIDER_SITE_OTHER): Payer: Medicaid Other

## 2020-01-18 DIAGNOSIS — Z3682 Encounter for antenatal screening for nuchal translucency: Secondary | ICD-10-CM

## 2020-01-18 DIAGNOSIS — Z3480 Encounter for supervision of other normal pregnancy, unspecified trimester: Secondary | ICD-10-CM

## 2020-01-18 DIAGNOSIS — Z3400 Encounter for supervision of normal first pregnancy, unspecified trimester: Secondary | ICD-10-CM

## 2020-01-18 NOTE — Progress Notes (Signed)
[place] 13+2 wks,measurements c/w dates,crl 71.49 mm,fhr 164 BPM,NT 1.3 mm,NB present,posterior placenta,normal ovaries

## 2020-01-19 LAB — CBC/D/PLT+RPR+RH+ABO+RUB AB...
Antibody Screen: NEGATIVE
Basophils Absolute: 0 10*3/uL (ref 0.0–0.2)
Basos: 0 %
EOS (ABSOLUTE): 0.1 10*3/uL (ref 0.0–0.4)
Eos: 1 %
HCV Ab: <0.1 s/co ratio (ref 0.0–0.9)
HIV Screen 4th Generation wRfx: NONREACTIVE
Hematocrit: 39.4 % (ref 34.0–46.6)
Hemoglobin: 13.5 g/dL (ref 11.1–15.9)
Hepatitis B Surface Ag: NEGATIVE
Immature Grans (Abs): 0 10*3/uL (ref 0.0–0.1)
Immature Granulocytes: 0 %
Lymphocytes Absolute: 1.7 10*3/uL (ref 0.7–3.1)
Lymphs: 22 %
MCH: 31.2 pg (ref 26.6–33.0)
MCHC: 34.3 g/dL (ref 31.5–35.7)
MCV: 91 fL (ref 79–97)
Monocytes Absolute: 0.6 10*3/uL (ref 0.1–0.9)
Monocytes: 8 %
Neutrophils Absolute: 5.1 10*3/uL (ref 1.4–7.0)
Neutrophils: 69 %
Platelets: 222 10*3/uL (ref 150–450)
RBC: 4.33 x10E6/uL (ref 3.77–5.28)
RDW: 12.7 % (ref 11.7–15.4)
RPR Ser Ql: NONREACTIVE
Rh Factor: POSITIVE
Rubella Antibodies, IGG: 1.64 index (ref >0.99)
WBC: 7.5 10*3/uL (ref 3.4–10.8)

## 2020-01-19 LAB — HCV INTERPRETATION

## 2020-01-21 LAB — INTEGRATED 1
Crown Rump Length: 71.5 mm
Gest. Age on Collection Date: 13.1 weeks
Maternal Age at EDD: 29.3 yr
Nuchal Translucency (NT): 1.3 mm
Number of Fetuses: 1
PAPP-A Value: 1340.5 ng/mL
Weight: 163 [lb_av]

## 2020-01-25 ENCOUNTER — Telehealth: Payer: Self-pay

## 2020-01-25 ENCOUNTER — Other Ambulatory Visit: Payer: Self-pay | Admitting: Women's Health

## 2020-01-25 MED ORDER — DOXYLAMINE-PYRIDOXINE 10-10 MG PO TBEC: DELAYED_RELEASE_TABLET | ORAL | 6 refills | Status: DC

## 2020-01-25 NOTE — Telephone Encounter (Signed)
Pt needing her Doxylamine-Pyridoxine 10-10 mg sent to [facility], [place] [address])

## 2020-01-25 NOTE — Telephone Encounter (Signed)
Pt aware Diclegis has been sent to [facility] in [place], [place]. JSY

## 2020-01-27 ENCOUNTER — Encounter: Payer: Self-pay | Admitting: Women's Health

## 2020-01-27 DIAGNOSIS — Z148 Genetic carrier of other disease: Secondary | ICD-10-CM | POA: Insufficient documentation

## 2020-02-07 ENCOUNTER — Telehealth: Payer: Self-pay | Admitting: Women's Health

## 2020-02-07 ENCOUNTER — Encounter: Payer: Self-pay | Admitting: *Deleted

## 2020-02-07 ENCOUNTER — Telehealth: Payer: Self-pay | Admitting: Obstetrics & Gynecology

## 2020-02-07 MED ORDER — BUTALBITAL-APAP-CAFFEINE 50-325-40 MG PO TABS: 1.0000 | ORAL_TABLET | Freq: Four times a day (QID) | ORAL | 0 refills | Status: DC | PRN

## 2020-02-07 NOTE — Telephone Encounter (Signed)
Fioricet e prescribed

## 2020-02-07 NOTE — Telephone Encounter (Signed)
Patient called - concerned with being [redacted] weeks pregnant with a headache for 5 days & cramping Please advise & call pt

## 2020-02-07 NOTE — Telephone Encounter (Signed)
Spoke with patient states that she has had a headache for 5 days, no relief from tylenol. Her BP is 100/60. She does have a hx of migraines. Pt also has some cramping. Advised patient to push fluids. Also advised that I would send a message to a provider to see if we can get meds for HA.

## 2020-02-23 ENCOUNTER — Encounter: Payer: Self-pay | Admitting: *Deleted

## 2020-02-23 DIAGNOSIS — Z3480 Encounter for supervision of other normal pregnancy, unspecified trimester: Secondary | ICD-10-CM

## 2020-02-28 ENCOUNTER — Encounter: Payer: Self-pay | Admitting: Obstetrics & Gynecology

## 2020-02-28 ENCOUNTER — Other Ambulatory Visit: Payer: Self-pay

## 2020-02-28 ENCOUNTER — Ambulatory Visit (INDEPENDENT_AMBULATORY_CARE_PROVIDER_SITE_OTHER): Payer: Medicaid - Out of State | Admitting: Obstetrics & Gynecology

## 2020-02-28 ENCOUNTER — Ambulatory Visit (INDEPENDENT_AMBULATORY_CARE_PROVIDER_SITE_OTHER): Payer: Medicaid Other

## 2020-02-28 ENCOUNTER — Telehealth: Payer: Self-pay | Admitting: Internal Medicine

## 2020-02-28 VITALS — BP 113/71 | HR 97 | Wt 169.5 lb

## 2020-02-28 DIAGNOSIS — Z3A19 19 weeks gestation of pregnancy: Secondary | ICD-10-CM | POA: Diagnosis not present

## 2020-02-28 DIAGNOSIS — Z3480 Encounter for supervision of other normal pregnancy, unspecified trimester: Secondary | ICD-10-CM

## 2020-02-28 DIAGNOSIS — Z363 Encounter for antenatal screening for malformations: Secondary | ICD-10-CM

## 2020-02-28 DIAGNOSIS — Z1389 Encounter for screening for other disorder: Secondary | ICD-10-CM

## 2020-02-28 DIAGNOSIS — Z331 Pregnant state, incidental: Secondary | ICD-10-CM

## 2020-02-28 DIAGNOSIS — Z3481 Encounter for supervision of other normal pregnancy, first trimester: Secondary | ICD-10-CM | POA: Diagnosis not present

## 2020-02-28 DIAGNOSIS — Z1379 Encounter for other screening for genetic and chromosomal anomalies: Secondary | ICD-10-CM

## 2020-02-28 LAB — POCT URINALYSIS DIPSTICK OB
Blood, UA: NEGATIVE
Glucose, UA: NEGATIVE
Ketones, UA: NEGATIVE
Leukocytes, UA: NEGATIVE
Nitrite, UA: NEGATIVE
POC,PROTEIN,UA: NEGATIVE

## 2020-02-28 NOTE — Progress Notes (Signed)
[place] 19+1 wks,breech,cx 4.5 cm,posterior placenta gr 0,normal ovaries,svp of fluid 5.2 cm ,fhr 148 bpm,EFW 301 g 70%,anatomy complete,no obvious abnormalities

## 2020-02-28 NOTE — Telephone Encounter (Signed)
Phoned pt back to find out if anything besides being pregnant has changed with her because she was approved for 1 year to receive Rx and she has 2 refills left. She advised me that she had moved to [place] and we didn't have a current card for her (pt gave me her number on [phone]). I advised pt we do need a copy for our files and I will see what I have to do know in order for her to receive her meds and contact her later.

## 2020-02-28 NOTE — Progress Notes (Signed)
LOW-RISK PREGNANCY VISIT Patient name: Jeanne Reed MRN [id number]  Date of birth: 01/18/1991 Chief Complaint:   Routine Prenatal Visit ([place] & 2nd IT; headaches, cramping and constipated)  History of Present Illness:   Jeanne Reed is a 29 y.o. G104P1011 female at [redacted]w[redacted]d with an Estimated Date of Delivery: 07/23/20 being seen today for ongoing management of a low-risk pregnancy.  Depression screen [facility] 2/9 01/13/2020 10/04/2019 05/21/2019 06/11/2017  Decreased Interest 1 0 3 0  Down, Depressed, Hopeless 0 0 2 0  PHQ - 2 Score 1 0 5 0  Altered sleeping 0 0 3 -  Tired, decreased energy 1 2 3  -  Change in appetite 0 0 0 -  Feeling bad or failure about yourself  0 0 2 -  Trouble concentrating 0 0 1 -  Moving slowly or fidgety/restless 0 0 0 -  Suicidal thoughts 0 0 0 -  PHQ-9 Score 2 2 14  -  Difficult doing work/chores - - Somewhat difficult -    Today she reports no complaints. Contractions: Not present. Vag. Bleeding: None.  Movement: Present. denies leaking of fluid. Review of Systems:   Pertinent items are noted in HPI Denies abnormal vaginal discharge w/ itching/odor/irritation, headaches, visual changes, shortness of breath, chest pain, abdominal pain, severe nausea/vomiting, or problems with urination or bowel movements unless otherwise stated above. Pertinent History Reviewed:  Reviewed past medical,surgical, social, obstetrical and family history.  Reviewed problem list, medications and allergies. Physical Assessment:   Vitals:   02/28/20 1427  BP: 113/71  Pulse: 97  Weight: 169 lb 8 oz (76.9 kg)  Body mass index is 27.36 kg/m.        Physical Examination:   General appearance: Well appearing, and in no distress  Mental status: Alert, oriented to person, place, and time  Skin: Warm & dry  Cardiovascular: Normal heart rate noted  Respiratory: Normal respiratory effort, no distress  Abdomen: Soft, gravid, nontender  Pelvic: Cervical exam deferred         Extremities: Edema:  None  Fetal Status:     Movement: Present    Chaperone: n/a    Results for orders placed or performed in visit on 02/28/20 (from the past 24 hour(s))  POC Urinalysis Dipstick OB   Collection Time: 02/28/20  2:30 PM  Result Value Ref Range   Color, UA     Clarity, UA     Glucose, UA Negative Negative   Bilirubin, UA     Ketones, UA neg    Spec Grav, UA     Blood, UA neg    pH, UA     POC,PROTEIN,UA Negative Negative, Trace, Small (1+), Moderate (2+), Large (3+), 4+   Urobilinogen, UA     Nitrite, UA neg    Leukocytes, UA Negative Negative   Appearance     Odor      Assessment & Plan:  1) Low-risk pregnancy G3P1011 at [redacted]w[redacted]d with an Estimated Date of Delivery: 07/23/20   2) Sonogram is normal,    Meds: No orders of the defined types were placed in this encounter.  Labs/procedures today: IT, 2nd  Plan:  Continue routine obstetrical care  Next visit: prefers in person    Reviewed: Term labor symptoms and general obstetric precautions including but not limited to vaginal bleeding, contractions, leaking of fluid and fetal movement were reviewed in detail with the patient.  All questions were answered.  home bp cuff. Rx faxed to  Check bp weekly, let  us know if >140/90.   Follow-up: Return in about 4 weeks (around 03/27/2020) for LROB.  Orders Placed This Encounter  Procedures  . INTEGRATED 2  . POC Urinalysis Dipstick OB    Lazaro Arms, MD 02/28/2020 2:48 PM

## 2020-02-28 NOTE — Telephone Encounter (Signed)
PATIENT CALLED AND SAID HER PHARMACY NEEDS PRIOR AUTH TO REFILL HER DEXILANT

## 2020-02-29 NOTE — Telephone Encounter (Signed)
FAXED PRIOR AUTHORIZATION FORM TO [employer] IN [place] TO TRY AND GET HER MEDS APPROVED.

## 2020-03-01 ENCOUNTER — Telehealth: Payer: Self-pay | Admitting: Internal Medicine

## 2020-03-01 LAB — INTEGRATED 2
AFP MoM: 2.21
Alpha-Fetoprotein: 95.8 ng/mL
Crown Rump Length: 71.5 mm
DIA MoM: 1.47
DIA Value: 228.6 pg/mL
Estriol, Unconjugated: 2.34 ng/mL
Gest. Age on Collection Date: 13.1 weeks
Gestational Age: 19 weeks
Maternal Age at EDD: 29.3 yr
Nuchal Translucency (NT): 1.3 mm
Nuchal Translucency MoM: 0.8
Number of Fetuses: 1
PAPP-A MoM: 1.16
PAPP-A Value: 1340.5 ng/mL
Test Results:: NEGATIVE
Weight: 163 [lb_av]
Weight: 170 [lb_av]
hCG MoM: 2.08
hCG Value: 45.7 IU/mL
uE3 MoM: 1.35

## 2020-03-01 NOTE — Telephone Encounter (Signed)
Please call patient. She wants an update on her prior [place]. [phone]

## 2020-03-02 NOTE — Telephone Encounter (Signed)
SEE OTHER PHONE NOTE 

## 2020-03-06 ENCOUNTER — Telehealth: Payer: Self-pay | Admitting: Advanced Practice Midwife

## 2020-03-06 ENCOUNTER — Other Ambulatory Visit: Payer: Self-pay | Admitting: Women's Health

## 2020-03-06 MED ORDER — DEXILANT 60 MG PO CPDR: 1.0000 | DELAYED_RELEASE_CAPSULE | Freq: Every day | ORAL | 0 refills | Status: DC

## 2020-03-06 NOTE — Telephone Encounter (Signed)
Pt aware med was sent to pharmacy. JSY °

## 2020-03-06 NOTE — Telephone Encounter (Signed)
Patient called stating that she has been sening a gastro doctor for her medication before her pregnancy for Dexilant 60 mg cap. Pt states he tried to get refills from the gastro provider but they are telling her they do not take her insurance. Are we able to fill this medication for the patient since she is pregnant? Pt states that she uses [facility] on [address] in [place]. Please contact pt

## 2020-03-07 ENCOUNTER — Telehealth: Payer: Self-pay | Admitting: *Deleted

## 2020-03-07 ENCOUNTER — Telehealth: Payer: Self-pay

## 2020-03-07 NOTE — Telephone Encounter (Signed)
Pt's Dexilant 60 mg was approved [id number] approved until 03/07/21. Pt and pharmacy aware. JSY

## 2020-03-07 NOTE — Telephone Encounter (Signed)
FYI: I  have been working on this pt's PA for a couple of weeks because she moved to [place] and had to get their medicaid. I spoke with her today letting her know I'm still working on it. I looked in the med's list another Dr authorized and filled for her yesterday.

## 2020-03-07 NOTE — Telephone Encounter (Signed)
Spoke with the pt today and advised her I was still working on her prior authorization and she gave me the [employer] number [facility]  [phone].) I also got new address for her . States she is coming by the office today to give [place] her new card. I checked her meds list, pt has already had another Dr to approve and send to her pharmacy. Sent Tobi Bastos a FYI note regarding this to see what she wants to do. Also advised Raynelle Fanning of this matter.

## 2020-03-27 ENCOUNTER — Other Ambulatory Visit (HOSPITAL_COMMUNITY)
Admission: RE | Admit: 2020-03-27 | Discharge: 2020-03-27 | Disposition: A | Discharge status: Home / Self Care | Payer: Medicaid Other | Source: Ambulatory Visit | Attending: Advanced Practice Midwife | Admitting: Advanced Practice Midwife

## 2020-03-27 ENCOUNTER — Ambulatory Visit (INDEPENDENT_AMBULATORY_CARE_PROVIDER_SITE_OTHER): Payer: Medicaid Other | Admitting: Advanced Practice Midwife

## 2020-03-27 ENCOUNTER — Other Ambulatory Visit: Payer: Self-pay

## 2020-03-27 VITALS — BP 98/57 | HR 102 | Wt 178.0 lb

## 2020-03-27 DIAGNOSIS — N898 Other specified noninflammatory disorders of vagina: Secondary | ICD-10-CM

## 2020-03-27 DIAGNOSIS — Z331 Pregnant state, incidental: Secondary | ICD-10-CM

## 2020-03-27 DIAGNOSIS — Z1389 Encounter for screening for other disorder: Secondary | ICD-10-CM

## 2020-03-27 DIAGNOSIS — Z3A23 23 weeks gestation of pregnancy: Secondary | ICD-10-CM

## 2020-03-27 LAB — POCT URINALYSIS DIPSTICK OB
Blood, UA: NEGATIVE
Glucose, UA: NEGATIVE
Ketones, UA: NEGATIVE
Leukocytes, UA: NEGATIVE
Nitrite, UA: NEGATIVE
POC,PROTEIN,UA: NEGATIVE

## 2020-03-27 MED ORDER — METRONIDAZOLE 0.75 % EX GEL: CUTANEOUS | 0 refills | Status: DC

## 2020-03-27 NOTE — Patient Instructions (Signed)

## 2020-03-27 NOTE — Addendum Note (Signed)
Addended by: Leilani Able, Naelle Diegel A on: 03/27/2020 03:57 PM   Modules accepted: Orders

## 2020-03-27 NOTE — Progress Notes (Signed)
LOW-RISK PREGNANCY VISIT Patient name: Jeanne Reed MRN [id number]  Date of birth: 12-08-1990 Chief Complaint:   Routine Prenatal Visit  History of Present Illness:   TRYPHENA PERKOVICH is a 29 y.o. G79P1011 female at [redacted]w[redacted]d with an Estimated Date of Delivery: 07/23/20 being seen today for ongoing management of a low-risk pregnancy.  Today she reports vaginal odor, more foul/acidic than fishy for a few weeks. Hx recurrant BV, uses metrogel after intercourse. . Contractions: Not present. Vag. Bleeding: None.  Movement: Present. denies leaking of fluid. Review of Systems:   Pertinent items are noted in HPI Denies abnormal vaginal discharge w/ itching/odor/irritation, headaches, visual changes, shortness of breath, chest pain, abdominal pain, severe nausea/vomiting, or problems with urination or bowel movements unless otherwise stated above. Pertinent History Reviewed:  Reviewed past medical,surgical, social, obstetrical and family history.  Reviewed problem list, medications and allergies. Physical Assessment:   Vitals:   03/27/20 1355  BP: (!) 98/57  Pulse: (!) 102  Weight: 178 lb (80.7 kg)  Body mass index is 28.73 kg/m.        Physical Examination:   General appearance: Well appearing, and in no distress  Mental status: Alert, oriented to person, place, and time  Skin: Warm & dry  Cardiovascular: Normal heart rate noted  Respiratory: Normal respiratory effort, no distress  Abdomen: Soft, gravid, nontender  Pelvic: SSE:  Normal appearing Dc w/o odor.  Nuswab collected.          Extremities: Edema: Trace  Fetal Status: Fetal Heart Rate (bpm): 150 Fundal Height: 21 cm Movement: Present    Chaperone: Clint Bolder    Results for orders placed or performed in visit on 03/27/20 (from the past 24 hour(s))  POC Urinalysis Dipstick OB   Collection Time: 03/27/20  1:55 PM  Result Value Ref Range   Color, UA     Clarity, UA     Glucose, UA Negative Negative   Bilirubin, UA     Ketones, UA  neg    Spec Grav, UA     Blood, UA neg    pH, UA     POC,PROTEIN,UA Negative Negative, Trace, Small (1+), Moderate (2+), Large (3+), 4+   Urobilinogen, UA     Nitrite, UA neg    Leukocytes, UA Negative Negative   Appearance     Odor      Assessment & Plan:  1) Low-risk pregnancy G3P1011 at [redacted]w[redacted]d with an Estimated Date of Delivery: 07/23/20   2) vagianl odor, infectious vs hormonal. Will tx accordingly   Meds:  Meds ordered this encounter  Medications  . metroNIDAZOLE (METROGEL) 0.75 % gel    Sig: Apply one applicator vaginally every night for 5 nights.    Dispense:  45 g    Refill:  0    Order Specific Question:   Supervising Provider    Answer:   Lazaro Arms [[id number]]   Labs/procedures today: NuSwab  Plan:  Continue routine obstetrical care  Next visit: prefers in person    Reviewed: Preterm labor symptoms and general obstetric precautions including but not limited to vaginal bleeding, contractions, leaking of fluid and fetal movement were reviewed in detail with the patient.  All questions were answered. Has home bp cuff.. Check bp weekly, let us know if >140/90.   Follow-up: Return in about 4 weeks (around 04/24/2020) for PN2/LROB.  Orders Placed This Encounter  Procedures  . POC Urinalysis Dipstick OB   Jacklyn Shell DNP, CNM 03/27/2020 2:17 PM

## 2020-03-29 LAB — CERVICOVAGINAL ANCILLARY ONLY
Bacterial Vaginitis (gardnerella): NEGATIVE
Candida Glabrata: NEGATIVE
Candida Vaginitis: POSITIVE — AB
Comment: NEGATIVE
Comment: NEGATIVE
Comment: NEGATIVE

## 2020-03-30 ENCOUNTER — Telehealth: Payer: Self-pay | Admitting: Adult Health

## 2020-03-30 ENCOUNTER — Other Ambulatory Visit: Payer: Self-pay | Admitting: Advanced Practice Midwife

## 2020-03-30 MED ORDER — TERCONAZOLE 0.4 % VA CREA
1.0000 | TOPICAL_CREAM | Freq: Every day | VAGINAL | 0 refills | Status: DC
Start: 2020-03-30 — End: 2020-07-06

## 2020-03-30 NOTE — Progress Notes (Signed)
Terconazole for yeast 

## 2020-03-30 NOTE — Telephone Encounter (Signed)
Pt is [redacted]W[redacted]D pregnant & states her lab results showed a yeast infection, states she used a Rx cream she had on hand & it irritated her worse, would like a pill to take if possible  Please advise & notify pt    [place], [place]

## 2020-03-30 NOTE — Telephone Encounter (Signed)
Terconazole for yeast 

## 2020-04-15 NOTE — L&D Delivery Note (Signed)
LABOR COURSE Patient was admitted for SOL, making cervical change to 5cm with a bulging bag while being evaluated in MAU. She SROM'd clear fluid shortly after epidural was placed. She progressed to complete without  intervention.  Delivery Note Called to room and patient was complete and pushing. Head delivered ROT. No nuchal cord present. Shoulder and body delivered in usual fashion. At 1249 a viable female was delivered via Vaginal, Spontaneous (Presentation: ROT; ROA).  Infant with spontaneous cry, placed on mother's abdomen, dried and stimulated. Cord clamped x 2 after two-minute delay, and cut by FOB. Cord blood drawn. Placenta delivered spontaneously with gentle cord traction. Appears intact. Fundus firm with massage and Pitocin. Labia, perineum, vagina, and cervix inspected.   APGAR: 9, 9; weight : 4090g .    Anesthesia:  Epidural Episiotomy: None Lacerations: right labial abrasion. Hemostatic, repair not indicated Est. Blood Loss (mL): 25  Mom to postpartum.  Baby to Couplet care / Skin to Skin.  Clayton Bibles, CNM 07/27/20 1:05 PM

## 2020-04-24 ENCOUNTER — Other Ambulatory Visit: Payer: Self-pay

## 2020-04-24 ENCOUNTER — Ambulatory Visit (INDEPENDENT_AMBULATORY_CARE_PROVIDER_SITE_OTHER): Payer: Medicaid Other | Admitting: Obstetrics & Gynecology

## 2020-04-24 ENCOUNTER — Other Ambulatory Visit: Payer: Medicaid Other

## 2020-04-24 VITALS — BP 113/74 | HR 99 | Wt 188.0 lb

## 2020-04-24 DIAGNOSIS — Z131 Encounter for screening for diabetes mellitus: Secondary | ICD-10-CM

## 2020-04-24 DIAGNOSIS — Z3A27 27 weeks gestation of pregnancy: Secondary | ICD-10-CM

## 2020-04-24 DIAGNOSIS — Z3483 Encounter for supervision of other normal pregnancy, third trimester: Secondary | ICD-10-CM

## 2020-04-24 DIAGNOSIS — Z331 Pregnant state, incidental: Secondary | ICD-10-CM

## 2020-04-24 DIAGNOSIS — Z3480 Encounter for supervision of other normal pregnancy, unspecified trimester: Secondary | ICD-10-CM

## 2020-04-24 DIAGNOSIS — Z1389 Encounter for screening for other disorder: Secondary | ICD-10-CM

## 2020-04-24 LAB — POCT URINALYSIS DIPSTICK OB
Blood, UA: NEGATIVE
Glucose, UA: NEGATIVE
Ketones, UA: NEGATIVE
Leukocytes, UA: NEGATIVE
Nitrite, UA: NEGATIVE
POC,PROTEIN,UA: NEGATIVE

## 2020-04-24 NOTE — Progress Notes (Signed)
LOW-RISK PREGNANCY VISIT Patient name: Jeanne Reed MRN [id number]  Date of birth: 15-Jan-1991 Chief Complaint:   Routine Prenatal Visit (PN2)  History of Present Illness:   Jeanne Reed is a 30 y.o. G12P1011 female at [redacted]w[redacted]d with an Estimated Date of Delivery: 07/23/20 being seen today for ongoing management of a low-risk pregnancy.  Depression screen [facility] 2/9 04/24/2020 01/13/2020 10/04/2019 05/21/2019 06/11/2017  Decreased Interest 0 1 0 3 0  Down, Depressed, Hopeless 0 0 0 2 0  PHQ - 2 Score 0 1 0 5 0  Altered sleeping 1 0 0 3 -  Tired, decreased energy 1 1 2 3  -  Change in appetite 1 0 0 0 -  Feeling bad or failure about yourself  0 0 0 2 -  Trouble concentrating 0 0 0 1 -  Moving slowly or fidgety/restless 0 0 0 0 -  Suicidal thoughts 0 0 0 0 -  PHQ-9 Score 3 2 2 14  -  Difficult doing work/chores - - - Somewhat difficult -    Today she reports no complaints. Contractions: Not present. Vag. Bleeding: None.  Movement: Present. denies leaking of fluid. Review of Systems:   Pertinent items are noted in HPI Denies abnormal vaginal discharge w/ itching/odor/irritation, headaches, visual changes, shortness of breath, chest pain, abdominal pain, severe nausea/vomiting, or problems with urination or bowel movements unless otherwise stated above. Pertinent History Reviewed:  Reviewed past medical,surgical, social, obstetrical and family history.  Reviewed problem list, medications and allergies. Physical Assessment:   Vitals:   04/24/20 0910  BP: 113/74  Pulse: 99  Weight: 188 lb (85.3 kg)  Body mass index is 30.34 kg/m.        Physical Examination:   General appearance: Well appearing, and in no distress  Mental status: Alert, oriented to person, place, and time  Skin: Warm & dry  Cardiovascular: Normal heart rate noted  Respiratory: Normal respiratory effort, no distress  Abdomen: Soft, gravid, nontender  Pelvic: Cervical exam deferred         Extremities: Edema: Trace  Fetal  Status: Fetal Heart Rate (bpm): 140 Fundal Height: 27 cm Movement: Present    Chaperone: n/a    Results for orders placed or performed in visit on 04/24/20 (from the past 24 hour(s))  POC Urinalysis Dipstick OB   Collection Time: 04/24/20  9:13 AM  Result Value Ref Range   Color, UA     Clarity, UA     Glucose, UA Negative Negative   Bilirubin, UA     Ketones, UA neg    Spec Grav, UA     Blood, UA neg    pH, UA     POC,PROTEIN,UA Negative Negative, Trace, Small (1+), Moderate (2+), Large (3+), 4+   Urobilinogen, UA     Nitrite, UA neg    Leukocytes, UA Negative Negative   Appearance     Odor      Assessment & Plan:  1) Low-risk pregnancy G3P1011 at [redacted]w[redacted]d with an Estimated Date of Delivery: 07/23/20   PN2 today   Meds: No orders of the defined types were placed in this encounter.  Labs/procedures today:   Plan:  Continue routine obstetrical care  Next visit: prefers in person    Reviewed: Preterm labor symptoms and general obstetric precautions including but not limited to vaginal bleeding, contractions, leaking of fluid and fetal movement were reviewed in detail with the patient.  All questions were answered. Has home bp cuff. Rx faxed to .  Check bp weekly, let us know if >140/90.   Follow-up: Return in about 4 weeks (around 05/22/2020).  Orders Placed This Encounter  Procedures  . POC Urinalysis Dipstick OB    Lazaro Arms, MD 04/24/2020 9:29 AM

## 2020-04-25 LAB — RPR: RPR Ser Ql: NONREACTIVE

## 2020-04-25 LAB — CBC
Hematocrit: 34.9 % (ref 34.0–46.6)
Hemoglobin: 11.8 g/dL (ref 11.1–15.9)
MCH: 30.3 pg (ref 26.6–33.0)
MCHC: 33.8 g/dL (ref 31.5–35.7)
MCV: 90 fL (ref 79–97)
Platelets: 262 10*3/uL (ref 150–450)
RBC: 3.9 x10E6/uL (ref 3.77–5.28)
RDW: 12.5 % (ref 11.7–15.4)
WBC: 11.8 10*3/uL — ABNORMAL HIGH (ref 3.4–10.8)

## 2020-04-25 LAB — GLUCOSE TOLERANCE, 2 HOURS W/ 1HR
Glucose, 1 hour: 60 mg/dL — ABNORMAL LOW (ref 65–179)
Glucose, 2 hour: 54 mg/dL — ABNORMAL LOW (ref 65–152)
Glucose, Fasting: 70 mg/dL (ref 65–91)

## 2020-04-25 LAB — HIV ANTIBODY (ROUTINE TESTING W REFLEX): HIV Screen 4th Generation wRfx: NONREACTIVE

## 2020-04-25 LAB — ANTIBODY SCREEN: Antibody Screen: NEGATIVE

## 2020-04-26 ENCOUNTER — Telehealth: Payer: Self-pay | Admitting: Obstetrics & Gynecology

## 2020-04-26 NOTE — Telephone Encounter (Signed)
Pt called to check status of Rx PA for her nausea meds  Please advise & notify pt

## 2020-04-27 ENCOUNTER — Telehealth: Payer: Self-pay | Admitting: *Deleted

## 2020-04-27 NOTE — Telephone Encounter (Signed)
Pt aware Diclegis is requiring a PA. So she don't have to wait on a PA, pt was advised can try OTC Unisom 25 mg, take 1 tablet at bedtime. If symptoms are not adequately controlled, can take half a tab in the am, half a tab mid-afternoon and 1 tab at bedtime. Also, Vit B6 100 mg tab. Take 1 tab BID. Pt voiced understanding. JSY

## 2020-05-22 ENCOUNTER — Encounter: Payer: Medicaid Other | Admitting: Women's Health

## 2020-05-31 ENCOUNTER — Other Ambulatory Visit: Payer: Self-pay | Admitting: Women's Health

## 2020-06-01 ENCOUNTER — Encounter: Payer: Self-pay | Admitting: Women's Health

## 2020-06-01 ENCOUNTER — Ambulatory Visit (INDEPENDENT_AMBULATORY_CARE_PROVIDER_SITE_OTHER): Payer: Medicaid Other | Admitting: Women's Health

## 2020-06-01 ENCOUNTER — Other Ambulatory Visit: Payer: Self-pay

## 2020-06-01 VITALS — BP 129/68 | HR 110 | Wt 199.4 lb

## 2020-06-01 DIAGNOSIS — Z23 Encounter for immunization: Secondary | ICD-10-CM | POA: Diagnosis not present

## 2020-06-01 DIAGNOSIS — Z148 Genetic carrier of other disease: Secondary | ICD-10-CM

## 2020-06-01 DIAGNOSIS — Z3480 Encounter for supervision of other normal pregnancy, unspecified trimester: Secondary | ICD-10-CM

## 2020-06-01 DIAGNOSIS — B009 Herpesviral infection, unspecified: Secondary | ICD-10-CM

## 2020-06-01 DIAGNOSIS — Z3483 Encounter for supervision of other normal pregnancy, third trimester: Secondary | ICD-10-CM

## 2020-06-01 MED ORDER — BUTALBITAL-APAP-CAFFEINE 50-325-40 MG PO TABS: 1.0000 | ORAL_TABLET | Freq: Four times a day (QID) | ORAL | 0 refills | Status: DC | PRN

## 2020-06-01 NOTE — Progress Notes (Signed)
LOW-RISK PREGNANCY VISIT Patient name: Jeanne Reed MRN [id number]  Date of birth: 1990-05-26 Chief Complaint:   Routine Prenatal Visit  History of Present Illness:   Jeanne Reed is a 30 y.o. G58P1011 female at [redacted]w[redacted]d with an Estimated Date of Delivery: 07/23/20 being seen today for ongoing management of a low-risk pregnancy.  Depression screen [facility] 2/9 04/24/2020 01/13/2020 10/04/2019 05/21/2019 06/11/2017  Decreased Interest 0 1 0 3 0  Down, Depressed, Hopeless 0 0 0 2 0  PHQ - 2 Score 0 1 0 5 0  Altered sleeping 1 0 0 3 -  Tired, decreased energy 1 1 2 3  -  Change in appetite 1 0 0 0 -  Feeling bad or failure about yourself  0 0 0 2 -  Trouble concentrating 0 0 0 1 -  Moving slowly or fidgety/restless 0 0 0 0 -  Suicidal thoughts 0 0 0 0 -  PHQ-9 Score 3 2 2 14  -  Difficult doing work/chores - - - Somewhat difficult -    Today she reports requests refill on fioricet, gets headaches w/ orgasms. Contractions: Not present. Vag. Bleeding: None.  Movement: Present. denies leaking of fluid. Review of Systems:   Pertinent items are noted in HPI Denies abnormal vaginal discharge w/ itching/odor/irritation, headaches, visual changes, shortness of breath, chest pain, abdominal pain, severe nausea/vomiting, or problems with urination or bowel movements unless otherwise stated above. Pertinent History Reviewed:  Reviewed past medical,surgical, social, obstetrical and family history.  Reviewed problem list, medications and allergies. Physical Assessment:   Vitals:   06/01/20 1556  BP: 129/68  Pulse: (!) 110  Weight: 199 lb 6.4 oz (90.4 kg)  Body mass index is 32.18 kg/m.        Physical Examination:   General appearance: Well appearing, and in no distress  Mental status: Alert, oriented to person, place, and time  Skin: Warm & dry  Cardiovascular: Normal heart rate noted  Respiratory: Normal respiratory effort, no distress  Abdomen: Soft, gravid, nontender  Pelvic: Cervical exam  deferred         Extremities: Edema: Trace  Fetal Status: Fetal Heart Rate (bpm): 145 Fundal Height: 32 cm Movement: Present    Chaperone: N/A   No results found for this or any previous visit (from the past 24 hour(s)).  Assessment & Plan:  1) Low-risk pregnancy G3P1011 at [redacted]w[redacted]d with an Estimated Date of Delivery: 07/23/20   2) Headaches, refilled fioricet  3) +SMA carrier> FOB not getting tested   Meds:  Meds ordered this encounter  Medications  . butalbital-acetaminophen-caffeine (FIORICET) 50-325-40 MG tablet    Sig: Take 1-2 tablets by mouth every 6 (six) hours as needed for headache.    Dispense:  20 tablet    Refill:  0    Order Specific Question:   Supervising Provider    Answer:   [redacted]w[redacted]d H [2510]   Labs/procedures today: declined flu shot, tdap given  Plan:  Continue routine obstetrical care  Next visit: prefers in person    Reviewed: Preterm labor symptoms and general obstetric precautions including but not limited to vaginal bleeding, contractions, leaking of fluid and fetal movement were reviewed in detail with the patient.  All questions were answered. Has home bp cuff.  Check bp weekly, let 09/22/20 know if >140/90.   Follow-up: Return in about 2 weeks (around 06/15/2020) for LROB, CNM, in person.  Future Appointments  Date Time Provider Department Center  06/15/2020  1:50 PM Cresenzo-Dishmon,  Scarlette Calico, CNM [facility]    Orders Placed This Encounter  Procedures  . Tdap vaccine greater than or equal to 7yo IM   Cheral Marker CNM, [facility] 06/01/2020 4:38 PM

## 2020-06-01 NOTE — Patient Instructions (Signed)
Jeanne Reed, I greatly value your feedback.  If you receive a survey following your visit with [place] today, we appreciate you taking the time to fill it out.  Thanks, Joellyn Haff, CNM, WHNP-BC   [facility] ([address] [place], [place] [address]) Entrance C, located off of [address] valet parking  Go to [email or web address] to register for FREE online childbirth classes   Call the office [phone]) or go to [facility] if:  You begin to have strong, frequent contractions  Your water breaks.  Sometimes it is a big gush of fluid, sometimes it is just a trickle that keeps getting your panties wet or running down your legs  You have vaginal bleeding.  It is normal to have a small amount of spotting if your cervix was checked.   You don't feel your baby moving like normal.  If you don't, get you something to eat and drink and lay down and focus on feeling your baby move.  You should feel at least 10 movements in 2 hours.  If you don't, you should call the office or go to [facility].    Tdap Vaccine  It is recommended that you get the Tdap vaccine during the third trimester of EACH pregnancy to help protect your baby from getting pertussis (whooping cough)  27-36 weeks is the BEST time to do this so that you can pass the protection on to your baby. During pregnancy is better than after pregnancy, but if you are unable to get it during pregnancy it will be offered at the hospital.   You can get this vaccine with [place], at the health department, your family doctor, or some local pharmacies  Everyone who will be around your baby should also be up-to-date on their vaccines before the baby comes. Adults (who are not pregnant) only need 1 dose of Tdap during adulthood.   [place] Pediatricians/Family Doctors:  [facility] [phone]            [facility] [phone]                 [facility]  [phone] (usually not accepting new patients unless you have family there already, you are always welcome to call and ask)       [facility] Department [phone]       [facility] Pediatricians/Family Doctors:   [facility]: [phone]  [facility]: [phone]  [facility]: [phone]  [facility] Doctors:   [facility]: [phone]   [facility]: [phone]  [facility] Doctors:  [facility]: [phone]   Home Blood Pressure Monitoring for Patients   Your provider has recommended that you check your blood pressure (BP) at least once a week at home. If you do not have a blood pressure cuff at home, one will be provided for you. Contact your provider if you have not received your monitor within 1 week.   Helpful Tips for Accurate Home Blood Pressure Checks  . Don't smoke, exercise, or drink caffeine 30 minutes before checking your BP . Use the restroom before checking your BP (a full bladder can raise your pressure) . Relax in a comfortable upright chair . Feet on the ground . Left arm resting comfortably on a flat surface at the level of your heart . Legs uncrossed . Back supported . Sit quietly and don't talk . Place the cuff on your  bare arm . Adjust snuggly, so that only two fingertips can fit between your skin and the top of the cuff . Check 2 readings separated by at least one minute . Keep a log of your BP readings . For a visual, please reference this diagram: http://ccnc.care/bpdiagram  Provider Name: [facility]     Phone: [phone]  Zone 1: ALL CLEAR  Continue to monitor your symptoms:  . BP reading is less than 140 (top number) or less than 90 (bottom number)  . No right upper stomach pain . No headaches or seeing spots . No feeling nauseated or throwing up . No swelling in face and hands  Zone 2: CAUTION Call your  doctor's office for any of the following:  . BP reading is greater than 140 (top number) or greater than 90 (bottom number)  . Stomach pain under your ribs in the middle or right side . Headaches or seeing spots . Feeling nauseated or throwing up . Swelling in face and hands  Zone 3: EMERGENCY  Seek immediate medical care if you have any of the following:  . BP reading is greater than160 (top number) or greater than 110 (bottom number) . Severe headaches not improving with Tylenol . Serious difficulty catching your breath . Any worsening symptoms from Zone 2   Third Trimester of Pregnancy The third trimester is from week 29 through week 42, months 7 through 9. The third trimester is a time when the fetus is growing rapidly. At the end of the ninth month, the fetus is about 20 inches in length and weighs 6-10 pounds.  BODY CHANGES Your body goes through many changes during pregnancy. The changes vary from woman to woman.   Your weight will continue to increase. You can expect to gain 25-35 pounds (11-16 kg) by the end of the pregnancy.  You may begin to get stretch marks on your hips, abdomen, and breasts.  You may urinate more often because the fetus is moving lower into your pelvis and pressing on your bladder.  You may develop or continue to have heartburn as a result of your pregnancy.  You may develop constipation because certain hormones are causing the muscles that push waste through your intestines to slow down.  You may develop hemorrhoids or swollen, bulging veins (varicose veins).  You may have pelvic pain because of the weight gain and pregnancy hormones relaxing your joints between the bones in your pelvis. Backaches may result from overexertion of the muscles supporting your posture.  You may have changes in your hair. These can include thickening of your hair, rapid growth, and changes in texture. Some women also have hair loss during or after pregnancy, or hair that  feels dry or thin. Your hair will most likely return to normal after your baby is born.  Your breasts will continue to grow and be tender. A yellow discharge may leak from your breasts called colostrum.  Your belly button may stick out.  You may feel short of breath because of your expanding uterus.  You may notice the fetus "dropping," or moving lower in your abdomen.  You may have a bloody mucus discharge. This usually occurs a few days to a week before labor begins.  Your cervix becomes thin and soft (effaced) near your due date. WHAT TO EXPECT AT YOUR PRENATAL EXAMS  You will have prenatal exams every 2 weeks until week 36. Then, you will have weekly prenatal exams. During a routine prenatal visit:  You will be weighed to make sure you and the fetus are growing normally.  Your blood pressure is taken.  Your abdomen will be measured to track your baby's growth.  The fetal heartbeat will be listened to.  Any test results from the previous visit will be discussed.  You may have a cervical check near your due date to see if you have effaced. At around 36 weeks, your caregiver will check your cervix. At the same time, your caregiver will also perform a test on the secretions of the vaginal tissue. This test is to determine if a type of bacteria, Group B streptococcus, is present. Your caregiver will explain this further. Your caregiver may ask you:  What your birth plan is.  How you are feeling.  If you are feeling the baby move.  If you have had any abnormal symptoms, such as leaking fluid, bleeding, severe headaches, or abdominal cramping.  If you have any questions. Other tests or screenings that may be performed during your third trimester include:  Blood tests that check for low iron levels (anemia).  Fetal testing to check the health, activity level, and growth of the fetus. Testing is done if you have certain medical conditions or if there are problems during the  pregnancy. FALSE LABOR You may feel small, irregular contractions that eventually go away. These are called Braxton Hicks contractions, or false labor. Contractions may last for hours, days, or even weeks before true labor sets in. If contractions come at regular intervals, intensify, or become painful, it is best to be seen by your caregiver.  SIGNS OF LABOR   Menstrual-like cramps.  Contractions that are 5 minutes apart or less.  Contractions that start on the top of the uterus and spread down to the lower abdomen and back.  A sense of increased pelvic pressure or back pain.  A watery or bloody mucus discharge that comes from the vagina. If you have any of these signs before the 37th week of pregnancy, call your caregiver right away. You need to go to the hospital to get checked immediately. HOME CARE INSTRUCTIONS   Avoid all smoking, herbs, alcohol, and unprescribed drugs. These chemicals affect the formation and growth of the baby.  Follow your caregiver's instructions regarding medicine use. There are medicines that are either safe or unsafe to take during pregnancy.  Exercise only as directed by your caregiver. Experiencing uterine cramps is a good sign to stop exercising.  Continue to eat regular, healthy meals.  Wear a good support bra for breast tenderness.  Do not use hot tubs, steam rooms, or saunas.  Wear your seat belt at all times when driving.  Avoid raw meat, uncooked cheese, cat litter boxes, and soil used by cats. These carry germs that can cause birth defects in the baby.  Take your prenatal vitamins.  Try taking a stool softener (if your caregiver approves) if you develop constipation. Eat more high-fiber foods, such as fresh vegetables or fruit and whole grains. Drink plenty of fluids to keep your urine clear or pale yellow.  Take warm sitz baths to soothe any pain or discomfort caused by hemorrhoids. Use hemorrhoid cream if your caregiver approves.  If you  develop varicose veins, wear support hose. Elevate your feet for 15 minutes, 3-4 times a day. Limit salt in your diet.  Avoid heavy lifting, wear low heal shoes, and practice good posture.  Rest a lot with your legs elevated if you have leg cramps or low  back pain.  Visit your dentist if you have not gone during your pregnancy. Use a soft toothbrush to brush your teeth and be gentle when you floss.  A sexual relationship may be continued unless your caregiver directs you otherwise.  Do not travel far distances unless it is absolutely necessary and only with the approval of your caregiver.  Take prenatal classes to understand, practice, and ask questions about the labor and delivery.  Make a trial run to the hospital.  Pack your hospital bag.  Prepare the baby's nursery.  Continue to go to all your prenatal visits as directed by your caregiver. SEEK MEDICAL CARE IF:  You are unsure if you are in labor or if your water has broken.  You have dizziness.  You have mild pelvic cramps, pelvic pressure, or nagging pain in your abdominal area.  You have persistent nausea, vomiting, or diarrhea.  You have a bad smelling vaginal discharge.  You have pain with urination. SEEK IMMEDIATE MEDICAL CARE IF:   You have a fever.  You are leaking fluid from your vagina.  You have spotting or bleeding from your vagina.  You have severe abdominal cramping or pain.  You have rapid weight loss or gain.  You have shortness of breath with chest pain.  You notice sudden or extreme swelling of your face, hands, ankles, feet, or legs.  You have not felt your baby move in over an hour.  You have severe headaches that do not go away with medicine.  You have vision changes. Document Released: 03/26/2001 Document Revised: 04/06/2013 Document Reviewed: 06/02/2012 [facility] Patient Information 2015 [place], [place]. This information is not intended to replace advice given to you by your health  care provider. Make sure you discuss any questions you have with your health care provider.

## 2020-06-15 ENCOUNTER — Other Ambulatory Visit: Payer: Self-pay

## 2020-06-15 ENCOUNTER — Ambulatory Visit (INDEPENDENT_AMBULATORY_CARE_PROVIDER_SITE_OTHER): Payer: Medicaid Other | Admitting: Women's Health

## 2020-06-15 ENCOUNTER — Encounter: Payer: Self-pay | Admitting: Women's Health

## 2020-06-15 VITALS — BP 125/79 | HR 98 | Wt 203.0 lb

## 2020-06-15 DIAGNOSIS — Z3483 Encounter for supervision of other normal pregnancy, third trimester: Secondary | ICD-10-CM

## 2020-06-15 MED ORDER — PROMETHAZINE HCL 25 MG PO TABS: 12.5000 mg | ORAL_TABLET | Freq: Four times a day (QID) | ORAL | 0 refills | Status: DC | PRN

## 2020-06-15 NOTE — Patient Instructions (Signed)
Arman Filter Talwar, I greatly value your feedback.  If you receive a survey following your visit with [place] today, we appreciate you taking the time to fill it out.  Thanks, Joellyn Haff, CNM, WHNP-BC  [facility] ([address] [place], [place] [address]) Entrance C, located off of [address] valet parking   Go to [email or web address] to register for FREE online childbirth classes    Call the office [phone]) or go to [facility] if:  You begin to have strong, frequent contractions  Your water breaks.  Sometimes it is a big gush of fluid, sometimes it is just a trickle that keeps getting your panties wet or running down your legs  You have vaginal bleeding.  It is normal to have a small amount of spotting if your cervix was checked.   You don't feel your baby moving like normal.  If you don't, get you something to eat and drink and lay down and focus on feeling your baby move.  You should feel at least 10 movements in 2 hours.  If you don't, you should call the office or go to [facility].   Call the office [phone]) or go to [facility] for these signs of pre-eclampsia:  Severe headache that does not go away with Tylenol  Visual changes- seeing spots, double, blurred vision  Pain under your right breast or upper abdomen that does not go away with Tums or heartburn medicine  Nausea and/or vomiting  Severe swelling in your hands, feet, and face   For Headaches:   Stay well hydrated, drink enough water so that your urine is clear, sometimes if you are dehydrated you can get headaches  Eat small frequent meals and snacks, sometimes if you are hungry you can get headaches  Sometimes you get headaches during pregnancy from the pregnancy hormones  You can try tylenol (1-2 regular strength 325mg  or 1-2 extra strength 500mg ) as directed on the box. The least amount of medication that works is best.   Cool compresses (cool wet washcloth  or ice pack) to area of head that is hurting  You can also try drinking a caffeinated drink to see if this will help  If not helping, try below:  For Prevention of Headaches/Migraines:  CoQ10 100mg  three times daily  Vitamin B2 400mg  daily  Magnesium Oxide 400-600mg  daily  If You Get a Bad Headache/Migraine:  Benadryl 25mg    Magnesium Oxide  1 large Gatorade  2 extra strength Tylenol (1,000mg  total)  1 cup coffee or Coke  If this doesn't help please call @ [phone]     Home Blood Pressure Monitoring for Patients   Your provider has recommended that you check your blood pressure (BP) at least once a week at home. If you do not have a blood pressure cuff at home, one will be provided for you. Contact your provider if you have not received your monitor within 1 week.   Helpful Tips for Accurate Home Blood Pressure Checks  . Don't smoke, exercise, or drink caffeine 30 minutes before checking your BP . Use the restroom before checking your BP (a full bladder can raise your pressure) . Relax in a comfortable upright chair . Feet on the ground . Left arm resting comfortably on a flat surface at the level of your heart . Legs uncrossed . Back supported . Sit quietly and don't talk . Place the cuff on your bare arm . Adjust  snuggly, so that only two fingertips can fit between your skin and the top of the cuff . Check 2 readings separated by at least one minute . Keep a log of your BP readings . For a visual, please reference this diagram: http://ccnc.care/bpdiagram  Provider Name: [facility]     Phone: [phone]  Zone 1: ALL CLEAR  Continue to monitor your symptoms:  . BP reading is less than 140 (top number) or less than 90 (bottom number)  . No right upper stomach pain . No headaches or seeing spots . No feeling nauseated or throwing up . No swelling in face and hands  Zone 2: CAUTION Call your doctor's office for any of the following:  . BP  reading is greater than 140 (top number) or greater than 90 (bottom number)  . Stomach pain under your ribs in the middle or right side . Headaches or seeing spots . Feeling nauseated or throwing up . Swelling in face and hands  Zone 3: EMERGENCY  Seek immediate medical care if you have any of the following:  . BP reading is greater than160 (top number) or greater than 110 (bottom number) . Severe headaches not improving with Tylenol . Serious difficulty catching your breath . Any worsening symptoms from Zone 2  Preterm Labor and Birth Information  The normal length of a pregnancy is 39-41 weeks. Preterm labor is when labor starts before 37 completed weeks of pregnancy. What are the risk factors for preterm labor? Preterm labor is more likely to occur in women who:  Have certain infections during pregnancy such as a bladder infection, sexually transmitted infection, or infection inside the uterus (chorioamnionitis).  Have a shorter-than-normal cervix.  Have gone into preterm labor before.  Have had surgery on their cervix.  Are younger than age 30 or older than age 30.  Are African American.  Are pregnant with twins or multiple babies (multiple gestation).  Take street drugs or smoke while pregnant.  Do not gain enough weight while pregnant.  Became pregnant shortly after having been pregnant. What are the symptoms of preterm labor? Symptoms of preterm labor include:  Cramps similar to those that can happen during a menstrual period. The cramps may happen with diarrhea.  Pain in the abdomen or lower back.  Regular uterine contractions that may feel like tightening of the abdomen.  A feeling of increased pressure in the pelvis.  Increased watery or bloody mucus discharge from the vagina.  Water breaking (ruptured amniotic sac). Why is it important to recognize signs of preterm labor? It is important to recognize signs of preterm labor because babies who are born  prematurely may not be fully developed. This can put them at an increased risk for:  Long-term (chronic) heart and lung problems.  Difficulty immediately after birth with regulating body systems, including blood sugar, body temperature, heart rate, and breathing rate.  Bleeding in the brain.  Cerebral palsy.  Learning difficulties.  Death. These risks are highest for babies who are born before 34 weeks of pregnancy. How is preterm labor treated? Treatment depends on the length of your pregnancy, your condition, and the health of your baby. It may involve: 1. Having a stitch (suture) placed in your cervix to prevent your cervix from opening too early (cerclage). 2. Taking or being given medicines, such as: ? Hormone medicines. These may be given early in pregnancy to help support the pregnancy. ? Medicine to stop contractions. ? Medicines to help mature the baby's  lungs. These may be prescribed if the risk of delivery is high. ? Medicines to prevent your baby from developing cerebral palsy. If the labor happens before 34 weeks of pregnancy, you may need to stay in the hospital. What should I do if I think I am in preterm labor? If you think that you are going into preterm labor, call your health care provider right away. How can I prevent preterm labor in future pregnancies? To increase your chance of having a full-term pregnancy:  Do not use any tobacco products, such as cigarettes, chewing tobacco, and e-cigarettes. If you need help quitting, ask your health care provider.  Do not use street drugs or medicines that have not been prescribed to you during your pregnancy.  Talk with your health care provider before taking any herbal supplements, even if you have been taking them regularly.  Make sure you gain a healthy amount of weight during your pregnancy.  Watch for infection. If you think that you might have an infection, get it checked right away.  Make sure to tell your  health care provider if you have gone into preterm labor before. This information is not intended to replace advice given to you by your health care provider. Make sure you discuss any questions you have with your health care provider. Document Revised: 07/24/2018 Document Reviewed: 08/23/2015 Elsevier Patient Education  2020 [employer].

## 2020-06-15 NOTE — Progress Notes (Signed)
LOW-RISK PREGNANCY VISIT Patient name: Jeanne Reed MRN [id number]  Date of birth: 12/10/1990 Chief Complaint:   Routine Prenatal Visit  History of Present Illness:   Jeanne Reed is a 30 y.o. G65P1011 female at [redacted]w[redacted]d with an Estimated Date of Delivery: 07/23/20 being seen today for ongoing management of a low-risk pregnancy.  Depression screen [facility] 2/9 04/24/2020 01/13/2020 10/04/2019 05/21/2019 06/11/2017  Decreased Interest 0 1 0 3 0  Down, Depressed, Hopeless 0 0 0 2 0  PHQ - 2 Score 0 1 0 5 0  Altered sleeping 1 0 0 3 -  Tired, decreased energy 1 1 2 3  -  Change in appetite 1 0 0 0 -  Feeling bad or failure about yourself  0 0 0 2 -  Trouble concentrating 0 0 0 1 -  Moving slowly or fidgety/restless 0 0 0 0 -  Suicidal thoughts 0 0 0 0 -  PHQ-9 Score 3 2 2 14  -  Difficult doing work/chores - - - Somewhat difficult -    Today she reports daily headaches, fioricet helps. Has TMJ, clenches jaws a lot. Contractions: Not present. Vag. Bleeding: None.  Movement: Present. denies leaking of fluid. Review of Systems:   Pertinent items are noted in HPI Denies abnormal vaginal discharge w/ itching/odor/irritation, headaches, visual changes, shortness of breath, chest pain, abdominal pain, severe nausea/vomiting, or problems with urination or bowel movements unless otherwise stated above. Pertinent History Reviewed:  Reviewed past medical,surgical, social, obstetrical and family history.  Reviewed problem list, medications and allergies. Physical Assessment:   Vitals:   06/15/20 1403  BP: 125/79  Pulse: 98  Weight: 203 lb (92.1 kg)  Body mass index is 32.77 kg/m.        Physical Examination:   General appearance: Well appearing, and in no distress  Mental status: Alert, oriented to person, place, and time  Skin: Warm & dry  Cardiovascular: Normal heart rate noted  Respiratory: Normal respiratory effort, no distress  Abdomen: Soft, gravid, nontender  Pelvic: Cervical exam deferred          Extremities: Edema: Trace  Fetal Status: Fetal Heart Rate (bpm): 140 Fundal Height: 35 cm Movement: Present    Chaperone: N/A   No results found for this or any previous visit (from the past 24 hour(s)).  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 promethazine (PHENERGAN) 25 MG tablet    Sig: Take 0.5-1 tablets (12.5-25 mg total) by mouth every 6 (six) hours as needed for nausea or vomiting.    Dispense:  30 tablet    Refill:  0    Order Specific Question:   Supervising Provider    Answer:   0yr [2510]   Labs/procedures today: none  Plan:  Continue routine obstetrical care  Next visit: prefers will be in person for gbs    Reviewed: Preterm labor symptoms and general obstetric precautions including but not limited to vaginal bleeding, contractions, leaking of fluid and fetal movement were reviewed in detail with the patient.  All questions were answered. Has home bp cuff. Check bp weekly, let 05-15-1984 know if >140/90.   Follow-up: Return in about 11 days (around 06/26/2020) for  LROB, CNM, in person.  Future Appointments  Date Time Provider Department Center  06/28/2020  1:50 PM Arabella Merles, CNM [facility]    No orders of the defined types were placed in this encounter.  Cheral Marker CNM, [facility] 06/15/2020 2:27 PM

## 2020-06-28 ENCOUNTER — Ambulatory Visit (INDEPENDENT_AMBULATORY_CARE_PROVIDER_SITE_OTHER): Payer: Medicaid Other | Admitting: Advanced Practice Midwife

## 2020-06-28 ENCOUNTER — Other Ambulatory Visit (HOSPITAL_COMMUNITY)
Admission: RE | Admit: 2020-06-28 | Discharge: 2020-06-28 | Disposition: A | Discharge status: Home / Self Care | Payer: Medicaid Other | Source: Ambulatory Visit | Attending: Advanced Practice Midwife | Admitting: Advanced Practice Midwife

## 2020-06-28 ENCOUNTER — Other Ambulatory Visit: Payer: Self-pay

## 2020-06-28 VITALS — BP 122/81 | HR 112 | Wt 209.0 lb

## 2020-06-28 DIAGNOSIS — F1290 Cannabis use, unspecified, uncomplicated: Secondary | ICD-10-CM

## 2020-06-28 DIAGNOSIS — Z8619 Personal history of other infectious and parasitic diseases: Secondary | ICD-10-CM | POA: Diagnosis not present

## 2020-06-28 DIAGNOSIS — Z3A36 36 weeks gestation of pregnancy: Secondary | ICD-10-CM | POA: Insufficient documentation

## 2020-06-28 DIAGNOSIS — Z3483 Encounter for supervision of other normal pregnancy, third trimester: Secondary | ICD-10-CM | POA: Diagnosis not present

## 2020-06-28 DIAGNOSIS — Z3480 Encounter for supervision of other normal pregnancy, unspecified trimester: Secondary | ICD-10-CM

## 2020-06-28 NOTE — Progress Notes (Signed)
   LOW-RISK PREGNANCY VISIT Patient name: Jeanne Reed MRN [id number]  Date of birth: May 16, 1990 Chief Complaint:   Routine Prenatal Visit  History of Present Illness:   Jeanne Reed is a 30 y.o. G54P1011 female at [redacted]w[redacted]d with an Estimated Date of Delivery: 07/23/20 being seen today for ongoing management of a low-risk pregnancy.  Today she reports noticed 'tingling' near base of introitus on R approx 2d after last visit, and this was while she was taking Valtrex 500mg  daily; it never developed into a lesion and has stayed approx the same 'tingling' sensation. Contractions: Irritability. Vag. Bleeding: None.  Movement: Present. denies leaking of fluid. Review of Systems:   Pertinent items are noted in HPI Denies abnormal vaginal discharge w/ itching/odor/irritation, headaches, visual changes, shortness of breath, chest pain, abdominal pain, severe nausea/vomiting, or problems with urination or bowel movements unless otherwise stated above. Pertinent History Reviewed:  Reviewed past medical,surgical, social, obstetrical and family history.  Reviewed problem list, medications and allergies. Physical Assessment:   Vitals:   06/28/20 1405  BP: 122/81  Pulse: (!) 112  Weight: 209 lb (94.8 kg)  Body mass index is 33.73 kg/m.        Physical Examination:   General appearance: Well appearing, and in no distress  Mental status: Alert, oriented to person, place, and time  Skin: Warm & dry  Cardiovascular: Normal heart rate noted  Respiratory: Normal respiratory effort, no distress  Abdomen: Soft, gravid, nontender  Pelvic: Cervical exam performed  Dilation: Closed Effacement (%): Thick Station: -3 ; no lesion visualized on exam  Extremities: Edema: Trace  Fetal Status: Fetal Heart Rate (bpm): 144 Fundal Height: 36 cm Movement: Present Presentation: Vertex  No results found for this or any previous visit (from the past 24 hour(s)).  Assessment & Plan:  1) Low-risk pregnancy G3P1011 at [redacted]w[redacted]d  with an Estimated Date of Delivery: 07/23/20   2) Hx HSV, has had 'tingling' sensation at base of introitus on R since approx 06/17/20; has been on Valtrex 1g daily all during preg; will have her increase to 1gm bid x 10d, and then can go back to 1gm daily until delivery; may require C/S for mode of delivery, depending on when labor begins- she is aware   Meds: No orders of the defined types were placed in this encounter.  Labs/procedures today: GBS/GC/chlam  Plan:  Continue routine obstetrical care   Reviewed: Term labor symptoms and general obstetric precautions including but not limited to vaginal bleeding, contractions, leaking of fluid and fetal movement were reviewed in detail with the patient.  All questions were answered. Has home bp cuff. Check bp weekly, let 08/17/20 know if >140/90.   Follow-up: Return in about 1 week (around 07/05/2020) for LROB, in person.  Orders Placed This Encounter  Procedures  . Culture, beta strep (group b only)   07/07/2020 [facility] 06/28/2020 2:29 PM

## 2020-06-28 NOTE — Patient Instructions (Signed)

## 2020-06-30 LAB — CERVICOVAGINAL ANCILLARY ONLY
Chlamydia: NEGATIVE
Comment: NEGATIVE
Comment: NORMAL
Neisseria Gonorrhea: NEGATIVE

## 2020-07-03 LAB — CULTURE, BETA STREP (GROUP B ONLY): Strep Gp B Culture: NEGATIVE

## 2020-07-06 ENCOUNTER — Other Ambulatory Visit: Payer: Self-pay

## 2020-07-06 ENCOUNTER — Ambulatory Visit (INDEPENDENT_AMBULATORY_CARE_PROVIDER_SITE_OTHER): Payer: Medicaid Other | Admitting: Obstetrics & Gynecology

## 2020-07-06 ENCOUNTER — Encounter: Payer: Self-pay | Admitting: Obstetrics & Gynecology

## 2020-07-06 VITALS — BP 106/75 | HR 110 | Wt 211.0 lb

## 2020-07-06 DIAGNOSIS — Z3480 Encounter for supervision of other normal pregnancy, unspecified trimester: Secondary | ICD-10-CM

## 2020-07-06 NOTE — Progress Notes (Signed)
Yellow vaginal discharge with odor at times

## 2020-07-06 NOTE — Progress Notes (Signed)
Patient ID: Jeanne Reed, female   DOB: 09-Oct-1990, 30 y.o.   MRN: [id number]   LOW-RISK PREGNANCY VISIT Patient name: Jeanne Reed MRN [id number]  Date of birth: 16-Apr-1990 Chief Complaint:   Routine Prenatal Visit (Vaginal discharge; ? Yeast or BV)  History of Present Illness:   Jeanne Reed is a 30 y.o. G35P1011 female at [redacted]w[redacted]d with an Estimated Date of Delivery: 07/23/20 being seen today for ongoing management of a low-risk pregnancy.  Depression screen [facility] 2/9 04/24/2020 01/13/2020 10/04/2019 05/21/2019 06/11/2017  Decreased Interest 0 1 0 3 0  Down, Depressed, Hopeless 0 0 0 2 0  PHQ - 2 Score 0 1 0 5 0  Altered sleeping 1 0 0 3 -  Tired, decreased energy 1 1 2 3  -  Change in appetite 1 0 0 0 -  Feeling bad or failure about yourself  0 0 0 2 -  Trouble concentrating 0 0 0 1 -  Moving slowly or fidgety/restless 0 0 0 0 -  Suicidal thoughts 0 0 0 0 -  PHQ-9 Score 3 2 2 14  -  Difficult doing work/chores - - - Somewhat difficult -    Today she reports no complaints. Contractions: Not present. Vag. Bleeding: None.  Movement: Present. denies leaking of fluid. Review of Systems:   Pertinent items are noted in HPI Denies abnormal vaginal discharge w/ itching/odor/irritation, headaches, visual changes, shortness of breath, chest pain, abdominal pain, severe nausea/vomiting, or problems with urination or bowel movements unless otherwise stated above. Pertinent History Reviewed:  Reviewed past medical,surgical, social, obstetrical and family history.  Reviewed problem list, medications and allergies. Physical Assessment:   Vitals:   07/06/20 1204  BP: 106/75  Pulse: (!) 110  Weight: 211 lb (95.7 kg)  Body mass index is 34.06 kg/m.        Physical Examination:   General appearance: Well appearing, and in no distress  Mental status: Alert, oriented to person, place, and time  Skin: Warm & dry  Cardiovascular: Normal heart rate noted  Respiratory: Normal respiratory effort, no  distress  Abdomen: Soft, gravid, nontender  Pelvic: Cervical exam performed  Dilation: Closed Effacement (%): Thick Station: -3  Extremities: Edema: Trace  Fetal Status: Fetal Heart Rate (bpm): 142 Fundal Height: 38 cm Movement: Present Presentation: Vertex  Chaperone:    No results found for this or any previous visit (from the past 24 hour(s)).  Assessment & Plan:  1) Low-risk pregnancy G3P1011 at [redacted]w[redacted]d with an Estimated Date of Delivery: 07/23/20   2) discharge is just cervical mucous on wet prep,    Meds: No orders of the defined types were placed in this encounter.  Labs/procedures today:   Plan:  Continue routine obstetrical care  Next visit: prefers in person    Reviewed: Term labor symptoms and general obstetric precautions including but not limited to vaginal bleeding, contractions, leaking of fluid and fetal movement were reviewed in detail with the patient.  All questions were answered.  home bp cuff. Rx faxed to . Check bp weekly, let [redacted]w[redacted]d know if >140/90.   Follow-up: Return in about 1 week (around 07/13/2020) for LROB.  No orders of the defined types were placed in this encounter.   [place], MD 07/06/2020 12:42 PM

## 2020-07-13 ENCOUNTER — Ambulatory Visit (INDEPENDENT_AMBULATORY_CARE_PROVIDER_SITE_OTHER): Payer: Medicaid Other | Admitting: Women's Health

## 2020-07-13 ENCOUNTER — Encounter: Payer: Self-pay | Admitting: Women's Health

## 2020-07-13 ENCOUNTER — Other Ambulatory Visit: Payer: Self-pay

## 2020-07-13 VITALS — BP 117/79 | HR 107 | Wt 214.2 lb

## 2020-07-13 DIAGNOSIS — Z3483 Encounter for supervision of other normal pregnancy, third trimester: Secondary | ICD-10-CM

## 2020-07-13 NOTE — Patient Instructions (Signed)
Arman Filter Blok, I greatly value your feedback.  If you receive a survey following your visit with [place] today, we appreciate you taking the time to fill it out.  Thanks, Joellyn Haff, CNM, WHNP-BC  [facility] ([address] [place], [place] [address]) Entrance C, located off of [address] valet parking   Go to [email or web address] to register for FREE online childbirth classes    Call the office [phone]) or go to [facility] if:  You begin to have strong, frequent contractions  Your water breaks.  Sometimes it is a big gush of fluid, sometimes it is just a trickle that keeps getting your panties wet or running down your legs  You have vaginal bleeding.  It is normal to have a small amount of spotting if your cervix was checked.   You don't feel your baby moving like normal.  If you don't, get you something to eat and drink and lay down and focus on feeling your baby move.  You should feel at least 10 movements in 2 hours.  If you don't, you should call the office or go to [facility].   Call the office [phone]) or go to [facility] hospital for these signs of pre-eclampsia:  Severe headache that does not go away with Tylenol  Visual changes- seeing spots, double, blurred vision  Pain under your right breast or upper abdomen that does not go away with Tums or heartburn medicine  Nausea and/or vomiting  Severe swelling in your hands, feet, and face    Home Blood Pressure Monitoring for Patients   Your provider has recommended that you check your blood pressure (BP) at least once a week at home. If you do not have a blood pressure cuff at home, one will be provided for you. Contact your provider if you have not received your monitor within 1 week.   Helpful Tips for Accurate Home Blood Pressure Checks  . Don't smoke, exercise, or drink caffeine 30 minutes before checking your BP . Use the restroom before checking your BP (a full bladder  can raise your pressure) . Relax in a comfortable upright chair . Feet on the ground . Left arm resting comfortably on a flat surface at the level of your heart . Legs uncrossed . Back supported . Sit quietly and don't talk . Place the cuff on your bare arm . Adjust snuggly, so that only two fingertips can fit between your skin and the top of the cuff . Check 2 readings separated by at least one minute . Keep a log of your BP readings . For a visual, please reference this diagram: http://ccnc.care/bpdiagram  Provider Name: [facility]     Phone: [phone]  Zone 1: ALL CLEAR  Continue to monitor your symptoms:  . BP reading is less than 140 (top number) or less than 90 (bottom number)  . No right upper stomach pain . No headaches or seeing spots . No feeling nauseated or throwing up . No swelling in face and hands  Zone 2: CAUTION Call your doctor's office for any of the following:  . BP reading is greater than 140 (top number) or greater than 90 (bottom number)  . Stomach pain under your ribs in the middle or right side . Headaches or seeing spots . Feeling nauseated or throwing up . Swelling in face and hands  Zone 3: EMERGENCY  Seek immediate medical care if you have any of  the following:  . BP reading is greater than160 (top number) or greater than 110 (bottom number) . Severe headaches not improving with Tylenol . Serious difficulty catching your breath . Any worsening symptoms from Zone 2   Braxton Hicks Contractions Contractions of the uterus can occur throughout pregnancy, but they are not always a sign that you are in labor. You may have practice contractions called Braxton Hicks contractions. These false labor contractions are sometimes confused with true labor. What are [place] contractions? Braxton Hicks contractions are tightening movements that occur in the muscles of the uterus before labor. Unlike true labor contractions, these contractions do  not result in opening (dilation) and thinning of the cervix. Toward the end of pregnancy (32-34 weeks), Braxton Hicks contractions can happen more often and may become stronger. These contractions are sometimes difficult to tell apart from true labor because they can be very uncomfortable. You should not feel embarrassed if you go to the hospital with false labor. Sometimes, the only way to tell if you are in true labor is for your health care provider to look for changes in the cervix. The health care provider will do a physical exam and may monitor your contractions. If you are not in true labor, the exam should show that your cervix is not dilating and your water has not broken. If there are no other health problems associated with your pregnancy, it is completely safe for you to be sent home with false labor. You may continue to have Braxton Hicks contractions until you go into true labor. How to tell the difference between true labor and false labor True labor  Contractions last 30-70 seconds.  Contractions become very regular.  Discomfort is usually felt in the top of the uterus, and it spreads to the lower abdomen and low back.  Contractions do not go away with walking.  Contractions usually become more intense and increase in frequency.  The cervix dilates and gets thinner. False labor  Contractions are usually shorter and not as strong as true labor contractions.  Contractions are usually irregular.  Contractions are often felt in the front of the lower abdomen and in the groin.  Contractions may go away when you walk around or change positions while lying down.  Contractions get weaker and are shorter-lasting as time goes on.  The cervix usually does not dilate or become thin. Follow these instructions at home:  1. Take over-the-counter and prescription medicines only as told by your health care provider. 2. Keep up with your usual exercises and follow other instructions  from your health care provider. 3. Eat and drink lightly if you think you are going into labor. 4. If Braxton Hicks contractions are making you uncomfortable: ? Change your position from lying down or resting to walking, or change from walking to resting. ? Sit and rest in a tub of warm water. ? Drink enough fluid to keep your urine pale yellow. Dehydration may cause these contractions. ? Do slow and deep breathing several times an hour. 5. Keep all follow-up prenatal visits as told by your health care provider. This is important. Contact a health care provider if:  You have a fever.  You have continuous pain in your abdomen. Get help right away if:  Your contractions become stronger, more regular, and closer together.  You have fluid leaking or gushing from your vagina.  You pass blood-tinged mucus (bloody show).  You have bleeding from your vagina.  You have low back  pain that you never had before.  You feel your baby's head pushing down and causing pelvic pressure.  Your baby is not moving inside you as much as it used to. Summary  Contractions that occur before labor are called Braxton Hicks contractions, false labor, or practice contractions.  Braxton Hicks contractions are usually shorter, weaker, farther apart, and less regular than true labor contractions. True labor contractions usually become progressively stronger and regular, and they become more frequent.  Manage discomfort from [facility] contractions by changing position, resting in a warm bath, drinking plenty of water, or practicing deep breathing. This information is not intended to replace advice given to you by your health care provider. Make sure you discuss any questions you have with your health care provider. Document Revised: 03/14/2017 Document Reviewed: 08/15/2016 Elsevier Patient Education  [place].

## 2020-07-13 NOTE — Progress Notes (Signed)
   LOW-RISK PREGNANCY VISIT Patient name: Jeanne Reed MRN [id number]  Date of birth: 06/01/1990 Chief Complaint:   Routine Prenatal Visit  History of Present Illness:   Jeanne Reed is a 30 y.o. G18P1011 female at [redacted]w[redacted]d with an Estimated Date of Delivery: 07/23/20 being seen today for ongoing management of a low-risk pregnancy.  Depression screen [facility] 2/9 04/24/2020 01/13/2020 10/04/2019 05/21/2019 06/11/2017  Decreased Interest 0 1 0 3 0  Down, Depressed, Hopeless 0 0 0 2 0  PHQ - 2 Score 0 1 0 5 0  Altered sleeping 1 0 0 3 -  Tired, decreased energy 1 1 2 3  -  Change in appetite 1 0 0 0 -  Feeling bad or failure about yourself  0 0 0 2 -  Trouble concentrating 0 0 0 1 -  Moving slowly or fidgety/restless 0 0 0 0 -  Suicidal thoughts 0 0 0 0 -  PHQ-9 Score 3 2 2 14  -  Difficult doing work/chores - - - Somewhat difficult -    Today she reports 3 spots of blood on underwear monday- size of pen tip. None since. Contractions: Not present. Vag. Bleeding: Scant.  Movement: Present. denies leaking of fluid. Review of Systems:   Pertinent items are noted in HPI Denies abnormal vaginal discharge w/ itching/odor/irritation, headaches, visual changes, shortness of breath, chest pain, abdominal pain, severe nausea/vomiting, or problems with urination or bowel movements unless otherwise stated above. Pertinent History Reviewed:  Reviewed past medical,surgical, social, obstetrical and family history.  Reviewed problem list, medications and allergies. Physical Assessment:   Vitals:   07/13/20 1344  BP: 117/79  Pulse: (!) 107  Weight: 214 lb 3.2 oz (97.2 kg)  Body mass index is 34.57 kg/m.        Physical Examination:   General appearance: Well appearing, and in no distress  Mental status: Alert, oriented to person, place, and time  Skin: Warm & dry  Cardiovascular: Normal heart rate noted  Respiratory: Normal respiratory effort, no distress  Abdomen: Soft, gravid, nontender  Pelvic: Cervical  exam performed  Dilation: 1 Effacement (%): Thick Station: -3  Extremities: Edema: Trace  Fetal Status: Fetal Heart Rate (bpm): 140 Fundal Height: 37 cm Movement: Present Presentation: Vertex  Chaperone: Angel Neas   No results found for this or any previous visit (from the past 24 hour(s)).  Assessment & Plan:  1) Low-risk pregnancy G3P1011 at [redacted]w[redacted]d with an Estimated Date of Delivery: 07/23/20    Meds: No orders of the defined types were placed in this encounter.  Labs/procedures today: SVE  Plan:  Continue routine obstetrical care  Next visit: prefers in person    Reviewed: Term labor symptoms and general obstetric precautions including but not limited to vaginal bleeding, contractions, leaking of fluid and fetal movement were reviewed in detail with the patient.  All questions were answered. Does have home bp cuff. Office bp cuff given: not applicable. Check bp weekly, let [redacted]w[redacted]d know if consistently >140 and/or >90 .  Follow-up: Return in about 1 week (around 07/20/2020) for LROB, CNM, in person.  No future appointments.  No orders of the defined types were placed in this encounter.  [place] CNM, [facility] 07/13/2020 2:09 PM

## 2020-07-20 ENCOUNTER — Ambulatory Visit (INDEPENDENT_AMBULATORY_CARE_PROVIDER_SITE_OTHER): Payer: Medicaid Other | Admitting: Obstetrics & Gynecology

## 2020-07-20 ENCOUNTER — Other Ambulatory Visit: Payer: Self-pay

## 2020-07-20 ENCOUNTER — Encounter: Payer: Self-pay | Admitting: Obstetrics & Gynecology

## 2020-07-20 VITALS — BP 128/78 | HR 110 | Wt 218.0 lb

## 2020-07-20 DIAGNOSIS — Z3480 Encounter for supervision of other normal pregnancy, unspecified trimester: Secondary | ICD-10-CM

## 2020-07-20 DIAGNOSIS — Z3A39 39 weeks gestation of pregnancy: Secondary | ICD-10-CM

## 2020-07-20 NOTE — Progress Notes (Signed)
   LOW-RISK PREGNANCY VISIT Patient name: Jeanne Reed MRN [id number]  Date of birth: [birth year] Chief Complaint:   Routine Prenatal Visit  History of Present Illness:   Jeanne Reed is a 30 y.o. G66P1011 female at [redacted]w[redacted]d with an Estimated Date of Delivery: 07/23/20 being seen today for ongoing management of a low-risk pregnancy.  Depression screen [facility] 2/9 04/24/2020 01/13/2020 10/04/2019 05/21/2019 06/11/2017  Decreased Interest 0 1 0 3 0  Down, Depressed, Hopeless 0 0 0 2 0  PHQ - 2 Score 0 1 0 5 0  Altered sleeping 1 0 0 3 -  Tired, decreased energy 1 1 2 3  -  Change in appetite 1 0 0 0 -  Feeling bad or failure about yourself  0 0 0 2 -  Trouble concentrating 0 0 0 1 -  Moving slowly or fidgety/restless 0 0 0 0 -  Suicidal thoughts 0 0 0 0 -  PHQ-9 Score 3 2 2 14  -  Difficult doing work/chores - - - Somewhat difficult -    Today she reports pelvic pressure. Contractions: Not present. Vag. Bleeding: None.  Movement: Present. denies leaking of fluid. Review of Systems:   Pertinent items are noted in HPI Denies abnormal vaginal discharge w/ itching/odor/irritation, headaches, visual changes, shortness of breath, chest pain, abdominal pain, severe nausea/vomiting, or problems with urination or bowel movements unless otherwise stated above. Pertinent History Reviewed:  Reviewed past medical,surgical, social, obstetrical and family history.  Reviewed problem list, medications and allergies. Physical Assessment:   Vitals:   07/20/20 1344  BP: 128/78  Pulse: (!) 110  Weight: 218 lb (98.9 kg)  Body mass index is 35.19 kg/m.        Physical Examination:   General appearance: Well appearing, and in no distress  Mental status: Alert, oriented to person, place, and time  Skin: Warm & dry  Cardiovascular: Normal heart rate noted  Respiratory: Normal respiratory effort, no distress  Abdomen: Soft, gravid, nontender  Pelvic: Cervical exam performed  Dilation: 2 Effacement (%): Thick Station:  -2  Extremities: Edema: Trace  Fetal Status: Fetal Heart Rate (bpm): 162 Fundal Height: 39 cm Movement: Present Presentation: Vertex   Chaperone:    No results found for this or any previous visit (from the past 24 hour(s)).  Assessment & Plan:  1) Low-risk pregnancy G3P1011 at [redacted]w[redacted]d with an Estimated Date of Delivery: 07/23/20      Meds: No orders of the defined types were placed in this encounter.  Labs/procedures today:   Plan:  Continue routine obstetrical care  Next visit: prefers in person    Reviewed: Term labor symptoms and general obstetric precautions including but not limited to vaginal bleeding, contractions, leaking of fluid and fetal movement were reviewed in detail with the patient.  All questions were answered. Has home bp cuff. Rx faxed to . Check bp weekly, let [redacted]w[redacted]d know if >140/90.   Follow-up: Return in about 1 week (around 07/27/2020) for NST.  No orders of the defined types were placed in this encounter.   [place], MD 07/20/2020 2:26 PM

## 2020-07-24 ENCOUNTER — Other Ambulatory Visit: Payer: Self-pay | Admitting: Family Medicine

## 2020-07-24 NOTE — Telephone Encounter (Signed)
If utilizing this on a daily basis to suppress herpes outbreak or cold sore outbreaks I recommend the bowels milligrams once daily, #30 with 5 refills

## 2020-07-27 ENCOUNTER — Encounter (HOSPITAL_COMMUNITY): Payer: Self-pay | Admitting: Obstetrics & Gynecology

## 2020-07-27 ENCOUNTER — Encounter (HOSPITAL_COMMUNITY): Payer: Self-pay | Admitting: Obstetrics and Gynecology

## 2020-07-27 ENCOUNTER — Inpatient Hospital Stay (HOSPITAL_COMMUNITY)
Admission: AD | Admit: 2020-07-27 | Discharge: 2020-07-28 | DRG: 807 | Disposition: A | Discharge status: Home / Self Care | Payer: Medicaid Other | Attending: Obstetrics and Gynecology | Admitting: Obstetrics and Gynecology

## 2020-07-27 ENCOUNTER — Other Ambulatory Visit: Payer: Self-pay

## 2020-07-27 ENCOUNTER — Inpatient Hospital Stay (EMERGENCY_DEPARTMENT_HOSPITAL)
Admission: AD | Admit: 2020-07-27 | Discharge: 2020-07-27 | Disposition: A | Discharge status: Home / Self Care | Payer: Medicaid Other | Source: Home / Self Care | Attending: Obstetrics and Gynecology | Admitting: Obstetrics and Gynecology

## 2020-07-27 ENCOUNTER — Inpatient Hospital Stay (HOSPITAL_COMMUNITY): Payer: Medicaid Other | Admitting: Anesthesiology

## 2020-07-27 ENCOUNTER — Other Ambulatory Visit: Payer: Medicaid Other

## 2020-07-27 DIAGNOSIS — Z3A40 40 weeks gestation of pregnancy: Secondary | ICD-10-CM

## 2020-07-27 DIAGNOSIS — O479 False labor, unspecified: Secondary | ICD-10-CM

## 2020-07-27 DIAGNOSIS — Z20822 Contact with and (suspected) exposure to covid-19: Secondary | ICD-10-CM | POA: Diagnosis present

## 2020-07-27 DIAGNOSIS — A6000 Herpesviral infection of urogenital system, unspecified: Secondary | ICD-10-CM | POA: Diagnosis present

## 2020-07-27 DIAGNOSIS — O9852 Other viral diseases complicating childbirth: Secondary | ICD-10-CM | POA: Diagnosis not present

## 2020-07-27 DIAGNOSIS — O480 Post-term pregnancy: Secondary | ICD-10-CM

## 2020-07-27 DIAGNOSIS — O9832 Other infections with a predominantly sexual mode of transmission complicating childbirth: Principal | ICD-10-CM | POA: Diagnosis present

## 2020-07-27 DIAGNOSIS — B009 Herpesviral infection, unspecified: Secondary | ICD-10-CM | POA: Diagnosis not present

## 2020-07-27 DIAGNOSIS — O471 False labor at or after 37 completed weeks of gestation: Secondary | ICD-10-CM | POA: Insufficient documentation

## 2020-07-27 DIAGNOSIS — O26893 Other specified pregnancy related conditions, third trimester: Secondary | ICD-10-CM | POA: Diagnosis present

## 2020-07-27 DIAGNOSIS — Z3400 Encounter for supervision of normal first pregnancy, unspecified trimester: Secondary | ICD-10-CM

## 2020-07-27 LAB — RAPID URINE DRUG SCREEN, HOSP PERFORMED
Amphetamines: NOT DETECTED
Barbiturates: NOT DETECTED
Benzodiazepines: NOT DETECTED
Cocaine: NOT DETECTED
Opiates: NOT DETECTED
Tetrahydrocannabinol: NOT DETECTED

## 2020-07-27 LAB — RESP PANEL BY RT-PCR (FLU A&B, COVID) ARPGX2
Influenza A by PCR: NEGATIVE
Influenza B by PCR: NEGATIVE
SARS Coronavirus 2 by RT PCR: NEGATIVE

## 2020-07-27 LAB — CBC
HCT: 37.9 % (ref 36.0–46.0)
Hemoglobin: 12.1 g/dL (ref 12.0–15.0)
MCH: 27.4 pg (ref 26.0–34.0)
MCHC: 31.9 g/dL (ref 30.0–36.0)
MCV: 85.7 fL (ref 80.0–100.0)
Platelets: 314 10*3/uL (ref 150–400)
RBC: 4.42 MIL/uL (ref 3.87–5.11)
RDW: 16.5 % — ABNORMAL HIGH (ref 11.5–15.5)
WBC: 16.5 10*3/uL — ABNORMAL HIGH (ref 4.0–10.5)
nRBC: 0 % (ref 0.0–0.2)

## 2020-07-27 LAB — TYPE AND SCREEN
ABO/RH(D): O POS
Antibody Screen: NEGATIVE

## 2020-07-27 LAB — RPR: RPR Ser Ql: NONREACTIVE

## 2020-07-27 MED ORDER — PRENATAL MULTIVITAMIN CH
1.0000 | ORAL_TABLET | Freq: Every day | ORAL | Status: DC
  Administered 2020-07-28: 1 via ORAL
  Filled 2020-07-27: qty 1

## 2020-07-27 MED ORDER — DIPHENHYDRAMINE HCL 50 MG/ML IJ SOLN: 50.0000 mg | Freq: Once | INTRAMUSCULAR | Status: DC | PRN

## 2020-07-27 MED ORDER — FENTANYL CITRATE (PF) 100 MCG/2ML IJ SOLN
INTRAMUSCULAR | Status: AC
  Filled 2020-07-27: qty 2

## 2020-07-27 MED ORDER — COCONUT OIL OIL: 1.0000 "application " | TOPICAL_OIL | Status: DC | PRN

## 2020-07-27 MED ORDER — FENTANYL-BUPIVACAINE-NACL 0.5-0.125-0.9 MG/250ML-% EP SOLN
12.0000 mL/h | EPIDURAL | Status: DC | PRN
  Filled 2020-07-27: qty 250

## 2020-07-27 MED ORDER — DIPHENHYDRAMINE HCL 50 MG/ML IJ SOLN
12.5000 mg | INTRAMUSCULAR | Status: DC | PRN
Start: 2020-07-27 — End: 2020-07-27

## 2020-07-27 MED ORDER — MAGNESIUM HYDROXIDE 400 MG/5ML PO SUSP: 30.0000 mL | ORAL | Status: DC | PRN

## 2020-07-27 MED ORDER — PANTOPRAZOLE SODIUM 40 MG PO TBEC
40.0000 mg | DELAYED_RELEASE_TABLET | Freq: Every day | ORAL | Status: DC
  Administered 2020-07-27 – 2020-07-28 (×2): 40 mg via ORAL
  Filled 2020-07-27 (×2): qty 1

## 2020-07-27 MED ORDER — OXYTOCIN-SODIUM CHLORIDE 30-0.9 UT/500ML-% IV SOLN
2.5000 [IU]/h | INTRAVENOUS | Status: DC
  Filled 2020-07-27: qty 500

## 2020-07-27 MED ORDER — LACTATED RINGERS IV SOLN: INTRAVENOUS | Status: DC

## 2020-07-27 MED ORDER — LIDOCAINE HCL (PF) 1 % IJ SOLN: 30.0000 mL | INTRAMUSCULAR | Status: DC | PRN

## 2020-07-27 MED ORDER — FENTANYL-BUPIVACAINE-NACL 0.5-0.125-0.9 MG/250ML-% EP SOLN
EPIDURAL | Status: DC | PRN
  Administered 2020-07-27: 12 mL/h via EPIDURAL

## 2020-07-27 MED ORDER — ONDANSETRON HCL 4 MG PO TABS: 4.0000 mg | ORAL_TABLET | ORAL | Status: DC | PRN

## 2020-07-27 MED ORDER — ACETAMINOPHEN 325 MG PO TABS
650.0000 mg | ORAL_TABLET | ORAL | Status: DC | PRN
  Administered 2020-07-27: 650 mg via ORAL
  Filled 2020-07-27: qty 2

## 2020-07-27 MED ORDER — LACTATED RINGERS IV SOLN: 500.0000 mL | INTRAVENOUS | Status: DC | PRN

## 2020-07-27 MED ORDER — DIPHENHYDRAMINE HCL 25 MG PO CAPS: 25.0000 mg | ORAL_CAPSULE | Freq: Four times a day (QID) | ORAL | Status: DC | PRN

## 2020-07-27 MED ORDER — LACTATED RINGERS IV SOLN: 500.0000 mL | Freq: Once | INTRAVENOUS | Status: DC

## 2020-07-27 MED ORDER — PHENYLEPHRINE 40 MCG/ML (10ML) SYRINGE FOR IV PUSH (FOR BLOOD PRESSURE SUPPORT): 80.0000 ug | PREFILLED_SYRINGE | INTRAVENOUS | Status: DC | PRN

## 2020-07-27 MED ORDER — ONDANSETRON HCL 4 MG/2ML IJ SOLN: 4.0000 mg | INTRAMUSCULAR | Status: DC | PRN

## 2020-07-27 MED ORDER — OXYTOCIN BOLUS FROM INFUSION
333.0000 mL | Freq: Once | INTRAVENOUS | Status: AC
  Administered 2020-07-27: 333 mL via INTRAVENOUS

## 2020-07-27 MED ORDER — SOD CITRATE-CITRIC ACID 500-334 MG/5ML PO SOLN: 30.0000 mL | ORAL | Status: DC | PRN

## 2020-07-27 MED ORDER — EPHEDRINE 5 MG/ML INJ: 10.0000 mg | INTRAVENOUS | Status: DC | PRN

## 2020-07-27 MED ORDER — SIMETHICONE 80 MG PO CHEW
80.0000 mg | CHEWABLE_TABLET | ORAL | Status: DC | PRN
  Administered 2020-07-28: 80 mg via ORAL
  Filled 2020-07-27: qty 1

## 2020-07-27 MED ORDER — FENTANYL CITRATE (PF) 100 MCG/2ML IJ SOLN
100.0000 ug | INTRAMUSCULAR | Status: DC | PRN
  Administered 2020-07-27: 100 ug via INTRAVENOUS

## 2020-07-27 MED ORDER — ACETAMINOPHEN 325 MG PO TABS: 650.0000 mg | ORAL_TABLET | ORAL | Status: DC | PRN

## 2020-07-27 MED ORDER — WITCH HAZEL-GLYCERIN EX PADS: 1.0000 "application " | MEDICATED_PAD | CUTANEOUS | Status: DC | PRN

## 2020-07-27 MED ORDER — IBUPROFEN 600 MG PO TABS: 600.0000 mg | ORAL_TABLET | Freq: Once | ORAL | Status: DC

## 2020-07-27 MED ORDER — LIDOCAINE HCL (PF) 1 % IJ SOLN
INTRAMUSCULAR | Status: DC | PRN
  Administered 2020-07-27: 5 mL via EPIDURAL

## 2020-07-27 MED ORDER — BENZOCAINE-MENTHOL 20-0.5 % EX AERO: 1.0000 "application " | INHALATION_SPRAY | CUTANEOUS | Status: DC | PRN

## 2020-07-27 MED ORDER — ONDANSETRON HCL 4 MG/2ML IJ SOLN: 4.0000 mg | Freq: Four times a day (QID) | INTRAMUSCULAR | Status: DC | PRN

## 2020-07-27 MED ORDER — DIBUCAINE (PERIANAL) 1 % EX OINT: 1.0000 "application " | TOPICAL_OINTMENT | CUTANEOUS | Status: DC | PRN

## 2020-07-27 MED ORDER — IBUPROFEN 600 MG PO TABS
600.0000 mg | ORAL_TABLET | Freq: Four times a day (QID) | ORAL | Status: DC
  Administered 2020-07-27 – 2020-07-28 (×4): 600 mg via ORAL
  Filled 2020-07-27 (×4): qty 1

## 2020-07-27 NOTE — Discharge Instructions (Signed)

## 2020-07-27 NOTE — Progress Notes (Signed)
Jeanne Reed is a 30 y.o. G3P1011 at [redacted]w[redacted]d  Subjective: Denies perineal pressure, denies urge to push.   Objective: BP 124/71   Pulse (!) 102   Temp (!) 97.2 F (36.2 C) (Oral)   Resp 16   LMP  (LMP Unknown)   SpO2 98%  No intake/output data recorded. No intake/output data recorded.  FHT:  FHR: 130 bpm, variability: moderate,  accelerations:  Present,  decelerations:  Absent UC:   regular, every 2 minutes SVE:   Dilation: Lip/rim Effacement (%): 100 Station: Plus 1 Exam by:: Lima, rn  Labs: Lab Results  Component Value Date   WBC 16.5 (H) 07/27/2020   HGB 12.1 07/27/2020   HCT 37.9 07/27/2020   MCV 85.7 07/27/2020   PLT 314 07/27/2020    Assessment / Plan: --30 y.o. G3P1011 at [redacted]w[redacted]d  --Now 9.5/100/+1 without urge to push --Cat I tracing --GBS Neg --S/p SROM at end of epidural placement (clear fluid, afebrile) --Expectant management, will reassess for complete dilation in one hour unless patient reports urge to push before then  Calvert Cantor, CNM 07/27/2020, 12:04 PM

## 2020-07-27 NOTE — MAU Note (Signed)
Presents with ctxs every 3-5 minutes.  Denies LOF or VB.  Endorses +F.  Sent home from mau during the night because no cervical change

## 2020-07-27 NOTE — H&P (Signed)
Jeanne Reed is a 30 y.o. female presenting for SOL. On admission she endorses painful recurrent contractions, loci of pain in her lower abdomen, low back and groin. She denies vaginal bleeding, leaking of fluid, decreased fetal movement, fever, falls, or recent illness.   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  Flu vaccine declined [place]/Hgb Elec neg  TDaP vaccine 06/01/20     Rhogam n/a Blood Type O/Positive/-- (10/05 1413)    Antibody Negative (01/10 0851)  Anatomy US Normal female 'Damon' HBsAg Negative (10/05 1413)  Feeding Plan breast RPR Non Reactive (01/10 0851)  Contraception POPs Rubella  1.64 (10/05 1413)  Circumcision Yes, at [facility] HIV Non Reactive (01/10 0851)  Pediatrician Luking Hep C neg  Prenatal Classes discussed      A1C/GTT Early:      26-28wks: normal  BTL Consent     VBAC Consent n/a GBS    neg    [ ]  PCN allergy  Waterbirth [ ] Class [ ] Consent [ ] CNM visit      Past Medical History:  Diagnosis Date  . Acid reflux   . Anxiety   . Depression   . GERD (gastroesophageal reflux disease)   . Nausea and vomiting during pregnancy 02/08/2015   Past Surgical History:  Procedure Laterality Date  . BIOPSY  05/05/2018   Procedure: BIOPSY;  Surgeon: , MD;  Location: [facility];  Service: Endoscopy;;  gastric esophagus  . ESOPHAGOGASTRODUODENOSCOPY (EGD) WITH PROPOFOL N/A 05/05/2018   Dysphagia due to uncontrolled GERD. Small hiatal hernia noted. Mild gastritis. Empiric dilatation.  02/10/2015 SAVORY DILATION N/A 05/05/2018   Procedure: SAVORY DILATION;  Surgeon:  West Bali, MD;  Location: [facility];  Service: Endoscopy;  Laterality: N/A;  . TONSILLECTOMY    . WISDOM TOOTH EXTRACTION     Family History: family history includes Breast cancer in her paternal grandmother; Cancer in her maternal grandfather and paternal grandmother; Colon cancer in her paternal grandmother; Diabetes in her maternal grandfather; Emphysema in her paternal grandfather; Hematuria in her maternal grandmother and mother. Social History:  reports that she has never smoked. She has never used smokeless tobacco. She reports previous drug use. Drug: Marijuana. She reports that she does not drink alcohol.     Maternal Diabetes: No Genetic Screening: Abnormal: Low risk NIPS, (maternal) SMA carrier, declined partner testing Maternal Ultrasounds/Referrals: Normal Fetal Ultrasounds or other Referrals:  None Maternal Substance Abuse:  Yes:  Type: Marijuana, UDS ordered on admission Significant Maternal Medications:  Valtrex for HSV 2, no lesions Significant Maternal Lab Results:  Group B Strep negative Other Comments:  None  Review of Systems  Gastrointestinal: Positive for abdominal pain.  Musculoskeletal: Positive for  back pain.  All other systems reviewed and are negative.  Dilation: 5 Effacement (%): 70 Station: -1 Exam by:: Thalia Bloodgood, CN Blood pressure 114/73, pulse 97, temperature 98.1 F (36.7 C), temperature source Oral, resp. rate 20, SpO2 99 %. Physical Exam Vitals and nursing note reviewed. Exam conducted with a chaperone present.  Constitutional:      Appearance: Normal appearance.  Cardiovascular:     Rate and Rhythm: Normal rate.     Pulses: Normal pulses.     Heart sounds: Normal heart sounds.  Pulmonary:     Effort: Pulmonary effort is normal.     Breath sounds: Normal breath sounds.  Abdominal:     Comments: Gravid  Skin:    Capillary Refill: Capillary refill takes less than 2 seconds.  Neurological:     Mental Status: She is alert and  oriented to person, place, and time.  Psychiatric:        Mood and Affect: Mood normal.        Behavior: Behavior normal.        Thought Content: Thought content normal.        Judgment: Judgment normal.     Prenatal labs: ABO, Rh: O/Positive/-- (10/05 1413) Antibody: Negative (01/10 0851) Rubella: 1.64 (10/05 1413) RPR: Non Reactive (01/10 0851)  HBsAg: Negative (10/05 1413)  HIV: Non Reactive (01/10 0851)  GBS: Negative/-- (03/17 1350)   Fetal Surveillance Cat I tracing Baseline 135, mod var, +15 x 15 accels, no decels Toco: Irregular contractions q 2-5 min, palpate moderate EFW 70% at 19+2  Assessment/Plan: --30 y.o. G3P1011 at [redacted]w[redacted]d  --SOL, 5cm with BBOW on admission per CNM exam in MAU --Cat I tracing --GBS neg --HSV, on suppression, no lesions --Patient desires epidural placement on admission --boy/inpt circ/breast/POPs  Labor --Assess for spontaneous cervical change s/p epidural  Postpartum Planning --SW consult: THC positive during pregnancy --Inpatient circ --Hx anxiety: offer two week mood check  Calvert Cantor, CNM 07/27/2020, 8:56 AM

## 2020-07-27 NOTE — Lactation Note (Signed)
This note was copied from a baby's chart. Lactation Consultation Note  Patient Name: Jeanne Reed [id number] Date: 07/27/2020 Reason for consult: L&D Initial assessment;Term Age:29 hours  L&D consult with >60 minutes old infant and P2 mother. Parents are present at time of consult. Congratulated them on their newborn. Per mother, infant was skin to skin for more than 60 minutes and latched without issues. Discussed STS as ideal transition for infants after birth helping with temperature, blood sugar and comfort. Talked about primal reflexes such as rooting, hands to mouth, searching for the breast among others.   No latch or hand expression assistance at this time. Explained LC services availability during postpartum stay. Thanked family for their time.    Maternal Data Has patient been taught Hand Expression?: No Does the patient have breastfeeding experience prior to this delivery?: Yes How long did the patient breastfeed?: >12 months  Feeding Mother's Current Feeding Choice: Breast Milk  LATCH Score Latch: Repeated attempts needed to sustain latch, nipple held in mouth throughout feeding, stimulation needed to elicit sucking reflex.   Type of Nipple: Everted at rest and after stimulation  Comfort (Breast/Nipple): Soft / non-tender  Hold (Positioning): Assistance needed to correctly position infant at breast and maintain latch.  Interventions Interventions: Skin to skin;Breast feeding basics reviewed;Expressed milk  Consult Status Consult Status: Follow-up Date: 07/27/20 Follow-up type: In-patient    Jeanne Reed A Higuera Ancidey 07/27/2020, 2:11 PM

## 2020-07-27 NOTE — MAU Provider Note (Signed)
    S: Ms. Jeanne Reed is a 30 y.o. G3P1011 at [redacted]w[redacted]d  who presents to MAU today complaining contractions q 5 minutes since 1930 on 4/13. She denies vaginal bleeding. She denies LOF. She reports normal fetal movement.    O: BP 114/70   Pulse 98   Temp 98.2 F (36.8 C)   Resp 20   Ht 5\' 6"  (1.676 m)   Wt 100.2 kg   LMP  (LMP Unknown)   BMI 35.64 kg/m  GENERAL: Well-developed, well-nourished female in no acute distress.  HEAD: Normocephalic, atraumatic.  CHEST: Normal effort of breathing, regular heart rate ABDOMEN: Soft, nontender, gravid  Cervical exam:  Dilation: 3.5 Effacement (%): 90 Station: -1 Exam by:: A. Carter RN (unchanged after 1 hr)  Fetal Monitoring: Baseline: 130s Variability: avg Accelerations: + Decelerations: none Contractions: q 4-6 mins   A: SIUP at [redacted]w[redacted]d  False labor vs prodromal  P: D/C home with labor/ROM/bldg precautions Keep next scheduled OB visit  [redacted]w[redacted]d 07/27/2020 7:03 AM

## 2020-07-27 NOTE — Anesthesia Procedure Notes (Signed)
Epidural Patient location during procedure: OB Start time: 07/27/2020 10:10 AM End time: 07/27/2020 10:26 AM  Staffing Anesthesiologist: Trevor Iha, MD Performed: anesthesiologist   Preanesthetic Checklist Completed: patient identified, IV checked, site marked, risks and benefits discussed, surgical consent, monitors and equipment checked, pre-op evaluation and timeout performed  Epidural Patient position: sitting Prep: DuraPrep and site prepped and draped Patient monitoring: continuous pulse ox and blood pressure Approach: midline Location: L3-L4 Injection technique: LOR air  Needle:  Needle type: Tuohy  Needle gauge: 17 G Needle length: 9 cm and 9 Needle insertion depth: 6 cm Catheter type: closed end flexible Catheter size: 19 Gauge Catheter at skin depth: 12 cm Test dose: negative  Assessment Events: blood not aspirated, injection not painful, no injection resistance, no paresthesia and negative IV test  Additional Notes Patient identified. Risks/Benefits/Options discussed with patient including but not limited to bleeding, infection, nerve damage, paralysis, failed block, incomplete pain control, headache, blood pressure changes, nausea, vomiting, reactions to medication both or allergic, itching and postpartum back pain. Confirmed with bedside nurse the patient's most recent platelet count. Confirmed with patient that they are not currently taking any anticoagulation, have any bleeding history or any family history of bleeding disorders. Patient expressed understanding and wished to proceed. All questions were answered. Sterile technique was used throughout the entire procedure. Please see nursing notes for vital signs. Test dose was given through epidural needle and negative prior to continuing to dose epidural or start infusion. Warning signs of high block given to the patient including shortness of breath, tingling/numbness in hands, complete motor block, or any  concerning symptoms with instructions to call for help. Patient was given instructions on fall risk and not to get out of bed. All questions and concerns addressed with instructions to call with any issues. 1 Attempt (S) . Patient tolerated procedure well.

## 2020-07-27 NOTE — Discharge Summary (Signed)
Postpartum Discharge Summary       Patient Name: Jeanne Reed DOB: December 20, 1990 MRN: [id number]  Date of admission: 07/27/2020 Delivery date:07/27/2020  Delivering provider: Darlina Rumpf  Date of discharge: 07/28/2020  Admitting diagnosis: Labor and delivery, indication for care [O75.9] Intrauterine pregnancy: [redacted]w[redacted]d    Secondary diagnosis:  Active Problems:   Labor and delivery, indication for care  Additional problems: none   Discharge diagnosis: Term Pregnancy Delivered                                              Post partum procedures:N/A Augmentation: N/A Complications: None  Hospital course: Onset of Labor With Vaginal Delivery      30y.o. yo G3P1011 at 455w4das admitted in Active Labor on 07/27/2020. Patient had an uncomplicated labor course as follows:  Membrane Rupture Time/Date: 10:24 AM ,07/27/2020   Delivery Method:Vaginal, Spontaneous  Episiotomy: None  Lacerations:  Labial  Patient had an uncomplicated postpartum course.  She is ambulating, tolerating a regular diet, passing flatus, and urinating well. Patient is discharged home in stable condition on 07/28/20.  Newborn Data: Birth date:07/27/2020  Birth time:12:49 PM  Gender:Female  Living status:Living  Apgars:9 ,9  Weight:4090 g   Magnesium Sulfate received: No BMZ received: No Rhophylac:N/A MMR:No T-DaP:Given prenatally Flu: No Declined by patient Transfusion:No  Physical exam  Vitals:   07/27/20 1559 07/27/20 2020 07/27/20 2351 07/28/20 0456  BP: 106/65 130/68 110/64 116/67  Pulse: 89 98 87 79  Resp: 18 17 18 16   Temp: 98.1 F (36.7 C) 98.1 F (36.7 C) 98.2 F (36.8 C) 98.3 F (36.8 C)  TempSrc: Oral  Oral Oral  SpO2: 98% 97% 98% 99%   General: alert, cooperative and no distress Lochia: appropriate Uterine Fundus: firm Incision: N/A DVT Evaluation: No evidence of DVT seen on physical exam. Labs: Lab Results  Component Value Date   WBC 16.5 (H) 07/27/2020   HGB 12.1  07/27/2020   HCT 37.9 07/27/2020   MCV 85.7 07/27/2020   PLT 314 07/27/2020   CMP Latest Ref Rng & Units 10/07/2019  Glucose 70 - 99 mg/dL 97  BUN 6 - 20 mg/dL 15  Creatinine 0.44 - 1.00 mg/dL 0.85  Sodium 135 - 145 mmol/L 136  Potassium 3.5 - 5.1 mmol/L 4.4  Chloride 98 - 111 mmol/L 103  CO2 22 - 32 mmol/L 26  Calcium 8.9 - 10.3 mg/dL 9.1  Total Protein 6.5 - 8.1 g/dL -  Total Bilirubin 0.3 - 1.2 mg/dL -  Alkaline Phos 38 - 126 U/L -  AST 15 - 41 U/L -  ALT 14 - 54 U/L -   Edinburgh Score: Edinburgh Postnatal Depression Scale Screening Tool 07/27/2020  I have been able to laugh and see the funny side of things. (No Data)     After visit meds:  Allergies as of 07/28/2020      Reactions   Celexa [citalopram] Other (See Comments)   Sexual dysfunction      Medication List    STOP taking these medications   Blood Pressure Monitor Misc   butalbital-acetaminophen-caffeine 50-325-40 MG tablet Commonly known as: FIORICET   dexlansoprazole 60 MG capsule Commonly known as: DEXILANT   famotidine 10 MG tablet Commonly known as: PEPCID   promethazine 25 MG tablet Commonly known as: PHENERGAN   valACYclovir 1000 MG  tablet Commonly known as: VALTREX     TAKE these medications   ibuprofen 600 MG tablet Commonly known as: ADVIL Take 1 tablet (600 mg total) by mouth every 6 (six) hours.   norethindrone 0.35 MG tablet Commonly known as: Ortho Micronor Take 1 tablet (0.35 mg total) by mouth daily. Start taking the Sunday after the baby turns 28 weeks old   PRENATAL VITAMIN PO Take by mouth.       Discharge home in stable condition Infant Feeding: Breast Infant Disposition:home with mother Discharge instruction: per After Visit Summary and Postpartum booklet. Activity: Advance as tolerated. Pelvic rest for 6 weeks.  Diet: routine diet Future Appointments:No future appointments. Follow up Visit:  Follow-up Information    [facility]. Schedule an  appointment as soon as possible for a visit in 4 week(s).   Specialty: Obstetrics and Gynecology Why: for postpartum checkup Contact information: [address] [place] [phone]             Message sent by Jeronimo Greaves, CNM to coordinate postpartum care  Please schedule this patient for a Virtual postpartum visit in 4 weeks with the following provider: Any provider. Additional Postpartum F/U:Postpartum Depression checkup  Low risk pregnancy complicated by: N/A Delivery mode:  Vaginal, Spontaneous  Anticipated Birth Control:  POPs   07/28/2020 Christin Fudge, CNM

## 2020-07-27 NOTE — Anesthesia Preprocedure Evaluation (Signed)
Anesthesia Evaluation  Patient identified by MRN, date of birth, ID band Patient awake    Reviewed: Allergy & Precautions, NPO status , Patient's Chart, lab work & pertinent test results  Airway Mallampati: II  TM Distance: >3 FB Neck ROM: Full    Dental no notable dental hx. (+) Teeth Intact, Dental Advisory Given   Pulmonary neg pulmonary ROS,    Pulmonary exam normal breath sounds clear to auscultation       Cardiovascular Exercise Tolerance: Good Normal cardiovascular exam Rhythm:Regular Rate:Normal     Neuro/Psych Anxiety Depression negative neurological ROS     GI/Hepatic Neg liver ROS, GERD  ,  Endo/Other    Renal/GU negative Renal ROS     Musculoskeletal Per pt "has scoliosis"   Abdominal (+) + obese,   Peds  Hematology Lab Results      Component                Value               Date                      WBC                      16.5 (H)            07/27/2020                HGB                      12.1                07/27/2020                HCT                      37.9                07/27/2020                MCV                      85.7                07/27/2020                PLT                      314                 07/27/2020              Anesthesia Other Findings   Reproductive/Obstetrics (+) Pregnancy                             Anesthesia Physical Anesthesia Plan  ASA: III  Anesthesia Plan: Epidural   Post-op Pain Management:    Induction:   PONV Risk Score and Plan:   Airway Management Planned:   Additional Equipment:   Intra-op Plan:   Post-operative Plan:   Informed Consent: I have reviewed the patients History and Physical, chart, labs and discussed the procedure including the risks, benefits and alternatives for the proposed anesthesia with the patient or authorized representative who has indicated his/her understanding and acceptance.        Plan Discussed with:   Anesthesia  Plan Comments: (40.4 G3P2 for LEA)        Anesthesia Quick Evaluation

## 2020-07-27 NOTE — MAU Note (Signed)
Pt presented to MAU with reports of ctx q5 min starting about 1930 on 4/13. Pt denies leaking of fluid and vaginal bleeding and reports good fetal movement.

## 2020-07-28 MED ORDER — CALCIUM CARBONATE ANTACID 500 MG PO CHEW
1.0000 | CHEWABLE_TABLET | Freq: Three times a day (TID) | ORAL | Status: DC | PRN
  Filled 2020-07-28: qty 1

## 2020-07-28 MED ORDER — IBUPROFEN 600 MG PO TABS: 600.0000 mg | ORAL_TABLET | Freq: Four times a day (QID) | ORAL | 0 refills | Status: AC

## 2020-07-28 MED ORDER — NORETHINDRONE 0.35 MG PO TABS: 1.0000 | ORAL_TABLET | Freq: Every day | ORAL | 11 refills | Status: DC

## 2020-07-28 MED ORDER — FAMOTIDINE 20 MG PO TABS: 10.0000 mg | ORAL_TABLET | Freq: Two times a day (BID) | ORAL | Status: DC | PRN

## 2020-07-28 NOTE — Clinical Social Work Maternal (Signed)
CLINICAL SOCIAL WORK MATERNAL/CHILD NOTE  Patient Details  Name: Jeanne Reed MRN: [id number] Date of Birth: 10/19/1997  Date:  26-May-2020  Clinical Social Worker Initiating Note:  Darra Lis, MSW, [place] Date/Time: Initiated:  07/28/20/0900     Child's Name:  Jeanne Reed   Biological Parents:  Father,Mother (Jeanne Reed)   Need for Interpreter:  None   Reason for Referral:  Current Substance Use/Substance Use During Pregnancy  ,Behavioral Health Concerns   Address:  [address] [place] [address]    Phone number:  [phone] (home)     Additional phone number:   Household Members/Support Persons (HM/SP):   Household Member/Support Person 1,Household Member/Support Person 2   HM/SP Name Relationship DOB or Age  HM/SP -1 Jeanne Reed Significant Other 03/05/1990  HM/SP -2 Jeanne Reed Daughter 09/30/2015  HM/SP -3        HM/SP -4        HM/SP -5        HM/SP -6        HM/SP -7        HM/SP -8          Natural Supports (not living in the home):  Immediate Family,Spouse/significant other   Professional Supports: None   Employment: Unemployed   Type of Work:     Education:  [place] arranged:    Museum/gallery curator Resources:  Medicaid   Other Resources:  [place] Considerations Which May Impact Care:    Strengths:  Ability to meet basic needs  ,Pediatrician chosen,Home prepared for child     Psychotropic Medications:         Pediatrician:    [employer]  Pediatrician List:   [place]      Pediatrician Fax Number:    Risk Factors/Current Problems:  Substance Use     Cognitive State:  Linear Thinking  ,Insightful  ,Alert  ,Goal Oriented     Mood/Affect:  Bright  ,Happy  ,Interested  ,Comfortable     CSW Assessment: CSW consulted for history of anxiety, depression and THC use. CSW  met with [name] to complete assessment and offer support. CSW observed infant sleeping on back, next to FOB on couch. [name] was pleasant and receptive to [place] visit. [name] declined to speak with CSW in private and reported FOB could remain in the room for the assessment. CSW informed [name] of reason for consult. [name] reported she last smoked marijuana about 6 weeks ago. [name] shared she quit smoking about a year and a half ago and now only smokes on occasion. [name] currently resides in [place], where [facility] use is legal. [name] denies any additional THC use. CSW informed [name] of the hospital drug screen policy. [name] was informed infant's UDS is negative and the CDS will be followed. CSW informed [name] that it is hospital policy for a CPS report to be made if infant test positive for substances. [name] was understanding and disclosed she had CPS history with [facility] in 2017 for [facility] use. [name] stated the case was closed and denies any additional CPS history.  CSW discussed [name] mental health diagnosis. [name] disclosed she was diagnosed with anxiety and depression as a teen. [name] expressed she is currently feeling good and had a normal pregnancy, aside from some general anxiousness. [name] reported she tried  medications in the past, but discontinued them due to not liking how they made her feel. [name] stated she also attended some therapy in [place]. [name] identified FOB and her mother as primary supports. [name] denies any current SI or HI.  [name] lives with FOB and their 30 year old. [name] receives [facility] and food stamp resources. CSW informed [name] she can contact [place] to have infant added to benefits.   CSW provided education regarding the baby blues period versus PPD. CSW provided the New Mom Checklist and encouraged [name] to self evaluate and contact a medical professional if symptoms are noted at any time.  CSW provided review of Sudden Infant Death Syndrome (SIDS) precautions. [name] reported she has all needs for infant, including a bassinet. [name]  denies any barriers to follow-up care. [name] expressed no additional needs at this time.   CSW will continue to follow CDS and make a CPS report if warranted. CSW identifies no further need for intervention and no barriers to discharge at this time.  CSW Plan/Description:  No Further Intervention Required/No Barriers to Discharge,Perinatal Mood and Anxiety Disorder (PMADs) Education,Hospital Drug Screen Policy Information,Child Protective Service Report  ,CSW Will Continue to Monitor Umbilical Cord Tissue Drug Screen Results and Make Report if Warranted,Other Information/Referral to Community Resources,Sudden Infant Death Syndrome (SIDS) Education,Other Patient/Family Education    Waylan Boga, [place] Aug 29, 2020, 10:11 AM

## 2020-07-28 NOTE — Anesthesia Postprocedure Evaluation (Signed)
Anesthesia Post Note  Patient: Jeanne Reed  Procedure(s) Performed: AN AD HOC LABOR EPIDURAL     Patient location during evaluation: Mother Baby Anesthesia Type: Epidural Level of consciousness: awake and alert, oriented and patient cooperative Pain management: pain level controlled Vital Signs Assessment: post-procedure vital signs reviewed and stable Respiratory status: spontaneous breathing Cardiovascular status: stable Postop Assessment: no headache, epidural receding, patient able to bend at knees and no signs of nausea or vomiting Anesthetic complications: no Comments: Pt. States she is walking. Pain score 2.    No complications documented.  Last Vitals:  Vitals:   07/27/20 2351 07/28/20 0456  BP: 110/64 116/67  Pulse: 87 79  Resp: 18 16  Temp: 36.8 C 36.8 C  SpO2: 98% 99%    Last Pain:  Vitals:   07/28/20 0456  TempSrc: Oral  PainSc:    Pain Goal:                   [facility]

## 2020-08-09 ENCOUNTER — Other Ambulatory Visit: Payer: Self-pay

## 2020-08-09 ENCOUNTER — Encounter: Payer: Self-pay | Admitting: Women's Health

## 2020-08-09 ENCOUNTER — Ambulatory Visit (INDEPENDENT_AMBULATORY_CARE_PROVIDER_SITE_OTHER): Payer: Medicaid Other | Admitting: Women's Health

## 2020-08-09 VITALS — BP 129/85 | HR 89 | Ht 66.0 in | Wt 201.0 lb

## 2020-08-09 DIAGNOSIS — O99345 Other mental disorders complicating the puerperium: Secondary | ICD-10-CM

## 2020-08-09 DIAGNOSIS — F53 Postpartum depression: Secondary | ICD-10-CM

## 2020-08-09 MED ORDER — SERTRALINE HCL 25 MG PO TABS: 25.0000 mg | ORAL_TABLET | Freq: Every day | ORAL | 6 refills | Status: DC

## 2020-08-09 NOTE — Progress Notes (Signed)
   GYN VISIT Patient name: Jeanne Reed MRN [id number]  Date of birth: 1990-09-11 Chief Complaint:   mood check  History of Present Illness:   Jeanne Reed is a 30 y.o. G43P2012 Caucasian female 13d s/p SVB, being seen today for mood check.   EPDS 13 (was 3 on 4/15), GAD7=13. Sleeping well, still finds joy in things she used to. No appetite, but forces herself to eat. Denies SI/HI/II. Does have h/o dep/anx. Has tried some meds in past, but caused decreased libido. Breastfeeding.  Depression screen [facility] 2/9 04/24/2020 01/13/2020 10/04/2019 05/21/2019 06/11/2017  Decreased Interest 0 1 0 3 0  Down, Depressed, Hopeless 0 0 0 2 0  PHQ - 2 Score 0 1 0 5 0  Altered sleeping 1 0 0 3 -  Tired, decreased energy 1 1 2 3  -  Change in appetite 1 0 0 0 -  Feeling bad or failure about yourself  0 0 0 2 -  Trouble concentrating 0 0 0 1 -  Moving slowly or fidgety/restless 0 0 0 0 -  Suicidal thoughts 0 0 0 0 -  PHQ-9 Score 3 2 2 14  -  Difficult doing work/chores - - - Somewhat difficult -    No LMP recorded (lmp unknown). Review of Systems:   Pertinent items are noted in HPI Denies fever/chills, dizziness, headaches, visual disturbances, fatigue, shortness of breath, chest pain, abdominal pain, vomiting, abnormal vaginal discharge/itching/odor/irritation, problems with periods, bowel movements, urination, or intercourse unless otherwise stated above.  Pertinent History Reviewed:  Reviewed past medical,surgical, social, obstetrical and family history.  Reviewed problem list, medications and allergies. Physical Assessment:   Vitals:   08/09/20 1338  BP: 129/85  Pulse: 89  Weight: 201 lb (91.2 kg)  Height: 5\' 6"  (1.676 m)  Body mass index is 32.44 kg/m.       Physical Examination:   General appearance: alert, well appearing, and in no distress  Mental status: alert, oriented to person, place, and time  Skin: warm & dry   Cardiovascular: normal heart rate noted  Respiratory: normal respiratory  effort, no distress  Abdomen: soft, non-tender   Pelvic: examination not indicated  Extremities: no edema   Chaperone: N/A    No results found for this or any previous visit (from the past 24 hour(s)).  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 sertraline (ZOLOFT) 25 MG tablet    Sig: Take 1 tablet (25 mg total) by mouth daily.    Dispense:  30 tablet    Refill:  6    Order Specific Question:   Supervising Provider    Answer:   H [[id number]]    Orders Placed This Encounter  Procedures  . Amb ref to 08/11/20    Return for As scheduled for pp visit.  CNM, [facility] 08/09/2020 4:25 PM

## 2020-08-30 ENCOUNTER — Encounter: Payer: Self-pay | Admitting: Advanced Practice Midwife

## 2020-08-30 ENCOUNTER — Other Ambulatory Visit: Payer: Self-pay

## 2020-08-30 ENCOUNTER — Ambulatory Visit (INDEPENDENT_AMBULATORY_CARE_PROVIDER_SITE_OTHER): Payer: Medicaid Other | Admitting: Advanced Practice Midwife

## 2020-08-30 ENCOUNTER — Other Ambulatory Visit (HOSPITAL_COMMUNITY)
Admission: RE | Admit: 2020-08-30 | Discharge: 2020-08-30 | Disposition: A | Discharge status: Home / Self Care | Payer: Medicaid Other | Source: Ambulatory Visit | Attending: Advanced Practice Midwife | Admitting: Advanced Practice Midwife

## 2020-08-30 DIAGNOSIS — N898 Other specified noninflammatory disorders of vagina: Secondary | ICD-10-CM | POA: Diagnosis not present

## 2020-08-30 NOTE — Progress Notes (Signed)
POSTPARTUM VISIT Patient name: Jeanne Reed MRN [id number]  Date of birth: 1990-10-08 Chief Complaint:   Postpartum Care  History of Present Illness:   Jeanne Reed is a 30 y.o. G7P2012 Caucasian female being seen today for a postpartum visit. She is 5 weeks postpartum following a spontaneous vaginal delivery at 40.4 gestational weeks. IOL: no, for n/a. Anesthesia: epidural.  Laceration: unrepaired R labial (hemostatic).  Complications: none. Inpatient contraception: no.   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 Tobacco use: no. Substance use disorder: no. Last pap smear: Feb 2021 and results were NILM w/ HRHPV not done. Next pap smear due: Feb 2024 No LMP recorded.   Reports less milk production on the R side (this happened with her daughter as well); also had fishy odor a few days ago; is having pelvic/vag heaviness and discomfort as well as self-resolving pain in her heels when walking after has been lying down.  Postpartum course has been complicated by decreased milk supply on R side only. Bleeding spotting. Bowel function is normal. Bladder function is normal. Urinary incontinence? no, fecal incontinence? no Patient is not sexually active. Last sexual activity: prior to birth of baby. Desired contraception: POPs. Patient does not know about a pregnancy in the future.  Desired family size is unsure children.   Upstream - 08/30/20 1149      Pregnancy Intention Screening   Does the patient want to become pregnant in the next year? No    Does the patient's partner want to become pregnant in the next year? No    Would the patient like to discuss contraceptive options today? No      Contraception Wrap Up   Current Method Abstinence    End Method Oral Contraceptive    Contraception Counseling Provided Yes          The pregnancy intention screening data noted above was reviewed. Potential methods of contraception were discussed. The patient elected  to proceed with Oral Contraceptive.   Edinburgh Postpartum Depression Screening: positive: H/O mental health disorder: yes depression. Currently on meds: no.  Currently in therapy: no.  Sleeping: as well as can be expected.  Appetite: good.  Still finds joy in things she used to: Yes.  Support at home: yes.  SI/HI/II: no.  Interested in medicine: No.  Interested in therapy: Yes. (has Zoloft rx filled, but is holding off on starting currently)  Edinburgh Postnatal Depression Scale - 08/30/20 1149      Edinburgh Postnatal Depression Scale:  In the Past 7 Days   I have been able to laugh and see the funny side of things. 0    I have looked forward with enjoyment to things. 0    I have blamed myself unnecessarily when things went wrong. 2    I have been anxious or worried for no good reason. 2    I have felt scared or panicky for no good reason. 2    Things have been getting on top of me. 1    I have been so unhappy that I have had difficulty sleeping. 0    I have felt sad or miserable. 1    I have been so unhappy that I have been crying. 1    The thought of harming myself has occurred to me. 0    Edinburgh Postnatal Depression Scale Total 9          Baby's course has been uncomplicated. Baby is feeding by  breast: milk supply adequate, however she feels like her R side is producing less. Infant has a pediatrician/family doctor? Yes.  Childcare strategy if returning to work/school: not discussed.  Pt has material needs met for her and baby: Yes.   Review of Systems:   Pertinent items are noted in HPI Denies Abnormal vaginal discharge w/ itching/odor/irritation, headaches, visual changes, shortness of breath, chest pain, abdominal pain, severe nausea/vomiting, or problems with urination or bowel movements. Pertinent History Reviewed:  Reviewed past medical,surgical, obstetrical and family history.  Reviewed problem list, medications and allergies. OB History  Gravida Para Term Preterm AB  Living  3 2 2   1 2   SAB IAB Ectopic Multiple Live Births  1     0 2    # Outcome Date GA Lbr Len/2nd Weight Sex Delivery Anes PTL Lv  3 Term 07/27/20 [id number]:03 / 00:16 9 lb 0.3 oz (4.09 kg) M Vag-Spont EPI  LIV  2 SAB 09/2019 867w0d       1 Term 09/30/15 [id number]:59 / 02:46 7 lb 6 oz (3.345 kg) F Vag-Spont EPI N LIV   Physical Assessment:   Vitals:   08/30/20 1147  BP: 115/72  Pulse: (!) 8  Weight: 197 lb (89.4 kg)  Body mass index is 31.8 kg/m.       Physical Examination:   General appearance: alert, well appearing, and in no distress  Mental status: alert, oriented to person, place, and time  Skin: warm & dry   Cardiovascular: normal heart rate noted   Respiratory: normal respiratory effort, no distress   Breasts: deferred, no complaints   Abdomen: soft, non-tender   Pelvic: examination not indicated. Thin prep pap obtained: No  Rectal: not examined  Extremities: Edema: none          No results found for this or any previous visit (from the past 24 hour(s)).  Assessment & Plan:  1) Postpartum exam 2) Five wks s/p spontaneous vaginal delivery 3) breast feeding: tips given for increasing production; given contact info for [facility] 4) Depression screening: mild PHQ9= 9; has appt with JamRoselyn Reefming up; has Zoloft filled but wants to wait to start 5) Contraception management: has POPs rx filled already; wants to start in the next 2 wks  6) Vag odor: CV swab sent for BV/yeast  Essential components of care per ACOG recommendations:  1.  Mood and well being:  . If positive depression screen, discussed and plan developed.  . If using tobacco we discussed reduction/cessation and risk of relapse . If current substance abuse, we discussed and referral to local resources was offered.   2. Infant care and feeding:  . If breastfeeding, discussed returning to work, pumping, breastfeeding-associated pain, guidance regarding return to fertility while lactating if not using  another method. If needed, patient was provided with a letter to be allowed to pump q 2-3hrs to support lactation in a private location with access to a refrigerator to store breastmilk.   . Recommended that all caregivers be immunized for flu, pertussis and other preventable communicable diseases . If pt does not have material needs met for her/baby, referred to local resources for help obtaining these.  3. Sexuality, contraception and birth spacing . Provided guidance regarding sexuality, management of dyspareunia, and resumption of intercourse . Discussed avoiding interpregnancy interval <6mt70mand recommended birth spacing of 18 months  4. Sleep and fatigue . Discussed coping options for fatigue and sleep disruption . Encouraged family/partner/community support  of 4 hrs of uninterrupted sleep to help with mood and fatigue  5. Physical recovery  . If pt had a C/S, assessed incisional pain and providing guidance on normal vs prolonged recovery . If pt had a laceration, perineal healing and pain reviewed.  . If urinary or fecal incontinence, discussed management and referred to PT or uro/gyn if indicated  . Patient is safe to resume physical activity. Discussed attainment of healthy weight.  6.  Chronic disease management . Discussed pregnancy complications if any, and their implications for future childbearing and long-term maternal health. . Review recommendations for prevention of recurrent pregnancy complications, such as 17 hydroxyprogesterone caproate to reduce risk for recurrent PTB not applicable, or aspirin to reduce risk of preeclampsia not applicable. . Pt had GDM: no. If yes, 2hr GTT scheduled: not applicable. . Reviewed medications and non-pregnant dosing including consideration of whether pt is breastfeeding using a reliable resource such as LactMed: yes . Referred for f/u w/ PCP or subspecialist providers as indicated: not applicable  7. Health maintenance . Mammogram at  30yo or earlier if indicated . Pap smears as indicated  Meds: No orders of the defined types were placed in this encounter.   Follow-up: Return in about 1 year (around 08/30/2021) for Physical.   No orders of the defined types were placed in this encounter.   Myrtis Ser CNM 08/30/2020 1:18 PM

## 2020-08-30 NOTE — Patient Instructions (Signed)
Tips To Increase Milk Supply  Lots of water! Enough so that your urine is clear  Plenty of calories, if you're not getting enough calories, your milk supply can decrease  Breastfeed/pump often, every 2-3 hours x 20-39mins  Fenugreek 3 pills 3 times a day, this may make your urine smell like maple syrup  Mother's Milk Tea  Lactation cookies, google for the recipe  Real oatmeal  Body Armor sports drinks  Greater Than hydration drink   [facility]  [email or web address]

## 2020-08-31 LAB — CERVICOVAGINAL ANCILLARY ONLY
Bacterial Vaginitis (gardnerella): POSITIVE — AB
Candida Glabrata: NEGATIVE
Candida Vaginitis: NEGATIVE
Comment: NEGATIVE
Comment: NEGATIVE
Comment: NEGATIVE

## 2020-09-01 ENCOUNTER — Other Ambulatory Visit: Payer: Self-pay | Admitting: Advanced Practice Midwife

## 2020-09-01 MED ORDER — METRONIDAZOLE 500 MG PO TABS: 500.0000 mg | ORAL_TABLET | Freq: Two times a day (BID) | ORAL | 0 refills | Status: DC

## 2020-09-06 ENCOUNTER — Ambulatory Visit: Payer: Self-pay

## 2020-12-26 ENCOUNTER — Other Ambulatory Visit: Payer: Self-pay | Admitting: Women's Health

## 2020-12-28 ENCOUNTER — Other Ambulatory Visit: Payer: Self-pay | Admitting: Women's Health

## 2020-12-29 ENCOUNTER — Other Ambulatory Visit: Payer: Self-pay

## 2020-12-29 ENCOUNTER — Telehealth: Payer: Self-pay | Admitting: Women's Health

## 2020-12-29 MED ORDER — DEXLANSOPRAZOLE 60 MG PO CPDR: 60.0000 mg | DELAYED_RELEASE_CAPSULE | Freq: Every day | ORAL | 0 refills | Status: AC

## 2020-12-29 NOTE — Telephone Encounter (Signed)
Pt requesting refill on Dexilant to be sent to [facility], [place]   Please advise & let pt know if it's a prior auth needed or status  Please let pt know today, meds run out tomorrow

## 2021-05-12 IMAGING — US US OB < 14 WEEKS - US OB TV
1 series · 13 of 28 positions shown · non-contrast
Comparison: None.

CLINICAL DATA: Vaginal bleeding.  Positive beta HCG

EXAM:
OBSTETRIC <14 WK US AND TRANSVAGINAL OB US
TECHNIQUE: Both transabdominal and transvaginal ultrasound examinations were
performed for complete evaluation of the gestation as well as the
maternal uterus, adnexal regions, and pelvic cul-de-sac.
Transvaginal technique was performed to assess early pregnancy.

[Series 1: us ob < 14 weeks - us ob tv · 58 acquisitions, 13 frames shown]
[im 3/58]
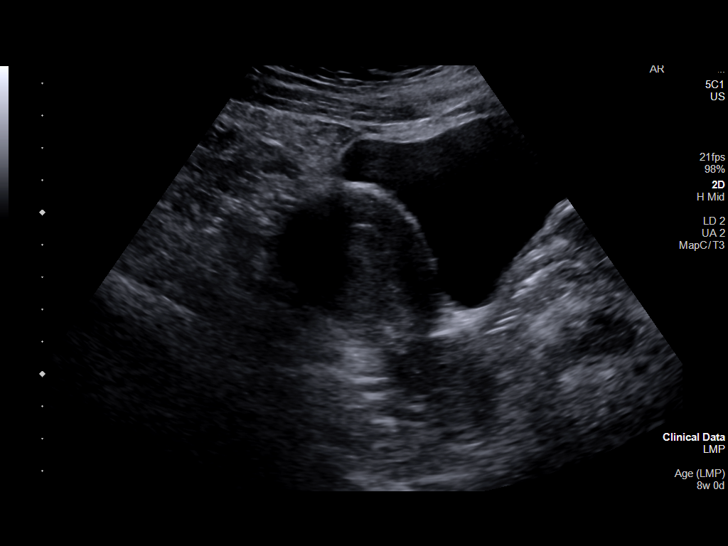
[im 7/58]
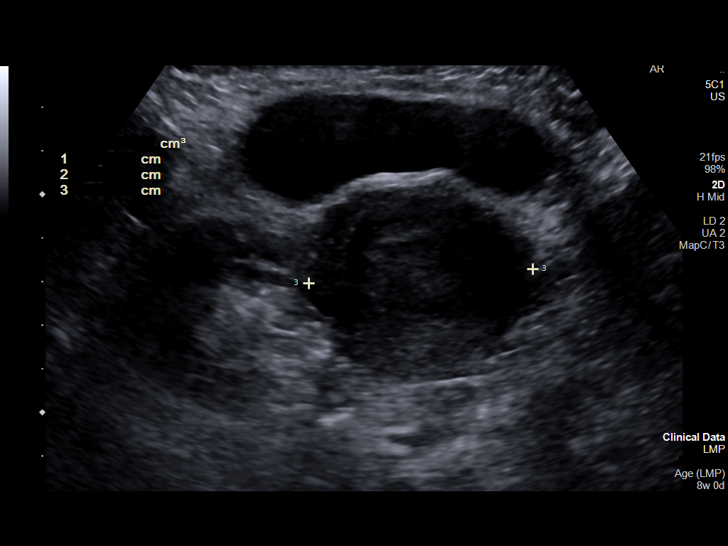
[im 11/58]
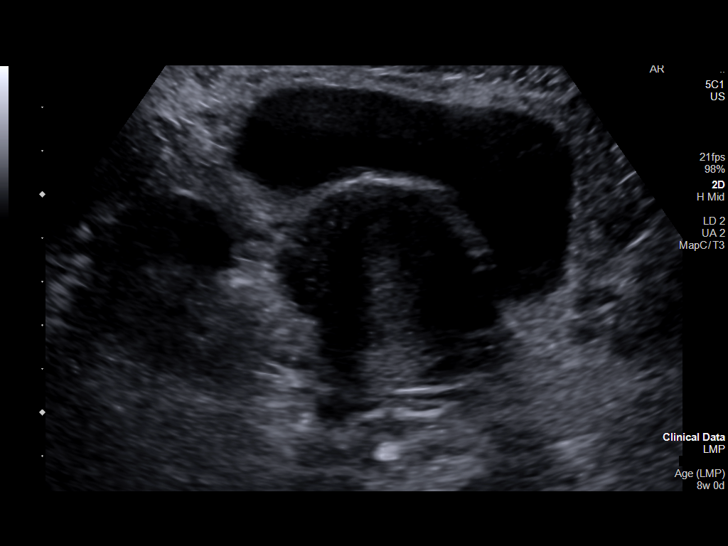
[im 15/58]
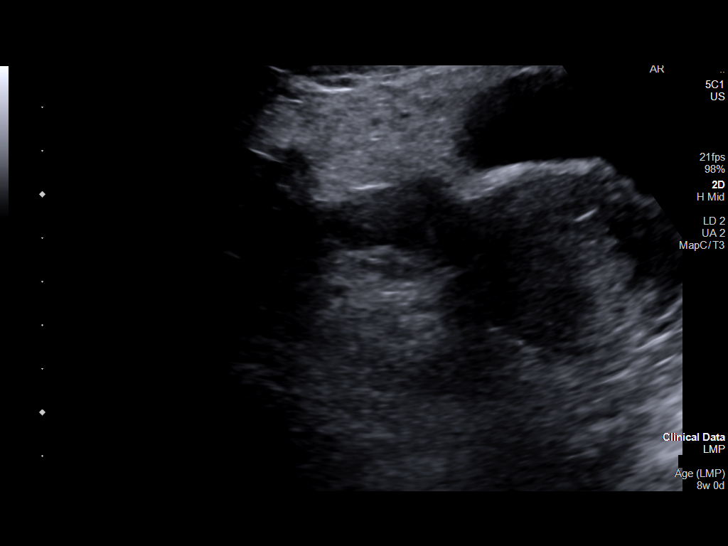
[im 20/58]
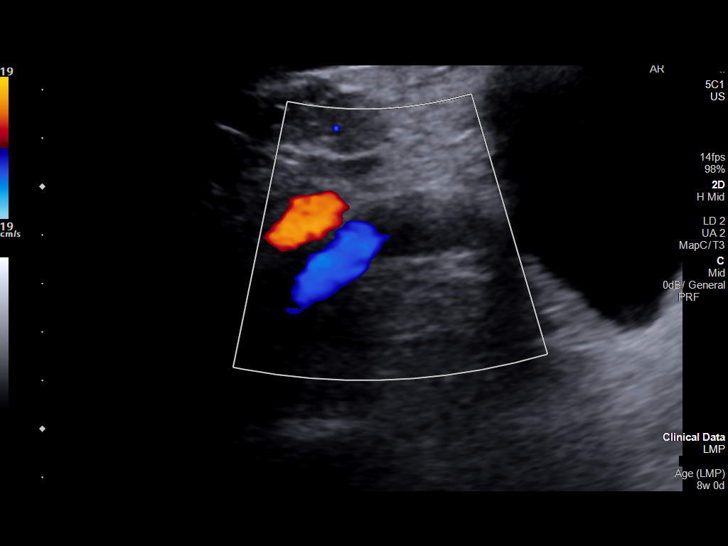
[im 24/58]
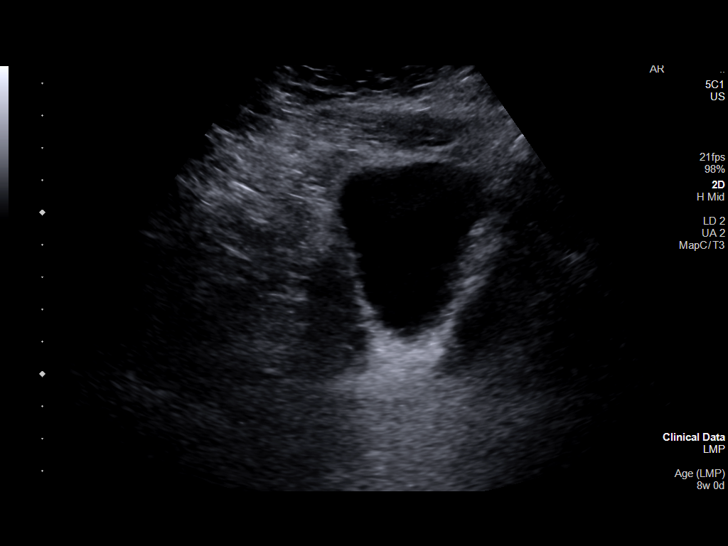
[im 30/58]
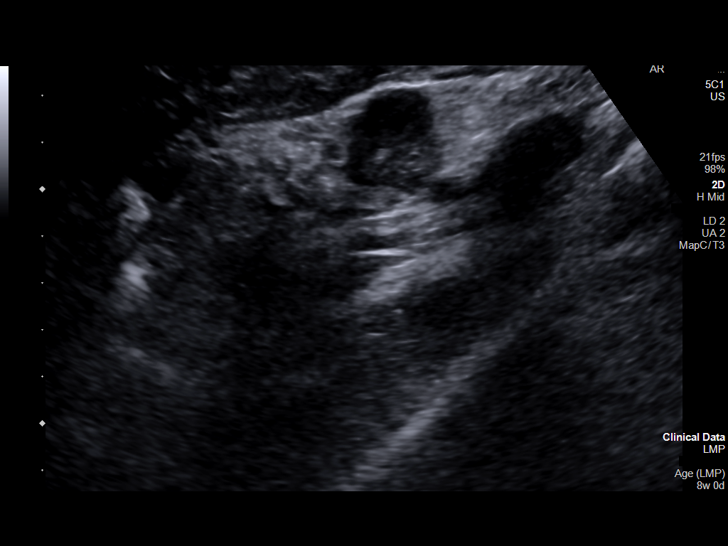
[im 34/58]
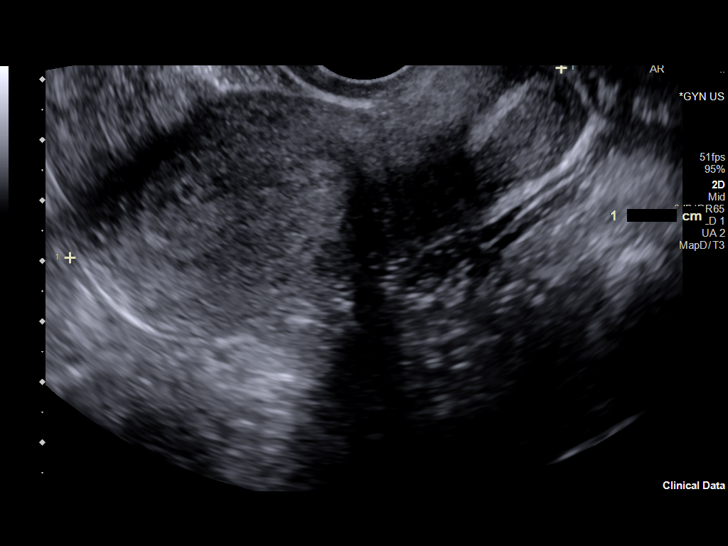
[im 39/58]
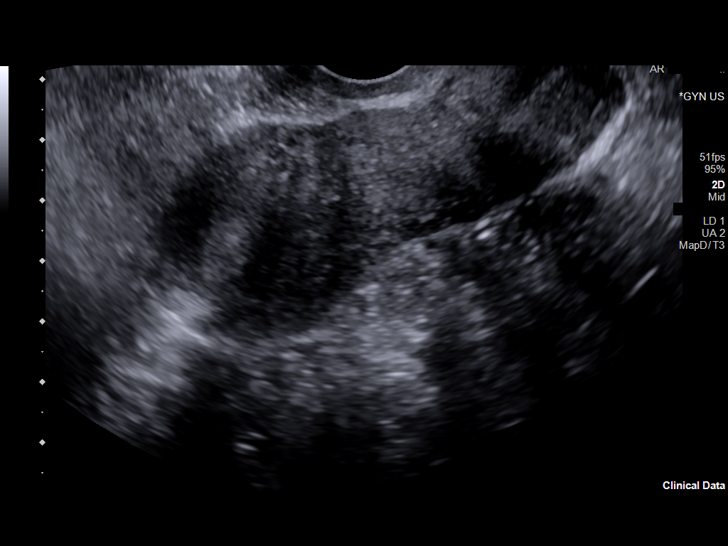
[im 43/58]
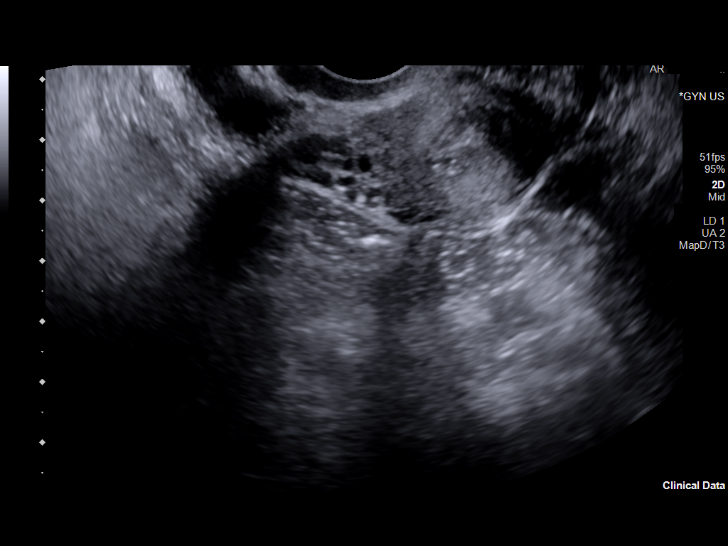
[im 47/58]
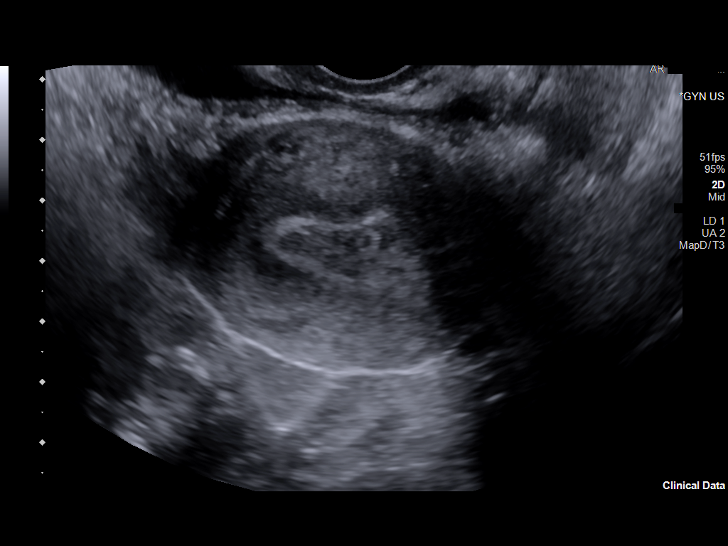
[im 51/58]
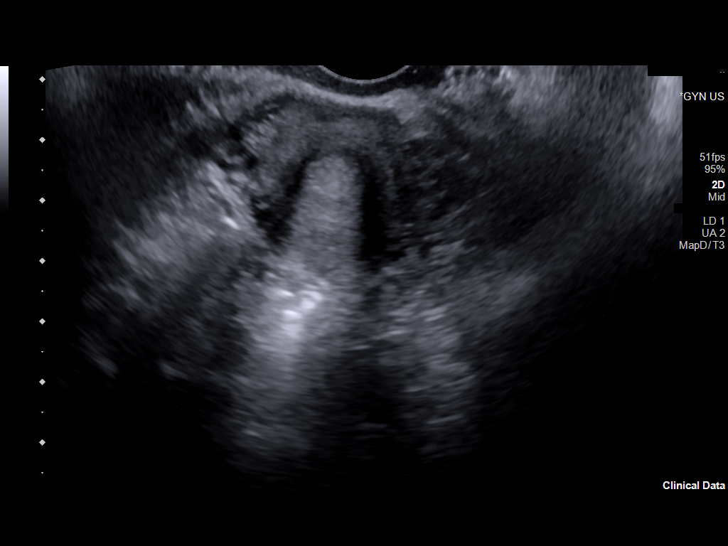
[im 55/58]
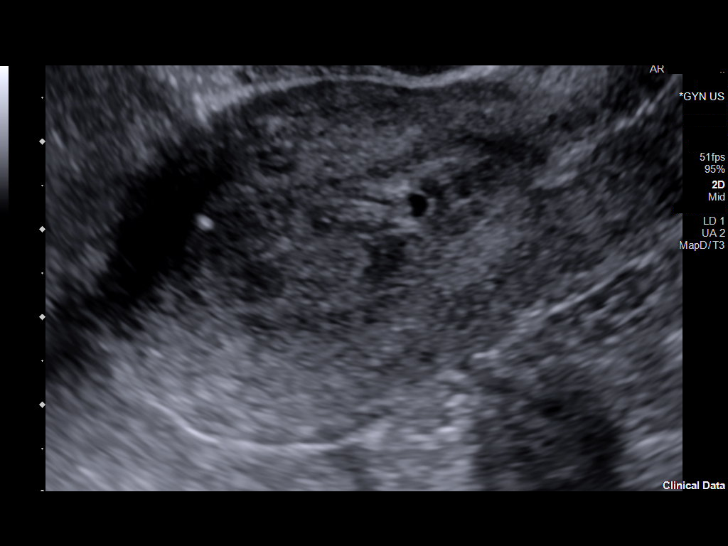

[13 of 28 positions shown; findings below may reference images not displayed]

FINDINGS: Intrauterine gestational sac: Possible.

Yolk sac:  Not Visualized.

Embryo:  Not Visualized.

Cardiac Activity: Not Visualized.

Maternal uterus/adnexae: Anteverted uterus measuring 9.8 x 4.1 x
cm. Endometrium measures 2.0 cm in thickness. Rounded 0.4 cm cystic
structure within the endometrial canal (series 1, image 34). No yolk
sac or embryo is identified. The left ovary measures 2.3 x 1.8 x
cm and appears unremarkable. The right ovary measures 3.2 x 1.6 x
3.5 cm and appears unremarkable. No free fluid within the
cul-de-sac.
IMPRESSION: Possible early intrauterine gestational sac, but no yolk sac, fetal
pole, or cardiac activity yet visualized. Recommend follow-up
quantitative B-HCG levels and follow-up US in 14 days to assess
viability. This recommendation follows SRU consensus guidelines:
Diagnostic Criteria for Nonviable Pregnancy Early in the First
Trimester. N Engl J Med 3730; [DATE].

## 2021-06-11 ENCOUNTER — Other Ambulatory Visit: Payer: Self-pay | Admitting: Women's Health

## 2022-05-02 ENCOUNTER — Ambulatory Visit
Admission: EM | Admit: 2022-05-02 | Discharge: 2022-05-02 | Disposition: A | Discharge status: Home / Self Care | Payer: Medicaid Other | Attending: Family Medicine | Admitting: Family Medicine

## 2022-05-02 DIAGNOSIS — J029 Acute pharyngitis, unspecified: Secondary | ICD-10-CM | POA: Diagnosis not present

## 2022-05-02 LAB — POCT RAPID STREP A (OFFICE): Rapid Strep A Screen: NEGATIVE

## 2022-05-02 MED ORDER — LIDOCAINE VISCOUS HCL 2 % MT SOLN: 15.0000 mL | OROMUCOSAL | 0 refills | Status: DC | PRN

## 2022-05-02 NOTE — Discharge Instructions (Addendum)
As we discussed, I suspect you have a viral sore throat.  The rapid strep throat test today is negative, throat culture is pending.  We will call you early next week if this comes back showing we need to treat with antibiotics.  In the meantime, make sure you are drinking plenty of fluids.  Lidocaine rinses may help with the throat pain.  You can start on Flonase nasal spray to help with the fluid behind your ears and help with the postnasal drainage.  You can also gargle with warm salt water, take Cepacol lozenges for the sore throat, and use Chloraseptic spray.  Continue Tylenol/ibuprofen as needed for throat pain.  If your symptoms persist or worsen despite treatment, please seek care.

## 2022-05-02 NOTE — ED Provider Notes (Signed)
[facility]    CSN: [id number] Arrival date & time: 05/02/22  1432      History   Chief Complaint No chief complaint on file.   HPI Jeanne Reed is a 32 y.o. female.   Patient presents today for 3-day history of nasal congestion, runny nose and postnasal drainage, sore throat, sinus pressure, bilateral ear popping without ear drainage, diarrhea that began today, decreased appetite, and fatigue.  Patient denies fever, cough, shortness of breath or chest pain, sneezing, headache, tooth pain, abdominal pain, nausea/vomiting, and loss of taste or smell.  Reports her daughter was recently sick with similar symptoms and strep throat has been going around the school.  Patient has been taking Advil cold/sinus for symptoms which seem to help yesterday, however not as much today.  She reports last year she had strep throat.  Denies antibiotic use in the past 90 days.  Denies allergies to antibiotic therapy.    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 Small hiatal hernia noted. Mild gastritis. Empiric dilatation.   SAVORY DILATION N/A 05/05/2018   Procedure: SAVORY DILATION;  Surgeon: Danie Binder,  MD;  Location: [facility];  Service: Endoscopy;  Laterality: N/A;   TONSILLECTOMY     WISDOM TOOTH EXTRACTION      OB History     Gravida  3   Para  2   Term  2   Preterm      AB  1   Living  2      SAB  1   IAB      Ectopic      Multiple  0   Live Births  2            Home Medications    Prior to Admission medications   Medication Sig Start Date End Date Taking? Authorizing Provider  lidocaine (XYLOCAINE) 2 % solution Use as directed 15 mLs in the mouth or throat as needed for mouth pain. 05/02/22  Yes Eulogio Bear, NP  dexlansoprazole (DEXILANT) 60 MG capsule Take 1 capsule (60 mg total) by mouth daily. 12/29/20   Roma Schanz, CNM  ibuprofen (ADVIL) 600 MG tablet Take 1 tablet (600 mg total) by mouth every 6 (six) hours. 07/28/20   Cresenzo-Dishmon, Joaquim Lai, CNM  Prenatal Vit-Fe Fumarate-FA (PRENATAL VITAMIN PO) Take by mouth.    [provider]  sertraline (ZOLOFT) 25 MG tablet Take 1 tablet (25 mg total) by mouth daily. Patient not taking: Reported on 08/30/2020 08/09/20   Roma Schanz, CNM  valACYclovir (VALTREX) 1000 MG tablet Take 1,000 mg by mouth daily.    [provider]    Family History Family History  Problem Relation Age of Onset   Hematuria Mother    Hematuria Maternal Grandmother    Cancer Maternal Grandfather        lung then brain   Diabetes Maternal Grandfather    Cancer Paternal Grandmother        liver   Colon cancer Paternal Grandmother    Breast cancer Paternal Grandmother    Emphysema Paternal Grandfather    Colon polyps Neg Hx     Social History Social History   Tobacco Use   Smoking status: Never   Smokeless tobacco: Never  Vaping Use   Vaping Use: Never used  Substance Use Topics   Alcohol use: No   Drug use: Not Currently    Types: Marijuana    Comment: stop smoking marijuana 6 months ago     Allergies   Celexa [citalopram]   Review of Systems Review of Systems Per  HPI  Physical Exam Triage Vital Signs ED Triage Vitals  Enc Vitals Group     BP 05/02/22 1445 120/82     Pulse Rate 05/02/22 1445 (!) 101     Resp 05/02/22 1445 20     Temp 05/02/22 1445 97.9 F (36.6 C)     Temp Source 05/02/22 1445 Oral     SpO2 05/02/22 1445 97 %     Weight --      Height --      Head Circumference --      Peak Flow --      Pain Score 05/02/22 1446 8     Pain Loc --      Pain Edu? --      Excl. in [place]? --    No data found.  Updated Vital Signs BP 120/82 (BP Location: Right Arm)   Pulse (!) 101   Temp 97.9 F (36.6 C) (Oral)   Resp 20   LMP 04/18/2022 (Exact Date)   SpO2 97%   Breastfeeding No   Visual Acuity Right Eye Distance:   Left Eye Distance:   Bilateral Distance:    Right Eye Near:   Left Eye Near:    Bilateral Near:     Physical Exam Vitals and nursing note reviewed.  Constitutional:      General: She is not in acute distress.    Appearance: Normal appearance. She is not ill-appearing or toxic-appearing.  HENT:     Head: Normocephalic and atraumatic.     Right Ear: Tympanic membrane, ear canal and external ear normal.     Left Ear: Tympanic membrane, ear canal and external ear normal.     Nose: Congestion and rhinorrhea present.     Mouth/Throat:     Mouth: Mucous membranes are moist.     Pharynx: Oropharynx is clear. Posterior oropharyngeal erythema present. No oropharyngeal exudate.     Tonsils: No tonsillar exudate. 2+ on the right. 2+ on the left.  Eyes:     General: No scleral icterus.    Extraocular Movements: Extraocular movements intact.  Cardiovascular:     Rate and Rhythm: Normal rate and regular rhythm.  Pulmonary:     Effort: Pulmonary effort is normal. No respiratory distress.     Breath sounds: Normal breath sounds. No wheezing, rhonchi or rales.  Abdominal:     General: Abdomen is flat. Bowel sounds are normal. There is no distension.     Palpations: Abdomen is soft.     Tenderness: There is no abdominal  tenderness.  Musculoskeletal:     Cervical  back: Normal range of motion and neck supple.  Lymphadenopathy:     Cervical: No cervical adenopathy.  Skin:    General: Skin is warm and dry.     Coloration: Skin is not jaundiced or pale.     Findings: No erythema or rash.  Neurological:     Mental Status: She is alert and oriented to person, place, and time.  Psychiatric:        Behavior: Behavior is cooperative.      UC Treatments / Results  Labs (all labs ordered are listed, but only abnormal results are displayed) Labs Reviewed  CULTURE, GROUP A STREP [facility])  POCT RAPID STREP A (OFFICE)    EKG   Radiology No results found.  Procedures Procedures (including critical care time)  Medications Ordered in UC Medications - No data to display  Initial Impression / Assessment and Plan / UC Course  I have reviewed the triage vital signs and the nursing notes.  Pertinent labs & imaging results that were available during my care of the patient were reviewed by me and considered in my medical decision making (see chart for details).   Patient is well-appearing, normotensive, afebrile, not tachypneic, oxygenating well on room air.  She is mildly tachycardic today, likely secondary to acute illness.  Acute pharyngitis, unspecified etiology Suspect viral etiology Rapid strep throat test negative, throat culture is pending COVID-19 testing deferred Supportive care discussed with patient-can start lidocaine rinses as needed for throat pain Note given for work ER and return precautions discussed with patient  The patient was given the opportunity to ask questions.  All questions answered to their satisfaction.  The patient is in agreement to this plan.   Final Clinical Impressions(s) / UC Diagnoses   Final diagnoses:  Acute pharyngitis, unspecified etiology     Discharge Instructions      As we discussed, I suspect you have a viral sore throat.  The rapid strep throat test  today is negative, throat culture is pending.  We will call you early next week if this comes back showing we need to treat with antibiotics.  In the meantime, make sure you are drinking plenty of fluids.  Lidocaine rinses may help with the throat pain.  You can start on Flonase nasal spray to help with the fluid behind your ears and help with the postnasal drainage.  You can also gargle with warm salt water, take Cepacol lozenges for the sore throat, and use Chloraseptic spray.  Continue Tylenol/ibuprofen as needed for throat pain.  If your symptoms persist or worsen despite treatment, please seek care.     ED Prescriptions     Medication Sig Dispense Auth. Provider   lidocaine (XYLOCAINE) 2 % solution Use as directed 15 mLs in the mouth or throat as needed for mouth pain. 100 mL Valentino Nose, NP      PDMP not reviewed this encounter.   Valentino Nose, NP 05/02/22 1742

## 2022-05-02 NOTE — ED Triage Notes (Signed)
Pt reports her throat is sore, bilateral ear discomfort, and she has some nasal congestion x 3 days

## 2022-05-05 LAB — CULTURE, GROUP A STREP (THRC)

## 2022-05-08 ENCOUNTER — Ambulatory Visit (INDEPENDENT_AMBULATORY_CARE_PROVIDER_SITE_OTHER): Payer: Medicaid Other

## 2022-05-08 ENCOUNTER — Other Ambulatory Visit: Payer: Self-pay

## 2022-05-08 ENCOUNTER — Ambulatory Visit
Admission: EM | Admit: 2022-05-08 | Discharge: 2022-05-08 | Disposition: A | Discharge status: Home / Self Care | Payer: Medicaid Other

## 2022-05-08 ENCOUNTER — Encounter: Payer: Self-pay | Admitting: Emergency Medicine

## 2022-05-08 DIAGNOSIS — B9689 Other specified bacterial agents as the cause of diseases classified elsewhere: Secondary | ICD-10-CM | POA: Diagnosis not present

## 2022-05-08 DIAGNOSIS — M940 Chondrocostal junction syndrome [Tietze]: Secondary | ICD-10-CM | POA: Diagnosis not present

## 2022-05-08 DIAGNOSIS — J329 Chronic sinusitis, unspecified: Secondary | ICD-10-CM | POA: Diagnosis not present

## 2022-05-08 DIAGNOSIS — R0782 Intercostal pain: Secondary | ICD-10-CM | POA: Diagnosis not present

## 2022-05-08 DIAGNOSIS — Z0389 Encounter for observation for other suspected diseases and conditions ruled out: Secondary | ICD-10-CM | POA: Diagnosis not present

## 2022-05-08 MED ORDER — AMOXICILLIN-POT CLAVULANATE 875-125 MG PO TABS: 1.0000 | ORAL_TABLET | Freq: Two times a day (BID) | ORAL | 0 refills | Status: AC

## 2022-05-08 NOTE — ED Provider Notes (Signed)
[facility]    CSN: [id number] Arrival date & time: 05/08/22  1131      History   Chief Complaint Chief Complaint  Patient presents with   Sore Throat    HPI Jeanne Reed is a 32 y.o. female.   Patient presents today for 9-day history of bodyaches, congested cough, nasal congestion and runny nose, postnasal drainage, sinus pressure in her cheeks, headache, bilateral ear popping, decreased appetite, and fatigue.  She denies shortness of breath or chest pain, tooth pain or jaw pain, ear pain, abdominal pain, nausea/vomiting, diarrhea.  No loss of taste or smell.  Reports she was seen last week and reports sore throat is better from that visit.  She was negative for strep throat at that time.  Patient reports history of sinus infection about 6 months ago.  Reports symptoms fully improved prior to that time.  Has been taking Advil Cold and Sinus and Afrin nasal spray for symptoms without much benefit.  Patient is also concerned about left-sided rib cage pain that began suddenly 4 days ago while putting her child in the car seat.  Reports she felt a pop when she was bending/reaching over.  No obvious deformity, bruising, or swelling per her report.  Reports she has had pain in her left chest ever since.  The pain is worse when she takes a deep breath.  Has been taking ibuprofen for the pain with some benefit.  Last menstrual period was earlier this month; she is confident that she is not pregnant.    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 Small hiatal hernia noted. Mild gastritis. Empiric dilatation.   SAVORY DILATION N/A 05/05/2018   Procedure: SAVORY DILATION;  Surgeon: West Bali, MD;  [facility]: [facility];  Service: Endoscopy;  Laterality: N/A;   TONSILLECTOMY     WISDOM TOOTH EXTRACTION      OB History     Gravida  3   Para  2   Term  2   Preterm      AB  1   Living  2      SAB  1   IAB      Ectopic      Multiple  0   Live Births  2            Home Medications    Prior to Admission medications   Medication Sig Start Date End Date Taking? Authorizing Provider  amoxicillin-clavulanate (AUGMENTIN) 875-125 MG tablet Take 1 tablet by mouth every 12 (twelve) hours for 5 days. 05/08/22 05/13/22 Yes Cathlean Marseilles A, NP  ARIPiprazole (ABILIFY) 2 MG tablet Take 2 mg by mouth daily.   Yes  [provider]  dexlansoprazole (DEXILANT) 60 MG capsule Take 1 capsule (60 mg total) by mouth daily. 12/29/20   Roma Schanz, CNM  ibuprofen (ADVIL) 600 MG tablet Take 1 tablet (600 mg total) by mouth every 6 (six) hours. 07/28/20   Cresenzo-Dishmon, Joaquim Lai, CNM  lidocaine (XYLOCAINE) 2 % solution Use as directed 15 mLs in the mouth or throat as needed for mouth pain. 05/02/22   Eulogio Bear, NP  Prenatal Vit-Fe Fumarate-FA (PRENATAL VITAMIN PO) Take by mouth.    [provider]  sertraline (ZOLOFT) 25 MG tablet Take 1 tablet (25 mg total) by mouth daily. 08/09/20   Roma Schanz, CNM  valACYclovir (VALTREX) 1000 MG tablet Take 1,000 mg by mouth daily.    [provider]    Family History Family History   Problem Relation Age of Onset   Hematuria Mother    Hematuria Maternal Grandmother    Cancer Maternal Grandfather        lung then brain   Diabetes Maternal Grandfather    Cancer Paternal Grandmother        liver   Colon cancer Paternal Grandmother    Breast cancer Paternal Grandmother    Emphysema Paternal Grandfather    Colon polyps Neg Hx     Social History Social History   Tobacco Use   Smoking status: Never   Smokeless tobacco: Never  Vaping Use   Vaping Use: Never used  Substance Use Topics   Alcohol use: No   Drug use: Not Currently    Types: Marijuana    Comment: stop smoking marijuana 6 months ago     Allergies   Celexa [citalopram]   Review of Systems Review of Systems Per HPI  Physical Exam Triage Vital Signs ED Triage Vitals  Enc Vitals Group     BP 05/08/22 1203 114/80     Pulse Rate 05/08/22 1203 78     Resp 05/08/22 1203 20     Temp 05/08/22 1203 98.2 F (36.8 C)     Temp Source 05/08/22 1203 Oral     SpO2 05/08/22 1203 98 %     Weight --      Height --      Head Circumference --      Peak Flow --      Pain Score 05/08/22 1201 9     Pain Loc --      Pain Edu? --      Excl. in [place]? --    No data found.  Updated Vital Signs BP 114/80 (BP Location: Right Arm)   Pulse 78   Temp 98.2 F (36.8 C) (Oral)   Resp 20   LMP 04/18/2022 (Exact Date)   SpO2 98%   Breastfeeding No   Visual Acuity Right Eye Distance:   Left Eye Distance:   Bilateral Distance:    Right Eye Near:   Left Eye Near:    Bilateral Near:     Physical Exam Vitals and nursing note reviewed.  Constitutional:      General: She is not in acute distress.    Appearance: Normal appearance. She is not ill-appearing or toxic-appearing.  HENT:     Head: Normocephalic and atraumatic.     Right Ear: Ear canal and external ear normal. A middle ear effusion is present.     Left Ear: Ear canal and external ear normal. A middle ear effusion is present.     Nose:  Congestion present. No rhinorrhea.  Right Sinus: No maxillary sinus tenderness or frontal sinus tenderness.     Left Sinus: No maxillary sinus tenderness or frontal sinus tenderness.     Comments: Positive tenderness to percussion of bilateral ethmoid sinuses    Mouth/Throat:     Mouth: Mucous membranes are moist.     Pharynx: Oropharynx is clear. Posterior oropharyngeal erythema present. No oropharyngeal exudate.  Eyes:     General: No scleral icterus.    Extraocular Movements: Extraocular movements intact.  Cardiovascular:     Rate and Rhythm: Normal rate and regular rhythm.  Pulmonary:     Effort: Pulmonary effort is normal. No respiratory distress.     Breath sounds: Normal breath sounds. No wheezing, rhonchi or rales.  Abdominal:     General: Abdomen is flat. Bowel sounds are normal. There is no distension.     Palpations: Abdomen is soft.     Tenderness: There is no abdominal tenderness. There is no guarding.  Musculoskeletal:     Cervical back: Normal range of motion and neck supple.  Lymphadenopathy:     Cervical: No cervical adenopathy.  Skin:    General: Skin is warm and dry.     Capillary Refill: Capillary refill takes less than 2 seconds.     Coloration: Skin is not jaundiced or pale.     Findings: No erythema or rash.  Neurological:     Mental Status: She is alert and oriented to person, place, and time.  Psychiatric:        Behavior: Behavior is cooperative.      UC Treatments / Results  Labs (all labs ordered are listed, but only abnormal results are displayed) Labs Reviewed - No data to display  EKG   Radiology DG Ribs Unilateral W/Chest Left  Result Date: 05/08/2022 CLINICAL DATA:  Concern for rib fracture EXAM: LEFT RIBS AND CHEST - 3+ VIEW COMPARISON:  None Available. FINDINGS: No fracture or other bone lesions are seen involving the ribs. There is no evidence of pneumothorax or pleural effusion. Both lungs are clear. Heart size and mediastinal  contours are within normal limits. IMPRESSION: No evidence of rib fracture.  No thoracic trauma. Electronically Signed   By: Genevive Bi M.D.   On: 05/08/2022 12:36    Procedures Procedures (including critical care time)  Medications Ordered in UC Medications - No data to display  Initial Impression / Assessment and Plan / UC Course  I have reviewed the triage vital signs and the nursing notes.  Pertinent labs & imaging results that were available during my care of the patient were reviewed by me and considered in my medical decision making (see chart for details).   Patient is well-appearing, normotensive, afebrile, not tachycardic, not tachypneic, oxygenating well on room air.    Bacterial sinusitis Treat with Augmentin twice daily for 5 days Supportive care discussed ER and return precautions also discussed  Costochondritis Rib x-ray today negative for acute bony abnormality Supportive care discussed with patient Recommended use of Tylenol/NSAIDs and heat/ice as needed for pain, encouraged deep breathing to prevent atelectasis ER and return precautions also discussed  The patient was given the opportunity to ask questions.  All questions answered to their satisfaction.  The patient is in agreement to this plan.    Final Clinical Impressions(s) / UC Diagnoses   Final diagnoses:  Bacterial sinusitis  Costochondritis     Discharge Instructions      I suspect you have a bacterial sinus infection.  Take the Augmentin  as prescribed to treat it. Also take guaifenesin 600 mg twice daily and use nasal saline rinses to help clean out sinuses.    The rib x-ray today does not show any broken bones.  I suspect the small muscles/cartilage in your rib cage are inflamed.  This pain may last for a few weeks to a couple of months.  Start ibuprofen 600 mg every 8 hours alternating with Tylenol [phone] mg every 6 hours as needed for pain.  You can also use a warm compress or ice to the  area to help with pain.  Start light stretching/range of motion exercises.  Seek care if pain worsens or does not improve after a few weeks.     ED Prescriptions     Medication Sig Dispense Auth. Provider   amoxicillin-clavulanate (AUGMENTIN) 875-125 MG tablet Take 1 tablet by mouth every 12 (twelve) hours for 5 days. 10 tablet Eulogio Bear, NP      PDMP not reviewed this encounter.   Eulogio Bear, NP 05/08/22 1310

## 2022-05-08 NOTE — Discharge Instructions (Signed)
I suspect you have a bacterial sinus infection.  Take the Augmentin as prescribed to treat it. Also take guaifenesin 600 mg twice daily and use nasal saline rinses to help clean out sinuses.    The rib x-ray today does not show any broken bones.  I suspect the small muscles/cartilage in your rib cage are inflamed.  This pain may last for a few weeks to a couple of months.  Start ibuprofen 600 mg every 8 hours alternating with Tylenol [phone] mg every 6 hours as needed for pain.  You can also use a warm compress or ice to the area to help with pain.  Start light stretching/range of motion exercises.  Seek care if pain worsens or does not improve after a few weeks.

## 2022-05-08 NOTE — ED Triage Notes (Signed)
Pt reports seen for same last week and reports continued sore throat and cough and reports "I think its turned into a sinus infection." Pt also reports intermittent chest discomfort after feeling something "pop"  in chest when leaning over to place son in car seat on Saturday.

## 2022-05-17 ENCOUNTER — Ambulatory Visit: Payer: Medicaid Other | Admitting: Family Medicine

## 2022-07-09 ENCOUNTER — Ambulatory Visit: Payer: Medicaid Other | Admitting: Family Medicine

## 2022-07-29 DIAGNOSIS — R030 Elevated blood-pressure reading, without diagnosis of hypertension: Secondary | ICD-10-CM | POA: Diagnosis not present

## 2022-07-29 DIAGNOSIS — F32A Depression, unspecified: Secondary | ICD-10-CM | POA: Diagnosis not present

## 2022-07-29 DIAGNOSIS — F3181 Bipolar II disorder: Secondary | ICD-10-CM | POA: Diagnosis not present

## 2022-07-29 DIAGNOSIS — B009 Herpesviral infection, unspecified: Secondary | ICD-10-CM | POA: Diagnosis not present

## 2022-07-29 DIAGNOSIS — Z7689 Persons encountering health services in other specified circumstances: Secondary | ICD-10-CM | POA: Diagnosis not present

## 2022-07-29 DIAGNOSIS — F419 Anxiety disorder, unspecified: Secondary | ICD-10-CM | POA: Diagnosis not present

## 2022-07-29 DIAGNOSIS — K219 Gastro-esophageal reflux disease without esophagitis: Secondary | ICD-10-CM | POA: Diagnosis not present

## 2022-07-29 DIAGNOSIS — Z6826 Body mass index (BMI) 26.0-26.9, adult: Secondary | ICD-10-CM | POA: Diagnosis not present

## 2022-09-16 ENCOUNTER — Ambulatory Visit: Payer: Medicaid Other | Admitting: Family Medicine

## 2024-02-05 ENCOUNTER — Encounter: Payer: Self-pay | Admitting: Gastroenterology

## 2024-03-17 ENCOUNTER — Telehealth (INDEPENDENT_AMBULATORY_CARE_PROVIDER_SITE_OTHER): Payer: Self-pay | Admitting: Gastroenterology

## 2024-03-17 ENCOUNTER — Encounter: Payer: Self-pay | Admitting: Gastroenterology

## 2024-03-17 ENCOUNTER — Ambulatory Visit: Admitting: Gastroenterology

## 2024-03-17 VITALS — BP 109/69 | HR 98 | Temp 98.0°F | Ht 66.0 in | Wt 165.3 lb

## 2024-03-17 DIAGNOSIS — K5900 Constipation, unspecified: Secondary | ICD-10-CM | POA: Diagnosis not present

## 2024-03-17 DIAGNOSIS — R110 Nausea: Secondary | ICD-10-CM

## 2024-03-17 DIAGNOSIS — K219 Gastro-esophageal reflux disease without esophagitis: Secondary | ICD-10-CM

## 2024-03-17 MED ORDER — ONDANSETRON HCL 4 MG PO TABS: 4.0000 mg | ORAL_TABLET | Freq: Three times a day (TID) | ORAL | 2 refills | Status: AC | PRN

## 2024-03-17 MED ORDER — LINACLOTIDE 72 MCG PO CAPS: 72.0000 ug | ORAL_CAPSULE | Freq: Every day | ORAL | Status: AC

## 2024-03-17 NOTE — Patient Instructions (Signed)
 Continue Dexilant  daily. I am talking with Dr. Cinderella about the TIF procedure for reflux. You will need an upper endoscopy, but I am checking on any other studies we need to arrange also.   Limit Ibuprofen , Advil , Aleve , anything NSAIDs category as this can cause inflammation, ulcers, worsening symptoms.  I have sent in Zofran  to take as needed every 8 hours for nausea. I would avoid vape right now.   For constipation: you can try Benefiber 2 teaspoons daily and increase water intake. If not helpful, I have given you samples of low dose Linzess to try. Take this first thing in morning on an empty stomach at least 30 minutes before breakfast to avoid looser stool.  Usually there is a washout.  For about 5 to 7 days where your stools are looser but this should get better.  If not let us  know.  We can always increase the dosage if needed.  Let us  know if you like this and we can send that to the pharmacy.  I recommend starting this on a day that you are not working.  I will be in touch regarding arranging the procedures after talking further with Dr. Cinderella!  Have a great holiday season!  I enjoyed seeing you again today! I value our relationship and want to provide genuine, compassionate, and quality care. You may receive a survey regarding your visit with me, and I welcome your feedback! Thanks so much for taking the time to complete this. I look forward to seeing you again.      Therisa MICAEL Stager, PhD, ANP-BC [facility]

## 2024-03-17 NOTE — Progress Notes (Signed)
 "   Gastroenterology Office Note     Primary Care Physician:  Trudy Vaughn FALCON, MD  Primary Gastroenterologist: Dr Cindie   Chief Complaint   Chief Complaint  Patient presents with   Follow-up    Patient here today, due to having issues with GERD. She is currently having break through symptoms on Dexlansoprazole  60 mg once per day.     History of Present Illness   Jeanne Reed is a 33 y.o. female presenting today with a history of chronic GERD, intermittent dysphagia, chronic nausea, . She was last seen virtually in 2020.   Dexilant  works better than other medications she has taken historically.    Was off Dexilant  for almost 2 years as insurance wouldn't cover this and had to take 2 omeprazole  in the mornings. Had breakthrough GERD at this time. Has been back on Dexilant  for 2 months now and getting better. Still breakthrough but getting better. Breakthrough now once a week but prior to this was daily. Used to wake up vomiting. No dysphagia. Waves of nausea intermittently. No vomiting in a few weeks. Had vomiting twice when getting back on Dexilant . Marijuana daily, uses vape pen, helps with nausea.   Occasional abdominal discomfort located in LLQ, usually when feeling more sluggish. Chronic constipation. When 16 did what sounds like gastric emptying study and was slow. BM daily. Sometimes not as productive and complete emptying. Goes 3-4 times in morning before even leaving work, varies in consistency. No rectal bleeding.   Not drinking a lot of water. 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 Drives [employer]   EGD Jan 2020: small hiatal hernia, mild gastritis, empiric dilation   Paternal grandmother: colon cancer No known colon polyps Mother: hemorrhoids No IBD or celiac disease  Past Medical History:  Diagnosis Date   Acid reflux    Anxiety    Depression    GERD (gastroesophageal reflux disease)    Nausea and vomiting during pregnancy  02/08/2015    Past Surgical History:  Procedure Laterality Date   BIOPSY  05/05/2018   Procedure: BIOPSY;  Surgeon: Harvey Margo CROME, MD;  [facility]: [facility];  Service: Endoscopy;;  gastric esophagus   ESOPHAGOGASTRODUODENOSCOPY (EGD) WITH PROPOFOL  N/A 05/05/2018   Dysphagia due to uncontrolled GERD. Small hiatal hernia noted. Mild gastritis. Empiric dilatation.   SAVORY DILATION N/A 05/05/2018   Procedure: SAVORY DILATION;  Surgeon: Harvey Margo CROME, MD;  [facility];  Service: Endoscopy;  Laterality: N/A;   TONSILLECTOMY     WISDOM TOOTH EXTRACTION      Current Outpatient Medications  Medication Sig Dispense Refill   dexlansoprazole  (DEXILANT ) 60 MG capsule Take 1 capsule (60 mg total) by mouth daily. 30 capsule 0   ibuprofen  (ADVIL ) 600 MG tablet Take 1 tablet (600 mg total) by mouth every 6 (six) hours. (Patient taking differently: Take 600 mg by mouth as needed.) 30 tablet 0   valACYclovir  (VALTREX ) 1000 MG tablet Take 1,000 mg by mouth daily.     No current facility-administered medications for this visit.    Allergies as of 03/17/2024 - Review Complete 03/17/2024  Allergen Reaction Noted   Celexa  [citalopram ] Other (See Comments) 06/18/2019    Family History  Problem Relation Age of Onset   Hematuria Mother    Hematuria Maternal Grandmother    Cancer Maternal Grandfather        lung then brain   Diabetes Maternal Grandfather    Cancer  Paternal Grandmother        liver   Colon cancer Paternal Grandmother    Breast cancer Paternal Grandmother    Emphysema Paternal Grandfather    Colon polyps Neg Hx     Social History   Socioeconomic History   Marital status: Single    Spouse name: Not on file   Number of children: 1   Years of education: Not on file   Highest education level: Not on file  Occupational History   Occupation: [employer]    Employer: [employer]  Tobacco Use   Smoking status: Never   Smokeless tobacco: Never  Vaping Use    Vaping status: Never Used  Substance and Sexual Activity   Alcohol use: No   Drug use: Yes    Types: Marijuana    Comment: stop smoking marijuana 6 months ago   Sexual activity: Not Currently    Birth control/protection: None  Other Topics Concern   Not on file  Social History Narrative   Two-year-old girl, Callie.    Social Drivers of Corporate Investment Banker Strain: Low Risk  (04/24/2020)   Overall Financial Resource Strain (CARDIA)    Difficulty of Paying Living Expenses: Not very hard  Food Insecurity: No Food Insecurity (04/24/2020)   Hunger Vital Sign    Worried About Running Out of Food in the Last Year: Never true    Ran Out of Food in the Last Year: Never true  Transportation Needs: No Transportation Needs (04/24/2020)   PRAPARE - Administrator, Civil Service (Medical): No    Lack of Transportation (Non-Medical): No  Physical Activity: Insufficiently Active (04/24/2020)   Exercise Vital Sign    Days of Exercise per Week: 1 day    Minutes of Exercise per Session: 20 min  Stress: No Stress Concern Present (04/24/2020)   [employer] of Occupational Health - Occupational Stress Questionnaire    Feeling of Stress : Only a little  Social Connections: Moderately Isolated (04/24/2020)   Social Connection and Isolation Panel    Frequency of Communication with Friends and Family: More than three times a week    Frequency of Social Gatherings with Friends and Family: Twice a week    Attends Religious Services: Never    Database Administrator or Organizations: No    Attends Banker Meetings: Never    Marital Status: Living with partner  Intimate Partner Violence: Not At Risk (04/24/2020)   Humiliation, Afraid, Rape, and Kick questionnaire    Fear of Current or Ex-Partner: No    Emotionally Abused: No    Physically Abused: No    Sexually Abused: No     Review of Systems   Gen: Denies any fever, chills, fatigue, weight loss, lack of appetite.   CV: Denies chest pain, heart palpitations, peripheral edema, syncope.  Resp: Denies shortness of breath at rest or with exertion. Denies wheezing or cough.  GI: Denies dysphagia or odynophagia. Denies jaundice, hematemesis, fecal incontinence. GU : Denies urinary burning, urinary frequency, urinary hesitancy MS: Denies joint pain, muscle weakness, cramps, or limitation of movement.  Derm: Denies rash, itching, dry skin Psych: Denies depression, anxiety, memory loss, and confusion Heme: Denies bruising, bleeding, and enlarged lymph nodes.   Physical Exam   BP 109/69 (BP Location: Left Arm, Patient Position: Sitting, Cuff Size: Normal)   Pulse 98   Temp 98 F (36.7 C) (Temporal)   Ht 5' 6 (1.676 m)   Wt 165  lb 4.8 oz (75 kg)   BMI 26.68 kg/m  General:   Alert and oriented. Pleasant and cooperative. Well-nourished and well-developed.  Head:  Normocephalic and atraumatic. Eyes:  Without icterus Abdomen:  +BS, soft, non-tender and non-distended. No HSM noted. No guarding or rebound. No masses appreciated.  Rectal:  Deferred  Msk:  Symmetrical without gross deformities. Normal posture. Extremities:  Without edema. Neurologic:  Alert and  oriented x4;  grossly normal neurologically. Skin:  Intact without significant lesions or rashes. Psych:  Alert and cooperative. Normal mood and affect.   Assessment   Jeanne Reed is a 33 y.o. female presenting today with a history of chronic GERD, intermittent dysphagia, chronic nausea, last seen virtually in 2020.  GERD: historically has worked best on Dexilant  and since resuming this, she has noted improvement in breakthrough symptoms. Still with GERD flares despite diet changes. Intermittent nausea noted, query if due to uncontrolled GERD but unable to rule out delayed gastric emptying. Notably, she reports what sounds like a GES when a teenager that was reportedly abnormal.   Chronic nausea: tolerating diet. May need GES. Notes improvement  with marijuana. Nausea is usually improved if GERD under better control as well.   Constipation: add Benefiber to help with regimen and stool consistency/formation, and start Linzess  72 mcg daily. May benefit from [facility] in future.      PLAN    Continue Dexilant  daily.  I will review  candidacy for TIF procedure.  She would likely need an upper endoscopy.  Unclear if she will need GES as well.  Start Benefiber daily, add Linzess  72 mcg daily if needed.  Samples have been provided. Zofran  as needed nausea sent to pharmacy Avoid NSAIDs moving forward.   Therisa MICAEL Stager, PhD, ANP-BC [facility]    "

## 2024-03-17 NOTE — Telephone Encounter (Signed)
 Medication Samples have been provided to the patient.  Drug name: Linzess       Strength: 72mcg        Qty: 3 boxes   LOT: [id number]  Exp.Date: 01/28  Dosing instructions: take one capsule po daily  The patient has been instructed regarding the correct time, dose, and frequency of taking this medication, including desired effects and most common side effects.   Glenys Bruns 10:41 AM 03/17/2024
